# Patient Record
Sex: Male | Born: 1955 | Race: White | Hispanic: No | Smoking: Current every day smoker
Health system: Southern US, Community
[De-identification: ages and names within clinical notes are randomized; demographics above are authoritative.]

## PROBLEM LIST (undated history)

## (undated) DIAGNOSIS — B182 Chronic viral hepatitis C: Secondary | ICD-10-CM

## (undated) DIAGNOSIS — F329 Major depressive disorder, single episode, unspecified: Secondary | ICD-10-CM

## (undated) DIAGNOSIS — F419 Anxiety disorder, unspecified: Secondary | ICD-10-CM

## (undated) DIAGNOSIS — F32A Depression, unspecified: Secondary | ICD-10-CM

## (undated) DIAGNOSIS — H9190 Unspecified hearing loss, unspecified ear: Secondary | ICD-10-CM

## (undated) HISTORY — PX: NECK SURGERY: SHX720

---

## 2010-01-02 ENCOUNTER — Inpatient Hospital Stay (HOSPITAL_COMMUNITY): Admission: RE | Admit: 2010-01-02 | Discharge: 2010-01-03 | Payer: Self-pay | Admitting: Neurosurgery

## 2010-12-19 LAB — CBC
MCHC: 34.7 g/dL (ref 30.0–36.0)
MCV: 94.5 fL (ref 78.0–100.0)
Platelets: 270 10*3/uL (ref 150–400)
RBC: 4.32 MIL/uL (ref 4.22–5.81)
RDW: 12.7 % (ref 11.5–15.5)

## 2010-12-19 LAB — SURGICAL PCR SCREEN: MRSA, PCR: NEGATIVE

## 2012-11-08 ENCOUNTER — Emergency Department (HOSPITAL_BASED_OUTPATIENT_CLINIC_OR_DEPARTMENT_OTHER): Payer: Medicare Other

## 2012-11-08 ENCOUNTER — Emergency Department (HOSPITAL_BASED_OUTPATIENT_CLINIC_OR_DEPARTMENT_OTHER)
Admission: EM | Admit: 2012-11-08 | Discharge: 2012-11-08 | Disposition: A | Discharge status: Home / Self Care | Payer: Medicare Other | Attending: Emergency Medicine | Admitting: Emergency Medicine

## 2012-11-08 ENCOUNTER — Encounter (HOSPITAL_BASED_OUTPATIENT_CLINIC_OR_DEPARTMENT_OTHER): Payer: Self-pay | Admitting: *Deleted

## 2012-11-08 DIAGNOSIS — M542 Cervicalgia: Secondary | ICD-10-CM

## 2012-11-08 DIAGNOSIS — F17200 Nicotine dependence, unspecified, uncomplicated: Secondary | ICD-10-CM | POA: Insufficient documentation

## 2012-11-08 DIAGNOSIS — Z79899 Other long term (current) drug therapy: Secondary | ICD-10-CM | POA: Insufficient documentation

## 2012-11-08 DIAGNOSIS — H913 Deaf nonspeaking, not elsewhere classified: Secondary | ICD-10-CM | POA: Insufficient documentation

## 2012-11-08 DIAGNOSIS — G8929 Other chronic pain: Secondary | ICD-10-CM | POA: Insufficient documentation

## 2012-11-08 HISTORY — DX: Unspecified hearing loss, unspecified ear: H91.90

## 2012-11-08 MED ORDER — OXYCODONE-ACETAMINOPHEN 5-325 MG PO TABS: 2.0000 | ORAL_TABLET | ORAL | Status: DC | PRN

## 2012-11-08 MED ORDER — OXYCODONE-ACETAMINOPHEN 5-325 MG PO TABS
2.0000 | ORAL_TABLET | Freq: Once | ORAL | Status: AC
  Administered 2012-11-08: 2 via ORAL
  Filled 2012-11-08 (×2): qty 2

## 2012-11-08 NOTE — ED Notes (Signed)
Call from Nicoletta Dress requesting  Information as to fathers condition requesting return call at [phone]

## 2012-11-08 NOTE — ED Provider Notes (Signed)
History  This chart was scribed for Richardean Canal, MD by Shari Heritage, ED Scribe. The patient was seen in room MH06/MH06. Patient's care was started at 1942.   CSN: [id number]  Arrival date & time 11/08/12  4098   First MD Initiated Contact with Patient 11/08/12 1942      Chief Complaint  Patient presents with  . Neck Pain     The history is provided by the patient. The history is limited by the condition of the patient. No language interpreter was used.    HPI Comments - Level 5 Caveat, Unable to complete full history because patient is nonverbal: Lee [name] is a 57 y.o. male who presents to the Emergency Department complaining of moderate to severe, constant neck pain. Patient has a history of neck surgery and has had chronic pain since 2011. He has come to the ED now because he is out of Percocet and hydrocodone.   Past Medical History  Diagnosis Date  . Deaf     Past Surgical History  Procedure Laterality Date  . Neck surgery      History reviewed. No pertinent family history.  History  Substance Use Topics  . Smoking status: Current Every Day Smoker  . Smokeless tobacco: Not on file  . Alcohol Use: No      Review of Systems  Unable to perform ROS: Patient nonverbal    Allergies  Review of patient's allergies indicates no known allergies.  Home Medications   Current Outpatient Rx  Name  Route  Sig  Dispense  Refill  . ALPRAZolam (XANAX PO)   Oral   Take by mouth.         Marland Kitchen HYDROcodone-acetaminophen (NORCO/VICODIN) 5-325 MG per tablet   Oral   Take 1 tablet by mouth every 6 (six) hours as needed for pain.         Marland Kitchen oxyCODONE-acetaminophen (PERCOCET/ROXICET) 5-325 MG per tablet   Oral   Take 1 tablet by mouth every 4 (four) hours as needed for pain.           Triage Vitals: BP 125/93  Pulse 76  Temp(Src) 97.7 F (36.5 C) (Oral)  Resp 20  SpO2 96%  Physical Exam  Constitutional: He is oriented to person, place, and time. He appears  well-developed and well-nourished.  HENT:  Head: Normocephalic and atraumatic.  Eyes: Conjunctivae and EOM are normal. Pupils are equal, round, and reactive to light.  Neck: Normal range of motion. Neck supple.  No meningismus.   Cardiovascular: Normal rate, regular rhythm and normal heart sounds.   Pulmonary/Chest: Effort normal and breath sounds normal.  Musculoskeletal: Normal range of motion.  Moving all extremities without difficulty.  Neurological: He is alert and oriented to person, place, and time. Coordination normal.  Strength and sensation are intact.  Skin: Skin is warm and dry.  Psychiatric: He has a normal mood and affect. His behavior is normal.    ED Course  Procedures (including critical care time) DIAGNOSTIC STUDIES: Oxygen Saturation is 96% on room air, adequate by my interpretation.    COORDINATION OF CARE: 7:47 PM- Patient informed of current plan for treatment and evaluation and agrees with plan at this time.   8:00 PM- Ordered 2 tablets of Percocet 5-325 mg.  9:17 PM- Updated patient on imaging results. Will discharge with prescription for Percocet 5-325 mg.  Imaging Studies: Ct Cervical Spine Wo Contrast  11/08/2012  *RADIOLOGY REPORT*  Clinical Data: Neck pain.  CT CERVICAL SPINE  WITHOUT CONTRAST  Technique:  Multidetector CT imaging of the cervical spine was performed. Multiplanar CT image reconstructions were also generated.  Comparison: 04/21/2011  Findings: Spurring noted at the anterior C1-2 articulation.  Facet spurring noted bilaterally at C2-3 with degenerative facet arthropathy on the right at C7-T1.  Solid bony interbody fusion noted at C5-6 with anterior plate screw fixator.  Mild uncinate spurring noted bilaterally at C3-4 and C4-5, potentially contributing to mild osseous foraminal stenosis on the left at C3-4.  There is 1.5 mm of anterior subluxation of C7-T1.  This is thought to be degenerative.  No cervical spine fracture or acute subluxation  identified.  IMPRESSION:  1.  Cervical spondylosis without fracture or acute subluxation. 2.  Solid bony interbody fusion at C5-6. 3.  1.5 mm of degenerative anterior subluxation at C 71.   Original Report Authenticated By: Gaylyn Rong, M.D.      1. Neck pain       MDM  Lee [name] is a 57 y.o. male here with neck pain with radiation down to both arms. History difficult because he is deaf but he writes well. Nonfocal neuro exam. Felt better after percocet. CT neck showed no fracture or subluxation. Will d/c home with pain meds. He will f/u with PMD.    I personally performed the services described in this documentation, which was scribed in my presence. The recorded information has been reviewed and is accurate.    Richardean Canal, MD 11/08/12 2122

## 2012-11-08 NOTE — ED Notes (Signed)
Pt had neck surgery in 2011. Woke up a couple of days ago and his neck was hurting. Radiating down both arms.

## 2012-12-22 ENCOUNTER — Ambulatory Visit: Payer: Medicare Other | Admitting: Internal Medicine

## 2012-12-22 DIAGNOSIS — Z0289 Encounter for other administrative examinations: Secondary | ICD-10-CM

## 2014-10-17 ENCOUNTER — Emergency Department (HOSPITAL_BASED_OUTPATIENT_CLINIC_OR_DEPARTMENT_OTHER): Payer: PPO

## 2014-10-17 ENCOUNTER — Emergency Department (HOSPITAL_BASED_OUTPATIENT_CLINIC_OR_DEPARTMENT_OTHER)
Admission: EM | Admit: 2014-10-17 | Discharge: 2014-10-17 | Disposition: A | Discharge status: Home / Self Care | Payer: PPO | Attending: Emergency Medicine | Admitting: Emergency Medicine

## 2014-10-17 ENCOUNTER — Encounter (HOSPITAL_BASED_OUTPATIENT_CLINIC_OR_DEPARTMENT_OTHER): Payer: Self-pay

## 2014-10-17 DIAGNOSIS — S3992XA Unspecified injury of lower back, initial encounter: Secondary | ICD-10-CM | POA: Diagnosis not present

## 2014-10-17 DIAGNOSIS — M549 Dorsalgia, unspecified: Secondary | ICD-10-CM

## 2014-10-17 DIAGNOSIS — Z720 Tobacco use: Secondary | ICD-10-CM | POA: Insufficient documentation

## 2014-10-17 DIAGNOSIS — H9190 Unspecified hearing loss, unspecified ear: Secondary | ICD-10-CM | POA: Insufficient documentation

## 2014-10-17 DIAGNOSIS — Y9289 Other specified places as the place of occurrence of the external cause: Secondary | ICD-10-CM | POA: Insufficient documentation

## 2014-10-17 DIAGNOSIS — Z79899 Other long term (current) drug therapy: Secondary | ICD-10-CM | POA: Diagnosis not present

## 2014-10-17 DIAGNOSIS — G8929 Other chronic pain: Secondary | ICD-10-CM | POA: Insufficient documentation

## 2014-10-17 DIAGNOSIS — X58XXXA Exposure to other specified factors, initial encounter: Secondary | ICD-10-CM | POA: Diagnosis not present

## 2014-10-17 DIAGNOSIS — Y998 Other external cause status: Secondary | ICD-10-CM | POA: Insufficient documentation

## 2014-10-17 DIAGNOSIS — Y9389 Activity, other specified: Secondary | ICD-10-CM | POA: Insufficient documentation

## 2014-10-17 MED ORDER — OXYCODONE-ACETAMINOPHEN 5-325 MG PO TABS: 1.0000 | ORAL_TABLET | Freq: Four times a day (QID) | ORAL | Status: DC | PRN

## 2014-10-17 MED ORDER — OXYCODONE-ACETAMINOPHEN 5-325 MG PO TABS
2.0000 | ORAL_TABLET | Freq: Once | ORAL | Status: AC
  Administered 2014-10-17: 2 via ORAL
  Filled 2014-10-17: qty 2

## 2014-10-17 NOTE — ED Provider Notes (Signed)
CSN: [id number]     Arrival date & time 10/17/14  1006 History   First MD Initiated Contact with Patient 10/17/14 1020     Chief Complaint  Patient presents with  . Back Pain     (Consider location/radiation/quality/duration/timing/severity/associated sxs/prior Treatment) Patient is a 59 y.o. male presenting with back pain.  Back Pain Location:  Lumbar spine Quality:  Aching Radiates to:  Does not radiate Pain severity:  Severe Onset quality:  Gradual Duration:  3 months Timing:  Constant Progression:  Worsening Chronicity:  New Context comment:  About 3-4 months ago, he was pushing a moped which started his pain.  then he lifted heavy boxes at church, which exacerbated it.  3 Relieved by:  Narcotics Worsened by:  Movement and twisting (lying flat) Associated symptoms: bladder incontinence (once, 3-4 months ago, none since.), numbness (persistent for 3-4 months, but improved from initial.) and weakness (initially, but this has resolved over past few months)   Associated symptoms: no abdominal pain, no bowel incontinence, no fever and no perianal numbness     Past Medical History  Diagnosis Date  . Deaf    Past Surgical History  Procedure Laterality Date  . Neck surgery     No family history on file. History  Substance Use Topics  . Smoking status: Current Every Day Smoker  . Smokeless tobacco: Not on file  . Alcohol Use: No    Review of Systems  Constitutional: Negative for fever.  Gastrointestinal: Negative for abdominal pain and bowel incontinence.  Genitourinary: Positive for bladder incontinence (once, 3-4 months ago, none since.).  Musculoskeletal: Positive for back pain.  Neurological: Positive for weakness (initially, but this has resolved over past few months) and numbness (persistent for 3-4 months, but improved from initial.).  All other systems reviewed and are negative.     Allergies  Review of patient's allergies indicates no known  allergies.  Home Medications   Prior to Admission medications   Medication Sig Start Date End Date Taking? Authorizing Provider  ALPRAZolam (XANAX PO) Take by mouth.    Historical Provider, MD  HYDROcodone-acetaminophen (NORCO/VICODIN) 5-325 MG per tablet Take 1 tablet by mouth every 6 (six) hours as needed for pain.    Historical Provider, MD  oxyCODONE-acetaminophen (PERCOCET) 5-325 MG per tablet Take 2 tablets by mouth every 4 (four) hours as needed for pain. 11/08/12   Richardean Canalavid H Yao, MD  oxyCODONE-acetaminophen (PERCOCET/ROXICET) 5-325 MG per tablet Take 1 tablet by mouth every 4 (four) hours as needed for pain.    Historical Provider, MD   BP 179/96 mmHg  Pulse 83  Temp(Src) 98.1 F (36.7 C) (Oral)  Resp 18  SpO2 99% Physical Exam  Constitutional: He is oriented to person, place, and time. He appears well-developed and well-nourished. No distress.  HENT:  Head: Normocephalic and atraumatic.  Eyes: Conjunctivae are normal. No scleral icterus.  Neck: Neck supple.  Cardiovascular: Normal rate and intact distal pulses.   Pulmonary/Chest: Effort normal. No stridor. No respiratory distress.  Abdominal: Normal appearance. He exhibits no distension. There is no tenderness. There is no rebound and no guarding.  Musculoskeletal:       Lumbar back: He exhibits tenderness.       Back:  Neurological: He is alert and oriented to person, place, and time. No sensory deficit.  Normal lower extremity strength.  Slightly stooped gait, no ataxia.  Skin: Skin is warm and dry. No rash noted.  Psychiatric: He has a normal mood and affect. His  behavior is normal.  Nursing note and vitals reviewed.   ED Course  Procedures (including critical care time) Labs Review Labs Reviewed - No data to display  Imaging Review Dg Lumbar Spine Complete  10/17/2014   CLINICAL DATA:  Three-month history of low back pain with radicular symptoms bilaterally  EXAM: LUMBAR SPINE - COMPLETE 4+ VIEW  COMPARISON:   Lumbar radiographs October 21, 2011; lumbar CT July 29, 2014 ; lumbar MRI August 16, 2014  FINDINGS: Frontal, lateral, spot lumbosacral lateral, and bilateral oblique views were obtained. There are 5 non-rib-bearing lumbar type vertebral bodies. There is no fracture or spondylolisthesis. There is moderate disc space narrowing at L3-4, L4-5, and L5-S1, increased from prior studies. There is facet osteoarthritic change at L5-S1 bilaterally.  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 No fracture or spondylolisthesis.   Electronically Signed   By: Bretta Bang M.D.   On: 10/17/2014 12:17  All radiology studies independently viewed by me.      EKG Interpretation None      MDM   Final diagnoses:  Back pain   Initial history obtained through pen and paper.  Then, additional history obtained via sign language interpretor.    59 yo male with subacute/chronic back pain for past 3-4 months who presents to the ED from his doctor's office after they were unable to evaluate him at his regularly scheduled appointment due to a language barrier.  He has persistent back pain for several months.  Initially, he had some weakness and an episode of incontinence.  Since then, he has had an MRI which did not show any cord or nerve compression.  Percocet has helped his pain, but he does not have anymore.  It seems reasonable under the circumstances, to continue to treat his pain until he can follow up with his doctors.  He has an appointment in a few weeks.   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  Advised close PCP follow up for further imaging.       Candyce Churn III, MD 10/17/14 [phone]

## 2014-10-17 NOTE — ED Notes (Signed)
Lee [name] with communications returned my call and will call back with an ETA for a sign language interpreter.

## 2014-10-17 NOTE — ED Notes (Signed)
MD at bedside. 

## 2014-10-17 NOTE — ED Notes (Signed)
Translator here at this time.

## 2014-10-17 NOTE — Discharge Instructions (Signed)
Back Pain, Adult °Low back pain is very common. About 1 in 5 people have back pain. The cause of low back pain is rarely dangerous. The pain often gets better over time. About half of people with a sudden onset of back pain feel better in just 2 weeks. About 8 in 10 people feel better by 6 weeks.  °CAUSES °Some common causes of back pain include: °· Strain of the muscles or ligaments supporting the spine. °· Wear and tear (degeneration) of the spinal discs. °· Arthritis. °· Direct injury to the back. °DIAGNOSIS °Most of the time, the direct cause of low back pain is not known. However, back pain can be treated effectively even when the exact cause of the pain is unknown. Answering your caregiver's questions about your overall health and symptoms is one of the most accurate ways to make sure the cause of your pain is not dangerous. If your caregiver needs more information, he or she may order lab work or imaging tests (X-rays or MRIs). However, even if imaging tests show changes in your back, this usually does not require surgery. °HOME CARE INSTRUCTIONS °For many people, back pain returns. Since low back pain is rarely dangerous, it is often a condition that people can learn to manage on their own.  °· Remain active. It is stressful on the back to sit or stand in one place. Do not sit, drive, or stand in one place for more than 30 minutes at a time. Take short walks on level surfaces as soon as pain allows. Try to increase the length of time you walk each day. °· Do not stay in bed. Resting more than 1 or 2 days can delay your recovery. °· Do not avoid exercise or work. Your body is made to move. It is not dangerous to be active, even though your back may hurt. Your back will likely heal faster if you return to being active before your pain is gone. °· Pay attention to your body when you  bend and lift. Many people have less discomfort when lifting if they bend their knees, keep the load close to their bodies, and  avoid twisting. Often, the most comfortable positions are those that put less stress on your recovering back. °· Find a comfortable position to sleep. Use a firm mattress and lie on your side with your knees slightly bent. If you lie on your back, put a pillow under your knees. °· Only take over-the-counter or prescription medicines as directed by your caregiver. Over-the-counter medicines to reduce pain and inflammation are often the most helpful. Your caregiver may prescribe muscle relaxant drugs. These medicines help dull your pain so you can more quickly return to your normal activities and healthy exercise. °· Put ice on the injured area. °¨ Put ice in a plastic bag. °¨ Place a towel between your skin and the bag. °¨ Leave the ice on for 15-20 minutes, 03-04 times a day for the first 2 to 3 days. After that, ice and heat may be alternated to reduce pain and spasms. °· Ask your caregiver about trying back exercises and gentle massage. This may be of some benefit. °· Avoid feeling anxious or stressed. Stress increases muscle tension and can worsen back pain. It is important to recognize when you are anxious or stressed and learn ways to manage it. Exercise is a great option. °SEEK MEDICAL CARE IF: °· You have pain that is not relieved with rest or medicine. °· You have pain that does not improve in 1 week. °· You have new symptoms. °· You are generally not feeling well. °SEEK   IMMEDIATE MEDICAL CARE IF:  °· You have pain that radiates from your back into your legs. °· You develop new bowel or bladder control problems. °· You have unusual weakness or numbness in your arms or legs. °· You develop nausea or vomiting. °· You develop abdominal pain. °· You feel faint. °Document Released: 09/16/2005 Document Revised: 03/17/2012 Document Reviewed: 01/18/2014 °ExitCare® Patient Information ©2015 [employer]. This information is not intended to replace advice given to you by your health care provider. Make sure you  discuss any questions you have with your health care provider. ° °Back Exercises °Back exercises help treat and prevent back injuries. The goal of back exercises is to increase the strength of your abdominal and back muscles and the flexibility of your back. These exercises should be started when you no longer have back pain. Back exercises include: °· Pelvic Tilt. Lie on your back with your knees bent. Tilt your pelvis until the lower part of your back is against the floor. Hold this position 5 to 10 sec and repeat 5 to 10 times. °· Knee to Chest. Pull first 1 knee up against your chest and hold for 20 to 30 seconds, repeat this with the other knee, and then both knees. This may be done with the other leg straight or bent, whichever feels better. °· Sit-Ups or Curl-Ups. Bend your knees 90 degrees. Start with tilting your pelvis, and do a partial, slow sit-up, lifting your trunk only 30 to 45 degrees off the floor. Take at least 2 to 3 seconds for each sit-up. Do not do sit-ups with your knees out straight. If partial sit-ups are difficult, simply do the above but with only tightening your abdominal muscles and holding it as directed. °· Hip-Lift. Lie on your back with your knees flexed 90 degrees. Push down with your feet and shoulders as you raise your hips a couple inches off the floor; hold for 10 seconds, repeat 5 to 10 times. °· Back arches. Lie on your stomach, propping yourself up on bent elbows. Slowly press on your hands, causing an arch in your low back. Repeat 3 to 5 times. Any initial stiffness and discomfort should lessen with repetition over time. °· Shoulder-Lifts. Lie face down with arms beside your body. Keep hips and torso pressed to floor as you slowly lift your head and shoulders off the floor. °Do not overdo your exercises, especially in the beginning. Exercises may cause you some mild back discomfort which lasts for a few minutes; however, if the pain is more severe, or lasts for more than 15  minutes, do not continue exercises until you see your caregiver. Improvement with exercise therapy for back problems is slow.  °See your caregivers for assistance with developing a proper back exercise program. °Document Released: 10/24/2004 Document Revised: 12/09/2011 Document Reviewed: 07/18/2011 °ExitCare® Patient Information ©2015 [employer]. This information is not intended to replace advice given to you by your health care provider. Make sure you discuss any questions you have with your health care provider. ° °

## 2014-10-17 NOTE — ED Notes (Signed)
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 Person with pt is also mute.  Paper and pen given for pt to write down complaint.

## 2014-10-17 NOTE — ED Notes (Signed)
Pt with family at bedside. Sts that pt has had back pain x 3 months. Went to PMD today for back pain, office didn't have interpreter, so sent the patient to the ED for eval. Reports continued pain. Denies any new pain.

## 2014-10-17 NOTE — ED Notes (Signed)
[facility] and was given communications number for a sign language interpreter. Left message.

## 2014-12-19 ENCOUNTER — Encounter (HOSPITAL_COMMUNITY): Payer: Self-pay | Admitting: Family Medicine

## 2014-12-19 ENCOUNTER — Emergency Department (HOSPITAL_COMMUNITY): Payer: PPO

## 2014-12-19 ENCOUNTER — Emergency Department (HOSPITAL_COMMUNITY)
Admission: EM | Admit: 2014-12-19 | Discharge: 2014-12-19 | Disposition: A | Discharge status: Home / Self Care | Payer: PPO | Attending: Emergency Medicine | Admitting: Emergency Medicine

## 2014-12-19 DIAGNOSIS — S91331A Puncture wound without foreign body, right foot, initial encounter: Secondary | ICD-10-CM | POA: Insufficient documentation

## 2014-12-19 DIAGNOSIS — W228XXA Striking against or struck by other objects, initial encounter: Secondary | ICD-10-CM | POA: Diagnosis not present

## 2014-12-19 DIAGNOSIS — Y939 Activity, unspecified: Secondary | ICD-10-CM | POA: Diagnosis not present

## 2014-12-19 DIAGNOSIS — H9190 Unspecified hearing loss, unspecified ear: Secondary | ICD-10-CM | POA: Diagnosis not present

## 2014-12-19 DIAGNOSIS — Y929 Unspecified place or not applicable: Secondary | ICD-10-CM | POA: Insufficient documentation

## 2014-12-19 DIAGNOSIS — M79671 Pain in right foot: Secondary | ICD-10-CM

## 2014-12-19 DIAGNOSIS — Z720 Tobacco use: Secondary | ICD-10-CM | POA: Insufficient documentation

## 2014-12-19 DIAGNOSIS — Y999 Unspecified external cause status: Secondary | ICD-10-CM | POA: Insufficient documentation

## 2014-12-19 DIAGNOSIS — S99921A Unspecified injury of right foot, initial encounter: Secondary | ICD-10-CM | POA: Diagnosis present

## 2014-12-19 MED ORDER — CIPROFLOXACIN HCL 500 MG PO TABS: 500.0000 mg | ORAL_TABLET | Freq: Two times a day (BID) | ORAL | Status: DC

## 2014-12-19 MED ORDER — TRAMADOL HCL 50 MG PO TABS
50.0000 mg | ORAL_TABLET | Freq: Four times a day (QID) | ORAL | Status: DC | PRN
Start: 2014-12-19 — End: 2015-12-05

## 2014-12-19 NOTE — ED Notes (Signed)
Sign lang interpreter requested

## 2014-12-19 NOTE — ED Notes (Signed)
Pt reports pain to foot when walking . Pt does not remember if he stepped on anything. Pt states you can check my shoe. Pt shoe had a large nail stuck to bottom of shoe.

## 2014-12-19 NOTE — ED Notes (Signed)
Pt made aware to return if symptoms worsen or if any life threatening symptoms occur.   

## 2014-12-19 NOTE — ED Notes (Addendum)
Pt here for foreign body in right heel of foot.

## 2014-12-19 NOTE — ED Provider Notes (Signed)
CSN: [id number]     Arrival date & time 12/19/14  1725 History  This chart was scribed for a non-physician practitioner, Junius Finner, PA-C working with Rolan Bucco, MD by [place] Peace, ED Scribe. The patient was seen in [facility]. The patient's care was started at 6:54 PM.     Chief Complaint  Patient presents with  . Foot Pain      Patient is a 59 y.o. male presenting with lower extremity pain. The history is provided by the patient. A language interpreter was used Materials engineer).  Foot Pain    HPI Comments: Lee [name] is a 58 y.o. male who presents to the Emergency Department complaining of radiating right foot pain that extends upward into his leg. Pt states he has been experiencing pain in his foot while ambulating but was not sure why pain kept occurring. Nurse here at ED discovered pt had nail stuck in the bottom of his shoe which was responsible for pt's pain. He denies any incidents of stepping on anything to his knowledge. Pt is deaf. Pt is current everyday smoker.   Past Medical History  Diagnosis Date  . Deaf    Past Surgical History  Procedure Laterality Date  . Neck surgery     History reviewed. No pertinent family history. History  Substance Use Topics  . Smoking status: Current Every Day Smoker  . Smokeless tobacco: Not on file  . Alcohol Use: No    Review of Systems  Musculoskeletal:       Right foot pain.   Skin: Positive for wound.  All other systems reviewed and are negative.     Allergies  Review of patient's allergies indicates no known allergies.  Home Medications   Prior to Admission medications   Medication Sig Start Date End Date Taking? Authorizing Provider  ALPRAZolam (XANAX PO) Take by mouth.    Historical Provider, MD  HYDROcodone-acetaminophen (NORCO/VICODIN) 5-325 MG per tablet Take 1 tablet by mouth every 6 (six) hours as needed for pain.    Historical Provider, MD  oxyCODONE-acetaminophen (PERCOCET) 5-325 MG per tablet  Take 1-2 tablets by mouth every 6 (six) hours as needed for severe pain. 10/17/14   Blake Divine, MD   BP 142/66 mmHg  Pulse 75  Temp(Src) 98.2 F (36.8 C) (Oral)  Resp 18  SpO2 98% Physical Exam  Constitutional: He is oriented to person, place, and time. He appears well-developed and well-nourished.  HENT:  Head: Normocephalic and atraumatic.  Eyes: EOM are normal.  Neck: Normal range of motion.  Cardiovascular: Normal rate.   Pulses:      Dorsalis pedis pulses are 2+ on the right side.  Pulmonary/Chest: Effort normal.  Musculoskeletal: Normal range of motion. He exhibits tenderness. He exhibits no edema.  Right heel: tenderness on bottom of heel, see skin exam  Neurological: He is alert and oriented to person, place, and time.  Skin: Skin is warm and dry. No erythema.  Puncture wound with surrounding callous skin. No active bleeding or discharge. No foreign body seen or palpated.  Psychiatric: He has a normal mood and affect. His behavior is normal.  Nursing note and vitals reviewed.   ED Course  Procedures (including critical care time) Labs Review Labs Reviewed - No data to display  Imaging Review Dg Foot Complete Right  12/19/2014   CLINICAL DATA:  Puncture wound of the right foot under the calcaneus 3 days ago  EXAM: RIGHT FOOT COMPLETE - 3+ VIEW  COMPARISON:  None.  FINDINGS: There is no evidence of fracture or dislocation. There is mild osteoarthritis of the first MTP joint. Soft tissues are unremarkable.  IMPRESSION: 1. No acute osseous abnormality of the right foot. 2. No radiopaque foreign body.   Electronically Signed   By: Elige KoHetal  Patel   On: 12/19/2014 18:45     EKG Interpretation None     Medications - No data to display  7:00 PM- Treatment plan was discussed with patient who verbalizes understanding and agrees.   MDM   Final diagnoses:  None    Pt presenting to ED with puncture wound to bottom of right heel.  No foreign body seen on exam or on imaging.  Pt does have a nail that he found stuck into the bottom of his shoe, likely source of pain.  Wound cleaned and bandaged in ED. Will tx with 5 day course of Cipro. Home care instructions provided. Advised to f/u with PCP in 3-4 days for recheck of symptoms if not improving. Return precautions provided. Pt verbalized understanding and agreement with tx plan.    I personally performed the services described in this documentation, which was scribed in my presence. The recorded information has been reviewed and is accurate.    Junius Finnerrin O'Malley, PA-C 12/19/14 1947  Rolan BuccoMelanie Belfi, MD 12/20/14 [phone]

## 2015-01-16 ENCOUNTER — Emergency Department (HOSPITAL_COMMUNITY)
Admission: EM | Admit: 2015-01-16 | Discharge: 2015-01-16 | Disposition: A | Discharge status: Home / Self Care | Payer: PPO | Attending: Emergency Medicine | Admitting: Emergency Medicine

## 2015-01-16 ENCOUNTER — Encounter (HOSPITAL_COMMUNITY): Payer: Self-pay | Admitting: Emergency Medicine

## 2015-01-16 DIAGNOSIS — Y998 Other external cause status: Secondary | ICD-10-CM | POA: Insufficient documentation

## 2015-01-16 DIAGNOSIS — X58XXXA Exposure to other specified factors, initial encounter: Secondary | ICD-10-CM | POA: Insufficient documentation

## 2015-01-16 DIAGNOSIS — Z79899 Other long term (current) drug therapy: Secondary | ICD-10-CM | POA: Diagnosis not present

## 2015-01-16 DIAGNOSIS — S3991XA Unspecified injury of abdomen, initial encounter: Secondary | ICD-10-CM | POA: Diagnosis present

## 2015-01-16 DIAGNOSIS — Y9289 Other specified places as the place of occurrence of the external cause: Secondary | ICD-10-CM | POA: Insufficient documentation

## 2015-01-16 DIAGNOSIS — Z720 Tobacco use: Secondary | ICD-10-CM | POA: Insufficient documentation

## 2015-01-16 DIAGNOSIS — S39011A Strain of muscle, fascia and tendon of abdomen, initial encounter: Secondary | ICD-10-CM | POA: Insufficient documentation

## 2015-01-16 DIAGNOSIS — H913 Deaf nonspeaking, not elsewhere classified: Secondary | ICD-10-CM | POA: Diagnosis not present

## 2015-01-16 DIAGNOSIS — Z792 Long term (current) use of antibiotics: Secondary | ICD-10-CM | POA: Diagnosis not present

## 2015-01-16 DIAGNOSIS — Y9389 Activity, other specified: Secondary | ICD-10-CM | POA: Insufficient documentation

## 2015-01-16 MED ORDER — OXYCODONE-ACETAMINOPHEN 5-325 MG PO TABS
2.0000 | ORAL_TABLET | Freq: Once | ORAL | Status: AC
  Administered 2015-01-16: 2 via ORAL
  Filled 2015-01-16: qty 2

## 2015-01-16 MED ORDER — OXYCODONE-ACETAMINOPHEN 5-325 MG PO TABS: 1.0000 | ORAL_TABLET | Freq: Four times a day (QID) | ORAL | Status: DC | PRN

## 2015-01-16 NOTE — ED Notes (Addendum)
Per ems pt comes from home c/o groin pain and right leg numbnes.  Pt wrote on note stating that he lifted something heavy.  Pt is deaf.  Pt ambulated to triage room from ambulance with steady gait.

## 2015-01-16 NOTE — Discharge Instructions (Signed)
You appear to have an inguinal (groin) hernia. If you notice bulging that does not go down, increased pain, or uncontrolled vomiting, come back to the ER for evaluation. Otherwise follow up with the surgeons above for evaluation of possible surgical repair.   Inguinal Hernia, Adult Muscles help keep everything in the body in its proper place. But if a weak spot in the muscles develops, something can poke through. That is called a hernia. When this happens in the lower part of the belly (abdomen), it is called an inguinal hernia. (It takes its name from a part of the body in this region called the inguinal canal.) A weak spot in the wall of muscles lets some fat or part of the small intestine bulge through. An inguinal hernia can develop at any age. Men get them more often than women. CAUSES  In adults, an inguinal hernia develops over time.  It can be triggered by:  Suddenly straining the muscles of the lower abdomen.  Lifting heavy objects.  Straining to have a bowel movement. Difficult bowel movements (constipation) can lead to this.  Constant coughing. This may be caused by smoking or lung disease.  Being overweight.  Being pregnant.  Working at a job that requires long periods of standing or heavy lifting.  Having had an inguinal hernia before. One type can be an emergency situation. It is called a strangulated inguinal hernia. It develops if part of the small intestine slips through the weak spot and cannot get back into the abdomen. The blood supply can be cut off. If that happens, part of the intestine may die. This situation requires emergency surgery. SYMPTOMS  Often, a small inguinal hernia has no symptoms. It is found when a healthcare provider does a physical exam. Larger hernias usually have symptoms.   In adults, symptoms may include:  A lump in the groin. This is easier to see when the person is standing. It might disappear when lying down.  In men, a lump in the  scrotum.  Pain or burning in the groin. This occurs especially when lifting, straining or coughing.  A dull ache or feeling of pressure in the groin.  Signs of a strangulated hernia can include:  A bulge in the groin that becomes very painful and tender to the touch.  A bulge that turns red or purple.  Fever, nausea and vomiting.  Inability to have a bowel movement or to pass gas. DIAGNOSIS  To decide if you have an inguinal hernia, a healthcare provider will probably do a physical examination.  This will include asking questions about any symptoms you have noticed.  The healthcare provider might feel the groin area and ask you to cough. If an inguinal hernia is felt, the healthcare provider may try to slide it back into the abdomen.  Usually no other tests are needed. TREATMENT  Treatments can vary. The size of the hernia makes a difference. Options include:  Watchful waiting. This is often suggested if the hernia is small and you have had no symptoms.  No medical procedure will be done unless symptoms develop.  You will need to watch closely for symptoms. If any occur, contact your healthcare provider right away.  Surgery. This is used if the hernia is larger or you have symptoms.  Open surgery. This is usually an outpatient procedure (you will not stay overnight in a hospital). An cut (incision) is made through the skin in the groin. The hernia is put back inside the abdomen.  The weak area in the muscles is then repaired by herniorrhaphy or hernioplasty. Herniorrhaphy: in this type of surgery, the weak muscles are sewn back together. Hernioplasty: a patch or mesh is used to close the weak area in the abdominal wall.  Laparoscopy. In this procedure, a surgeon makes small incisions. A thin tube with a tiny video camera (called a laparoscope) is put into the abdomen. The surgeon repairs the hernia with mesh by looking with the video camera and using two long instruments. HOME  CARE INSTRUCTIONS   After surgery to repair an inguinal hernia:  You will need to take pain medicine prescribed by your healthcare provider. Follow all directions carefully.  You will need to take care of the wound from the incision.  Your activity will be restricted for awhile. This will probably include no heavy lifting for several weeks. You also should not do anything too active for a few weeks. When you can return to work will depend on the type of job that you have.  During "watchful waiting" periods, you should:  Maintain a healthy weight.  Eat a diet high in fiber (fruits, vegetables and whole grains).  Drink plenty of fluids to avoid constipation. This means drinking enough water and other liquids to keep your urine clear or pale yellow.  Do not lift heavy objects.  Do not stand for long periods of time.  Quit smoking. This should keep you from developing a frequent cough. SEEK MEDICAL CARE IF:   A bulge develops in your groin area.  You feel pain, a burning sensation or pressure in the groin. This might be worse if you are lifting or straining.  You develop a fever of more than 100.5 F (38.1 C). SEEK IMMEDIATE MEDICAL CARE IF:   Pain in the groin increases suddenly.  A bulge in the groin gets bigger suddenly and does not go down.  For men, there is sudden pain in the scrotum. Or, the size of the scrotum increases.  A bulge in the groin area becomes red or purple and is painful to touch.  You have nausea or vomiting that does not go away.  You feel your heart beating much faster than normal.  You cannot have a bowel movement or pass gas.  You develop a fever of more than 102.0 F (38.9 C). Document Released: 02/02/2009 Document Revised: 12/09/2011 Document Reviewed: 02/02/2009 [facility] Patient Information 2015 [place], [place]. This information is not intended to replace advice given to you by your health care provider. Make sure you discuss any questions  you have with your health care provider.   Muscle Strain A muscle strain is an injury that occurs when a muscle is stretched beyond its normal length. Usually a small number of muscle fibers are torn when this happens. Muscle strain is rated in degrees. First-degree strains have the least amount of muscle fiber tearing and pain. Second-degree and third-degree strains have increasingly more tearing and pain.  Usually, recovery from muscle strain takes 1-2 weeks. Complete healing takes 5-6 weeks.  CAUSES  Muscle strain happens when a sudden, violent force placed on a muscle stretches it too far. This may occur with lifting, sports, or a fall.  RISK FACTORS Muscle strain is especially common in athletes.  SIGNS AND SYMPTOMS At the site of the muscle strain, there may be:  Pain.  Bruising.  Swelling.  Difficulty using the muscle due to pain or lack of normal function. DIAGNOSIS  Your health care provider will perform a  physical exam and ask about your medical history. TREATMENT  Often, the best treatment for a muscle strain is resting, icing, and applying cold compresses to the injured area.  HOME CARE INSTRUCTIONS   Use the PRICE method of treatment to promote muscle healing during the first 2-3 days after your injury. The PRICE method involves:  Protecting the muscle from being injured again.  Restricting your activity and resting the injured body part.  Icing your injury. To do this, put ice in a plastic bag. Place a towel between your skin and the bag. Then, apply the ice and leave it on from 15-20 minutes each hour. After the third day, switch to moist heat packs.  Apply compression to the injured area with a splint or elastic bandage. Be careful not to wrap it too tightly. This may interfere with blood circulation or increase swelling.  Elevate the injured body part above the level of your heart as often as you can.  Only take over-the-counter or prescription medicines for  pain, discomfort, or fever as directed by your health care provider.  Warming up prior to exercise helps to prevent future muscle strains. SEEK MEDICAL CARE IF:   You have increasing pain or swelling in the injured area.  You have numbness, tingling, or a significant loss of strength in the injured area. MAKE SURE YOU:   Understand these instructions.  Will watch your condition.  Will get help right away if you are not doing well or get worse. Document Released: 09/16/2005 Document Revised: 07/07/2013 Document Reviewed: 04/15/2013 [facility] Patient Information 2015 [place], [place]. This information is not intended to replace advice given to you by your health care provider. Make sure you discuss any questions you have with your health care provider.

## 2015-01-16 NOTE — ED Notes (Signed)
Bed: WA03 Expected date:  Expected time:  Means of arrival:  Comments: Triage 1 

## 2015-01-16 NOTE — ED Provider Notes (Signed)
CSN: [id number]     Arrival date & time 01/16/15  1634 History   First MD Initiated Contact with Patient 01/16/15 1727     Chief Complaint  Patient presents with  . Groin Pain  . leg numbness     right     (Consider location/radiation/quality/duration/timing/severity/associated sxs/prior Treatment) HPI  59 year old male who is deaf presents with bilateral groin pain that started after lifting heavy boxes and moving a motorcycle. History obtained with help of sign language interpreter. He states while straining he noticed an immediate pain in his groin. Has noticed some bulging and swelling. Rates his pain as severe. No direct trauma to his abdomen. Has not taken anything for the pain. Also complains of right lateral leg numbness that he states has been ongoing for several months. He shows me an MRI from November 2015 that notes a right L5 foraminal narrowing causing a pinched nerve. This numbness is not new and no weakness. Able to ambulate but is painful due to his groin.   Past Medical History  Diagnosis Date  . Deaf    Past Surgical History  Procedure Laterality Date  . Neck surgery     No family history on file. History  Substance Use Topics  . Smoking status: Current Every Day Smoker  . Smokeless tobacco: Not on file  . Alcohol Use: Yes    Review of Systems  Gastrointestinal: Positive for abdominal pain. Negative for nausea and vomiting.  Neurological: Positive for numbness. Negative for weakness.  All other systems reviewed and are negative.     Allergies  Review of patient's allergies indicates no known allergies.  Home Medications   Prior to Admission medications   Medication Sig Start Date End Date Taking? Authorizing Provider  ALPRAZolam (XANAX PO) Take by mouth.    Historical Provider, MD  ciprofloxacin (CIPRO) 500 MG tablet Take 1 tablet (500 mg total) by mouth 2 (two) times daily. 12/19/14   Junius Finner, PA-C  HYDROcodone-acetaminophen (NORCO/VICODIN)  5-325 MG per tablet Take 1 tablet by mouth every 6 (six) hours as needed for pain.    Historical Provider, MD  oxyCODONE-acetaminophen (PERCOCET) 5-325 MG per tablet Take 1-2 tablets by mouth every 6 (six) hours as needed for severe pain. 10/17/14   Blake Divine, MD  oxyCODONE-acetaminophen (PERCOCET) 5-325 MG per tablet Take 1-2 tablets by mouth every 6 (six) hours as needed for severe pain. 01/16/15   Pricilla Loveless, MD  traMADol (ULTRAM) 50 MG tablet Take 1 tablet (50 mg total) by mouth every 6 (six) hours as needed. 12/19/14   Junius Finner, PA-C   BP 142/93 mmHg  Pulse 76  Temp(Src) 97.2 F (36.2 C) (Oral)  Resp 16  SpO2 100% Physical Exam  Constitutional: He is oriented to person, place, and time. He appears well-developed and well-nourished.  HENT:  Head: Normocephalic and atraumatic.  Right Ear: External ear normal.  Left Ear: External ear normal.  Nose: Nose normal.  Eyes: Right eye exhibits no discharge. Left eye exhibits no discharge.  Neck: Neck supple.  Cardiovascular: Normal rate, regular rhythm, normal heart sounds and intact distal pulses.   Pulmonary/Chest: Effort normal and breath sounds normal.  Abdominal: Soft. He exhibits no distension. Hernia confirmed negative in the right inguinal area and confirmed negative in the left inguinal area.  Genitourinary:     Musculoskeletal: He exhibits no edema.  Neurological: He is alert and oriented to person, place, and time.  Skin: Skin is warm and dry.  Nursing note and  vitals reviewed.   ED Course  Procedures (including critical care time) Labs Review Labs Reviewed - No data to display  Imaging Review No results found.   EKG Interpretation None      MDM   Final diagnoses:  Abdominal wall strain, initial encounter    Patient with immediate lower abdominal/groin pain after heavy lifting. History is consistent with a likely inguinal hernia although I'm not able to palpate a specific defect. No focal bulging or  concern for incarcerated hernia. Pain does worsen with certain movements and coughing. Patient is well-appearing and has no vomiting. Will treat his pain with oral narcotics and recommend follow-up with general surgery for outpatient repair if indicated. At this point I discussed strict return precautions for incarceration or other concerns. As for his numbness this appears to be a chronic issue is not worse now, but patient wanted his MRI explained to him. I went over the MRI report which shows L5 nerve root compression peripherally from November 2015. It also mentions a 3.1 cm AAA which I went over what that means. I highly doubt this is related to his AAA without any distention, pulsatile mass, and normal pulses. Stable for discharge.     Pricilla LovelessScott Laurinda Carreno, MD 01/16/15 [phone]

## 2015-11-14 ENCOUNTER — Encounter (HOSPITAL_COMMUNITY): Payer: Self-pay | Admitting: Emergency Medicine

## 2015-11-14 ENCOUNTER — Emergency Department (HOSPITAL_COMMUNITY)
Admission: EM | Admit: 2015-11-14 | Discharge: 2015-11-15 | Disposition: A | Discharge status: Other Acute Inpatient Hospital | Payer: PPO | Attending: Emergency Medicine | Admitting: Emergency Medicine

## 2015-11-14 DIAGNOSIS — Z792 Long term (current) use of antibiotics: Secondary | ICD-10-CM | POA: Insufficient documentation

## 2015-11-14 DIAGNOSIS — Y908 Blood alcohol level of 240 mg/100 ml or more: Secondary | ICD-10-CM | POA: Diagnosis not present

## 2015-11-14 DIAGNOSIS — F329 Major depressive disorder, single episode, unspecified: Secondary | ICD-10-CM | POA: Diagnosis not present

## 2015-11-14 DIAGNOSIS — M25572 Pain in left ankle and joints of left foot: Secondary | ICD-10-CM | POA: Diagnosis not present

## 2015-11-14 DIAGNOSIS — F1010 Alcohol abuse, uncomplicated: Secondary | ICD-10-CM | POA: Insufficient documentation

## 2015-11-14 DIAGNOSIS — Z79899 Other long term (current) drug therapy: Secondary | ICD-10-CM | POA: Insufficient documentation

## 2015-11-14 DIAGNOSIS — F1020 Alcohol dependence, uncomplicated: Secondary | ICD-10-CM | POA: Diagnosis not present

## 2015-11-14 DIAGNOSIS — T1491 Suicide attempt: Secondary | ICD-10-CM | POA: Diagnosis not present

## 2015-11-14 DIAGNOSIS — F17200 Nicotine dependence, unspecified, uncomplicated: Secondary | ICD-10-CM | POA: Insufficient documentation

## 2015-11-14 DIAGNOSIS — H9190 Unspecified hearing loss, unspecified ear: Secondary | ICD-10-CM | POA: Insufficient documentation

## 2015-11-14 DIAGNOSIS — R45851 Suicidal ideations: Secondary | ICD-10-CM

## 2015-11-14 DIAGNOSIS — R45850 Homicidal ideations: Secondary | ICD-10-CM | POA: Diagnosis not present

## 2015-11-14 LAB — COMPREHENSIVE METABOLIC PANEL
ALT: 24 U/L (ref 17–63)
AST: 53 U/L — AB (ref 15–41)
Albumin: 3.9 g/dL (ref 3.5–5.0)
Alkaline Phosphatase: 80 U/L (ref 38–126)
Anion gap: 14 (ref 5–15)
BILIRUBIN TOTAL: 0.5 mg/dL (ref 0.3–1.2)
BUN: 8 mg/dL (ref 6–20)
CALCIUM: 8.9 mg/dL (ref 8.9–10.3)
CHLORIDE: 108 mmol/L (ref 101–111)
CO2: 22 mmol/L (ref 22–32)
CREATININE: 0.91 mg/dL (ref 0.61–1.24)
Glucose, Bld: 93 mg/dL (ref 65–99)
Potassium: 4 mmol/L (ref 3.5–5.1)
Sodium: 144 mmol/L (ref 135–145)
TOTAL PROTEIN: 7.1 g/dL (ref 6.5–8.1)

## 2015-11-14 LAB — CBC
HCT: 42.2 % (ref 39.0–52.0)
Hemoglobin: 14.7 g/dL (ref 13.0–17.0)
MCH: 34.9 pg — AB (ref 26.0–34.0)
MCHC: 34.8 g/dL (ref 30.0–36.0)
MCV: 100.2 fL — AB (ref 78.0–100.0)
PLATELETS: 119 10*3/uL — AB (ref 150–400)
RBC: 4.21 MIL/uL — AB (ref 4.22–5.81)
RDW: 13.2 % (ref 11.5–15.5)
WBC: 5.2 10*3/uL (ref 4.0–10.5)

## 2015-11-14 LAB — RAPID URINE DRUG SCREEN, HOSP PERFORMED
AMPHETAMINES: NOT DETECTED
Barbiturates: NOT DETECTED
Benzodiazepines: NOT DETECTED
Cocaine: NOT DETECTED
OPIATES: NOT DETECTED
Tetrahydrocannabinol: NOT DETECTED

## 2015-11-14 LAB — ACETAMINOPHEN LEVEL: Acetaminophen (Tylenol), Serum: <10 ug/mL — ABNORMAL LOW (ref 10–30)

## 2015-11-14 LAB — ETHANOL: ALCOHOL ETHYL (B): 251 mg/dL — AB (ref <5)

## 2015-11-14 LAB — SALICYLATE LEVEL

## 2015-11-14 MED ORDER — LORAZEPAM 1 MG PO TABS: 0.0000 mg | ORAL_TABLET | Freq: Two times a day (BID) | ORAL | Status: DC

## 2015-11-14 MED ORDER — LORAZEPAM 1 MG PO TABS
0.0000 mg | ORAL_TABLET | Freq: Four times a day (QID) | ORAL | Status: DC
  Administered 2015-11-14 – 2015-11-15 (×4): 1 mg via ORAL
  Filled 2015-11-14 (×4): qty 1

## 2015-11-14 MED ORDER — IBUPROFEN 400 MG PO TABS
400.0000 mg | ORAL_TABLET | Freq: Three times a day (TID) | ORAL | Status: DC | PRN
  Administered 2015-11-14 – 2015-11-15 (×2): 400 mg via ORAL
  Filled 2015-11-14 (×2): qty 1

## 2015-11-14 MED ORDER — IBUPROFEN 400 MG PO TABS
600.0000 mg | ORAL_TABLET | Freq: Once | ORAL | Status: AC
  Administered 2015-11-14: 600 mg via ORAL
  Filled 2015-11-14: qty 1

## 2015-11-14 MED ORDER — IBUPROFEN 400 MG PO TABS: 600.0000 mg | ORAL_TABLET | Freq: Three times a day (TID) | ORAL | Status: DC | PRN

## 2015-11-14 NOTE — ED Notes (Signed)
Notified staffing office for [facility] sitter and security for patient to be wanded. Patient in wine color scrubs.

## 2015-11-14 NOTE — ED Notes (Signed)
Interpretor assisted RN w/communicating w/pt. RN gave copy of [facility] policies and discussed w/pt - pt voiced understanding. States drinks 6-24oz beers/day. States has not ever experienced DT's. Pt noted to be calm, cooperative. Pt advised he is able to communicate w/staff via writing so interpretor may leave and will call him back if needed for more thorough communication.

## 2015-11-14 NOTE — ED Notes (Signed)
TTS continue at bedside.

## 2015-11-14 NOTE — ED Notes (Signed)
Lunch tray ordered for pt.

## 2015-11-14 NOTE — ED Notes (Addendum)
Pt ambulatory to C25 w/sitter, RN and deaf interpretor. Pt in maroon-colored paper scrubs and belongings x 1 labeled belongings bag placed at nurses' desk for inventory.

## 2015-11-14 NOTE — ED Notes (Signed)
Security wanded patient. Patient belongings placed in one bag at nurse station.

## 2015-11-14 NOTE — ED Notes (Signed)
TTS being completed at bedside with interpretor and patient.

## 2015-11-14 NOTE — ED Notes (Signed)
Called for interpreter 

## 2015-11-14 NOTE — ED Provider Notes (Signed)
CSN: [id number]     Arrival date & time 11/14/15  0902 History   First MD Initiated Contact with Patient 11/14/15 1031     Chief Complaint  Patient presents with  . Suicidal  . Foot Pain  . Diarrhea   (Consider location/radiation/quality/duration/timing/severity/associated sxs/prior Treatment) HPI 60 y.o. male hx of being deaf, presents to the Emergency Department today due to suicidal and homicidal ideation. States that he has been feeling depressed since 02/11/2013 after his son died from an overdose. Notes drinking 24oz cans of beer 4x per day. No liquor. No illicit drugs. Recently his daughter passed away on 15-Nov-2024and has made him feel worse. He does not have a plan for both his suicidal and homicidal ideation. Does not endorse seeing or hearing voices. Reports jumping a fence x5days ago after going to a beer store. Has pain on the outside of his foot. States it is a 7/10. No N/V/D. No CP/SOB/ABD pain. No headaches. No Dysuria. No other symptoms.    Interpreter was used for HPI  Past Medical History  Diagnosis Date  . Deaf    Past Surgical History  Procedure Laterality Date  . Neck surgery     No family history on file. Social History  Substance Use Topics  . Smoking status: Current Every Day Smoker  . Smokeless tobacco: None  . Alcohol Use: Yes    Review of Systems ROS reviewed and all are negative for acute change except as noted in the HPI.  Allergies  Review of patient's allergies indicates no known allergies.  Home Medications   Prior to Admission medications   Medication Sig Start Date End Date Taking? Authorizing Provider  ALPRAZolam (XANAX PO) Take by mouth.    Historical Provider, MD  ciprofloxacin (CIPRO) 500 MG tablet Take 1 tablet (500 mg total) by mouth 2 (two) times daily. 12/19/14   Junius Finner, PA-C  HYDROcodone-acetaminophen (NORCO/VICODIN) 5-325 MG per tablet Take 1 tablet by mouth every 6 (six) hours as needed for pain.    Historical Provider, MD   oxyCODONE-acetaminophen (PERCOCET) 5-325 MG per tablet Take 1-2 tablets by mouth every 6 (six) hours as needed for severe pain. 01/16/15   Pricilla Loveless, MD  traMADol (ULTRAM) 50 MG tablet Take 1 tablet (50 mg total) by mouth every 6 (six) hours as needed. 12/19/14   Junius Finner, PA-C   BP 128/70 mmHg  Pulse 64  Temp(Src) 97.8 F (36.6 C)  Resp 18  Ht  (1.753 m)  Wt 81.647 kg  BMI 26.57 kg/m2  SpO2 98%   Physical Exam  Constitutional: He is oriented to person, place, and time. He appears well-developed and well-nourished.  HENT:  Head: Normocephalic and atraumatic.  Eyes: EOM are normal.  Neck: Normal range of motion. Neck supple.  Cardiovascular: Normal rate, regular rhythm and normal heart sounds.   Pulmonary/Chest: Effort normal and breath sounds normal. No respiratory distress. He exhibits no tenderness.  Abdominal: Soft. There is no tenderness.  Musculoskeletal: Normal range of motion.       Left ankle: He exhibits normal range of motion, no swelling, no ecchymosis, no deformity and no laceration. Tenderness. Lateral malleolus tenderness found. No medial malleolus, no AITFL, no CF ligament, no posterior TFL, no head of 5th metatarsal and no proximal fibula tenderness found. Achilles tendon normal.  Neurological: He is alert and oriented to person, place, and time.  Skin: Skin is warm and dry.  Psychiatric: He has a normal mood and affect. His behavior  is normal. Thought content normal.  Nursing note and vitals reviewed.  ED Course  Procedures (including critical care time) Labs Review Labs Reviewed  COMPREHENSIVE METABOLIC PANEL - Abnormal; Notable for the following:    AST 53 (*)    All other components within normal limits  ETHANOL - Abnormal; Notable for the following:    Alcohol, Ethyl (B) 251 (*)    All other components within normal limits  ACETAMINOPHEN LEVEL - Abnormal; Notable for the following:    Acetaminophen (Tylenol), Serum <10 (*)    All other  components within normal limits  CBC - Abnormal; Notable for the following:    RBC 4.21 (*)    MCV 100.2 (*)    MCH 34.9 (*)    Platelets 119 (*)    All other components within normal limits  SALICYLATE LEVEL  URINE RAPID DRUG SCREEN, HOSP PERFORMED   Imaging Review No results found. I have personally reviewed and evaluated these images and lab results as part of my medical decision-making.   EKG Interpretation None     MDM  I have reviewed relevant laboratory values. I have reviewed the relevant previous healthcare records. I obtained HPI from historian. Patient discussed with supervising physician  ED Course:  Assessment: 60y M with hx deafness presents with SI and HI as well as left ankle pain. Notes pain occurred after jumping fence x5days ago. Able to ambulate. On exam, tenderness to lateral malleolus, but full ROM. Motor/sensastion intact. No obvious swelling or ecchymosis. Do not feel there is a fracture so low yield for Xray at this time. Most likely ankle sprain. Patient has SI with no plan. Has HI. Notes depression since 2014 due to son and daughter passing away. Has increase in ETOH intake. Pt in NAD. No withdrawal symptoms. Not tachycardia. VSS. Pt able to ambulate. Labs unremarkable. Consult with TTS. Will place in Psych hold until evaluated. Given ETOH 251, but in CIWA protocol. Patient is in no acute distress. Vital Signs are stable. Patient is able to ambulate. Patient able to tolerate PO.    Disposition/Plan:  TTS Admit  Pt acknowledges and agrees with plan   Supervising Physician Arby Barrette, MD  Final diagnoses:  Ankle pain, left  Suicidal ideation  ETOH abuse     Audry Pili, PA-C 11/14/15 1213  Arby Barrette, MD 11/15/15 [phone]

## 2015-11-14 NOTE — ED Notes (Signed)
Sign language interpretor arrived.

## 2015-11-14 NOTE — ED Notes (Signed)
Onset 5 days ago jumped over a fence twisted left foot wrote fractured left foot 2013. Pain currently 7/10 sore. States diarrhea for days and not eating.  Drinking unknown amount of beer morning to all night. Wrote suicidal denies homicidal ideation. Denies plan just thoughts of SI. Patient calm cooperative.

## 2015-11-14 NOTE — ED Notes (Signed)
Spoke with [employer] operational officer who stated interpreter will call back with an ETA of arriving to the ED. Patient able to communicate by writing.

## 2015-11-14 NOTE — Progress Notes (Signed)
Pt has been referred for inpatient psychiatric treatment. Also considered for admission to [facility], however currently at capacity.  Under review: [facility]- per [employer]- per [facility] (most likely for waiting list, unlikely there will be beds today) [facility]- per [facility], one low acuity bed [facility]- per [facility]- one bed open [facility]- per Britta Mccreedy- unknown bed status for the rest of the day- send referral in case of openings  Ilean Skill, MSW, LCSW Clinical Social Work, Disposition  11/14/2015 [phone]

## 2015-11-14 NOTE — ED Notes (Signed)
Pt's deaf interpretor sitting outside of pt's door. Recommended when we call for deaf interpretor after he leaves to request "Richard w/CAP" or interpretor w/mental health background.

## 2015-11-14 NOTE — BH Assessment (Addendum)
Tele Assessment Note   Lee [name] is an 60 y.o. male who presented voluntarily to [facility] due to severe depression w/ SI, as well as severe alcohol use. Pt is deaf and assessment was completed using an interpreter. Pt presented with anxious, depressed mood. He shared that he lost his son to OD in 02-22-2013 and his daughter died a month later. Pt reported that he has been drinking heavily since that time. Pt indicated that he has been very depressed and has never received any help for his depression. Pt shared that he has been having increasing SI and is worried that he might act on these thoughts. Pt continued that he receives SSDI and has been living from motel to motel due to losing his home in 2014-02-22 as a result of his grief and not being responsible in paying the bills. Pt reported that he has not been eating, that he can't eat and he couldn't identify when he last had a meal. Pt shared that all he does is drink alcohol constantly. Pt denies having any period of sobriety from 02-22-13 until now. Pt endorses depressive symptoms as tearfulness, isolating, moodiness, insomnia, and anhedonia. Pt is amenable to receiving medication mgmt to help palliate his depression. Pt also wants to stop drinking.   Diagnosis: Unspecified Depressive D/O; Alcohol Use D/O, severe  Past Medical History:  Past Medical History  Diagnosis Date  . Deaf     Past Surgical History  Procedure Laterality Date  . Neck surgery      Family History: No family history on file.  Social History:  reports that he has been smoking.  He does not have any smokeless tobacco history on file. He reports that he drinks alcohol. He reports that he does not use illicit drugs.  Additional Social History:  Alcohol / Drug Use Pain Medications: pt denies Prescriptions: pt denies Over the Counter: pt denies History of alcohol / drug use?: Yes Longest period of sobriety (when/how long): no period of sobriety Negative Consequences of Use: Personal  relationships, Legal, Financial Withdrawal Symptoms:  (unknown as pt never experienced withdrawal) Substance #1 Name of Substance 1: Alcohol 1 - Age of First Use: started abusing @ age 55 in 16 1 - Amount (size/oz): @ 6 24-ounce cans of Edge 1 - Frequency: daily 1 - Duration: ongoing 1 - Last Use / Amount: unknown  CIWA: CIWA-Ar BP: 128/70 mmHg Pulse Rate: 64 COWS:    PATIENT STRENGTHS: (choose at least two) Average or above average intelligence Capable of independent living Financial means Motivation for treatment/growth  Allergies: No Known Allergies  Home Medications:  (Not in a hospital admission)  OB/GYN Status:  No LMP for male patient.  General Assessment Data Location of Assessment: [facility] ED TTS Assessment: In system Is this a Tele or Face-to-Face Assessment?: Tele Assessment Is this an Initial Assessment or a Re-assessment for this encounter?: Initial Assessment Marital status: Divorced Is patient pregnant?: No Pregnancy Status: No Living Arrangements: Alone (going from "motel to motel") Can pt return to current living arrangement?: Yes Admission Status: Voluntary Is patient capable of signing voluntary admission?: Yes Referral Source: Self/Family/Friend Insurance type: [employer]  Medical Screening Exam [facility] Walk-in ONLY) Medical Exam completed: Yes  Crisis Care Plan Living Arrangements: Alone (going from "motel to motel") Name of Psychiatrist: none Name of Therapist: none  Education Status Is patient currently in school?: No  Risk to self with the past 6 months Suicidal Ideation: Yes-Currently Present Has patient been a risk to  self within the past 6 months prior to admission? : Yes Suicidal Intent: Yes-Currently Present Has patient had any suicidal intent within the past 6 months prior to admission? : No Is patient at risk for suicide?: Yes Suicidal Plan?: No Has patient had any suicidal plan within the past 6 months prior to  admission? : No Access to Means: No What has been your use of drugs/alcohol within the last 12 months?: see above Previous Attempts/Gestures: No Other Self Harm Risks: heavy drinking Intentional Self Injurious Behavior: None Family Suicide History: Unknown Recent stressful life event(s): Loss (Comment) (lost both children w/in a month of one another in 2014) Persecutory voices/beliefs?: No Depression: Yes Depression Symptoms: Insomnia, Tearfulness, Isolating, Loss of interest in usual pleasures, Feeling angry/irritable, Feeling worthless/self pity Substance abuse history and/or treatment for substance abuse?: Yes Suicide prevention information given to non-admitted patients: Not applicable  Risk to Others within the past 6 months Homicidal Ideation: No Does patient have any lifetime risk of violence toward others beyond the six months prior to admission? : No Thoughts of Harm to Others: No Current Homicidal Intent: No Current Homicidal Plan: No Access to Homicidal Means: No History of harm to others?: No Assessment of Violence: None Noted Violent Behavior Description: pt calm and cooperative at assessment Does patient have access to weapons?: No Criminal Charges Pending?: Yes Describe Pending Criminal Charges: simple assault & assault of a male (pt denies guilt) Does patient have a court date: Yes Court Date: 11/21/15 Is patient on probation?: No  Psychosis Hallucinations: None noted Delusions: None noted  Mental Status Report Appearance/Hygiene: Unremarkable Eye Contact: Fair Motor Activity: Gestures, Hyperactivity, Freedom of movement Speech: Other (Comment) (pt is deaf and spoke thru an interpreter via sign language) Level of Consciousness: Alert Mood: Depressed, Anxious, Despair, Helpless Affect: Appropriate to circumstance Anxiety Level: Moderate Thought Processes: Coherent, Relevant Judgement: Partial Orientation: Person, Place, Time, Situation Obsessive  Compulsive Thoughts/Behaviors: None  Cognitive Functioning Concentration: Normal Memory: Recent Intact, Remote Intact IQ: Average Insight: Fair Impulse Control: Fair Appetite: Poor Sleep: Decreased Vegetative Symptoms:  (refusing to eat)  ADLScreening [facility] Assessment Services) Patient's cognitive ability adequate to safely complete daily activities?: Yes  Prior Inpatient Therapy Prior Inpatient Therapy: No  Prior Outpatient Therapy Prior Outpatient Therapy: No Does patient have an ACCT team?: No Does patient have Intensive In-House Services?  : No Does patient have [facility] services? : No Does patient have P4CC services?: No  ADL Screening (condition at time of admission) Patient's cognitive ability adequate to safely complete daily activities?: Yes Is the patient deaf or have difficulty hearing?: Yes Does the patient have difficulty seeing, even when wearing glasses/contacts?: No Does the patient have difficulty concentrating, remembering, or making decisions?: No Does the patient have difficulty dressing or bathing?: No Does the patient have difficulty walking or climbing stairs?: No Weakness of Legs: None Weakness of Arms/Hands: None  Home Assistive Devices/Equipment Home Assistive Devices/Equipment: None  Therapy Consults (therapy consults require a physician order) PT Evaluation Needed: No OT Evalulation Needed: No SLP Evaluation Needed: No Abuse/Neglect Assessment (Assessment to be complete while patient is alone) Physical Abuse: Denies Verbal Abuse: Denies Sexual Abuse: Denies Exploitation of patient/patient's resources: Denies Self-Neglect: Denies Values / Beliefs Cultural Requests During Hospitalization: None Spiritual Requests During Hospitalization: None Consults Spiritual Care Consult Needed: No Social Work Consult Needed: No Merchant navy officer (For Healthcare) Does patient have an advance directive?: No Would patient like information on creating an  advanced directive?: No - patient declined information  Additional Information 1:1 In Past 12 Months?: No CIRT Risk: No Elopement Risk: No Does patient have medical clearance?: No     Disposition:  Disposition Initial Assessment Completed for this Encounter: Yes Disposition of Patient: Inpatient treatment program (Fransisca Kaufmanna Davis, NP) Type of inpatient treatment program: Adult (TTS to seek placement)  Laddie Aquas 11/14/2015 11:46 AM

## 2015-11-15 ENCOUNTER — Encounter (HOSPITAL_COMMUNITY): Payer: Self-pay

## 2015-11-15 ENCOUNTER — Inpatient Hospital Stay (HOSPITAL_COMMUNITY)
Admission: EM | Admit: 2015-11-15 | Discharge: 2015-12-05 | DRG: 885 | Disposition: A | Discharge status: Home / Self Care | Payer: PPO | Source: Intra-hospital | Attending: Psychiatry | Admitting: Psychiatry

## 2015-11-15 DIAGNOSIS — Z23 Encounter for immunization: Secondary | ICD-10-CM

## 2015-11-15 DIAGNOSIS — G4700 Insomnia, unspecified: Secondary | ICD-10-CM | POA: Diagnosis not present

## 2015-11-15 DIAGNOSIS — F17200 Nicotine dependence, unspecified, uncomplicated: Secondary | ICD-10-CM | POA: Diagnosis present

## 2015-11-15 DIAGNOSIS — M25572 Pain in left ankle and joints of left foot: Secondary | ICD-10-CM | POA: Diagnosis not present

## 2015-11-15 DIAGNOSIS — W19XXXA Unspecified fall, initial encounter: Secondary | ICD-10-CM | POA: Diagnosis present

## 2015-11-15 DIAGNOSIS — M79644 Pain in right finger(s): Secondary | ICD-10-CM | POA: Diagnosis not present

## 2015-11-15 DIAGNOSIS — M1990 Unspecified osteoarthritis, unspecified site: Secondary | ICD-10-CM | POA: Diagnosis present

## 2015-11-15 DIAGNOSIS — F332 Major depressive disorder, recurrent severe without psychotic features: Secondary | ICD-10-CM | POA: Diagnosis present

## 2015-11-15 DIAGNOSIS — F4323 Adjustment disorder with mixed anxiety and depressed mood: Secondary | ICD-10-CM | POA: Diagnosis not present

## 2015-11-15 DIAGNOSIS — T510X4D Toxic effect of ethanol, undetermined, subsequent encounter: Secondary | ICD-10-CM | POA: Diagnosis not present

## 2015-11-15 DIAGNOSIS — R569 Unspecified convulsions: Secondary | ICD-10-CM | POA: Diagnosis present

## 2015-11-15 DIAGNOSIS — F10230 Alcohol dependence with withdrawal, uncomplicated: Secondary | ICD-10-CM | POA: Diagnosis present

## 2015-11-15 DIAGNOSIS — W010XXD Fall on same level from slipping, tripping and stumbling without subsequent striking against object, subsequent encounter: Secondary | ICD-10-CM

## 2015-11-15 DIAGNOSIS — F411 Generalized anxiety disorder: Secondary | ICD-10-CM | POA: Diagnosis not present

## 2015-11-15 DIAGNOSIS — F1010 Alcohol abuse, uncomplicated: Secondary | ICD-10-CM | POA: Diagnosis not present

## 2015-11-15 DIAGNOSIS — M79672 Pain in left foot: Secondary | ICD-10-CM | POA: Diagnosis not present

## 2015-11-15 DIAGNOSIS — Y92239 Unspecified place in hospital as the place of occurrence of the external cause: Secondary | ICD-10-CM

## 2015-11-15 DIAGNOSIS — F1020 Alcohol dependence, uncomplicated: Secondary | ICD-10-CM | POA: Insufficient documentation

## 2015-11-15 DIAGNOSIS — Y900 Blood alcohol level of less than 20 mg/100 ml: Secondary | ICD-10-CM | POA: Diagnosis not present

## 2015-11-15 DIAGNOSIS — H9190 Unspecified hearing loss, unspecified ear: Secondary | ICD-10-CM | POA: Diagnosis not present

## 2015-11-15 DIAGNOSIS — S93402A Sprain of unspecified ligament of left ankle, initial encounter: Secondary | ICD-10-CM | POA: Diagnosis not present

## 2015-11-15 DIAGNOSIS — M25579 Pain in unspecified ankle and joints of unspecified foot: Secondary | ICD-10-CM | POA: Insufficient documentation

## 2015-11-15 DIAGNOSIS — M542 Cervicalgia: Secondary | ICD-10-CM | POA: Diagnosis not present

## 2015-11-15 DIAGNOSIS — F339 Major depressive disorder, recurrent, unspecified: Secondary | ICD-10-CM | POA: Diagnosis not present

## 2015-11-15 DIAGNOSIS — M7989 Other specified soft tissue disorders: Secondary | ICD-10-CM | POA: Diagnosis not present

## 2015-11-15 DIAGNOSIS — M25571 Pain in right ankle and joints of right foot: Secondary | ICD-10-CM | POA: Diagnosis not present

## 2015-11-15 DIAGNOSIS — F410 Panic disorder [episodic paroxysmal anxiety] without agoraphobia: Secondary | ICD-10-CM | POA: Diagnosis not present

## 2015-11-15 DIAGNOSIS — Z79899 Other long term (current) drug therapy: Secondary | ICD-10-CM

## 2015-11-15 DIAGNOSIS — S99912A Unspecified injury of left ankle, initial encounter: Secondary | ICD-10-CM | POA: Diagnosis not present

## 2015-11-15 DIAGNOSIS — T65294D Toxic effect of other tobacco and nicotine, undetermined, subsequent encounter: Secondary | ICD-10-CM | POA: Diagnosis not present

## 2015-11-15 MED ORDER — LORAZEPAM 1 MG PO TABS
1.0000 mg | ORAL_TABLET | Freq: Four times a day (QID) | ORAL | Status: AC
  Administered 2015-11-15 (×3): 1 mg via ORAL
  Filled 2015-11-15 (×3): qty 1

## 2015-11-15 MED ORDER — LOPERAMIDE HCL 2 MG PO CAPS
2.0000 mg | ORAL_CAPSULE | ORAL | Status: DC | PRN
  Administered 2015-11-15: 2 mg via ORAL
  Filled 2015-11-15: qty 1

## 2015-11-15 MED ORDER — ADULT MULTIVITAMIN W/MINERALS CH
1.0000 | ORAL_TABLET | Freq: Every day | ORAL | Status: DC
  Administered 2015-11-15 – 2015-12-05 (×21): 1 via ORAL
  Filled 2015-11-15 (×24): qty 1

## 2015-11-15 MED ORDER — VITAMIN B-1 100 MG PO TABS
100.0000 mg | ORAL_TABLET | Freq: Every day | ORAL | Status: DC
  Administered 2015-11-16 – 2015-12-05 (×20): 100 mg via ORAL
  Filled 2015-11-15 (×23): qty 1

## 2015-11-15 MED ORDER — LORAZEPAM 1 MG PO TABS
1.0000 mg | ORAL_TABLET | Freq: Three times a day (TID) | ORAL | Status: AC
  Administered 2015-11-16 (×2): 1 mg via ORAL
  Filled 2015-11-15 (×2): qty 1

## 2015-11-15 MED ORDER — TRAZODONE HCL 50 MG PO TABS
50.0000 mg | ORAL_TABLET | Freq: Every evening | ORAL | Status: DC | PRN
  Administered 2015-11-15 – 2015-11-20 (×6): 50 mg via ORAL
  Filled 2015-11-15 (×6): qty 1

## 2015-11-15 MED ORDER — LORAZEPAM 1 MG PO TABS
1.0000 mg | ORAL_TABLET | Freq: Two times a day (BID) | ORAL | Status: AC
  Administered 2015-11-17 (×2): 1 mg via ORAL
  Filled 2015-11-15 (×2): qty 1

## 2015-11-15 MED ORDER — MAGNESIUM HYDROXIDE 400 MG/5ML PO SUSP: 30.0000 mL | Freq: Every day | ORAL | Status: DC | PRN

## 2015-11-15 MED ORDER — ENSURE ENLIVE PO LIQD
237.0000 mL | Freq: Two times a day (BID) | ORAL | Status: DC
  Administered 2015-11-16: 237 mL via ORAL

## 2015-11-15 MED ORDER — INFLUENZA VAC SPLIT QUAD 0.5 ML IM SUSY
0.5000 mL | PREFILLED_SYRINGE | INTRAMUSCULAR | Status: AC
  Administered 2015-11-16: 0.5 mL via INTRAMUSCULAR
  Filled 2015-11-15: qty 0.5

## 2015-11-15 MED ORDER — LORAZEPAM 1 MG PO TABS: 1.0000 mg | ORAL_TABLET | Freq: Four times a day (QID) | ORAL | Status: AC | PRN

## 2015-11-15 MED ORDER — LORAZEPAM 1 MG PO TABS
1.0000 mg | ORAL_TABLET | Freq: Every day | ORAL | Status: AC
  Administered 2015-11-18: 1 mg via ORAL
  Filled 2015-11-15: qty 1

## 2015-11-15 MED ORDER — IBUPROFEN 600 MG PO TABS
600.0000 mg | ORAL_TABLET | ORAL | Status: DC | PRN
  Administered 2015-11-15 – 2015-12-02 (×30): 600 mg via ORAL
  Filled 2015-11-15 (×31): qty 1

## 2015-11-15 MED ORDER — ALUM & MAG HYDROXIDE-SIMETH 200-200-20 MG/5ML PO SUSP: 30.0000 mL | ORAL | Status: DC | PRN

## 2015-11-15 MED ORDER — HYDROXYZINE HCL 25 MG PO TABS
25.0000 mg | ORAL_TABLET | Freq: Four times a day (QID) | ORAL | Status: AC | PRN
  Filled 2015-11-15: qty 1

## 2015-11-15 MED ORDER — ONDANSETRON 4 MG PO TBDP: 4.0000 mg | ORAL_TABLET | Freq: Four times a day (QID) | ORAL | Status: AC | PRN

## 2015-11-15 MED ORDER — NICOTINE 21 MG/24HR TD PT24
21.0000 mg | MEDICATED_PATCH | Freq: Every day | TRANSDERMAL | Status: DC
  Administered 2015-11-15 – 2015-12-05 (×21): 21 mg via TRANSDERMAL
  Filled 2015-11-15 (×23): qty 1

## 2015-11-15 MED ORDER — ACETAMINOPHEN 325 MG PO TABS
650.0000 mg | ORAL_TABLET | Freq: Four times a day (QID) | ORAL | Status: DC | PRN
  Administered 2015-11-16 – 2015-12-03 (×6): 650 mg via ORAL
  Filled 2015-11-15 (×7): qty 2

## 2015-11-15 MED ORDER — PNEUMOCOCCAL VAC POLYVALENT 25 MCG/0.5ML IJ INJ
0.5000 mL | INJECTION | INTRAMUSCULAR | Status: AC
  Administered 2015-11-16: 0.5 mL via INTRAMUSCULAR

## 2015-11-15 MED ORDER — LORAZEPAM 1 MG PO TABS
ORAL_TABLET | ORAL | Status: AC
  Filled 2015-11-15: qty 1

## 2015-11-15 MED ORDER — LOPERAMIDE HCL 2 MG PO CAPS: 2.0000 mg | ORAL_CAPSULE | ORAL | Status: DC | PRN

## 2015-11-15 MED ORDER — THIAMINE HCL 100 MG/ML IJ SOLN
100.0000 mg | Freq: Once | INTRAMUSCULAR | Status: AC
  Administered 2015-11-15: 100 mg via INTRAMUSCULAR
  Filled 2015-11-15: qty 2

## 2015-11-15 NOTE — ED Notes (Signed)
Lee [name] with [facility] called. Pt is going to [facility] tomorrow, room 3072. She requested that we get a CIWA with AM vitals. Stated the [facility] will arrange for an interpreter and set up transfer time to coordinate with interpreter availability.

## 2015-11-15 NOTE — [facility] Group Notes (Signed)
[facility] LCSW Group Therapy 11/15/2015  1:15 PM   Type of Therapy: Group Therapy  Participation Level: Did Not Attend. Patient invited to participate but declined.   [name], MSW, LCSW Clinical Social Worker [facility] [phone]

## 2015-11-15 NOTE — ED Notes (Signed)
Pt ambulated to the restroom in a steady manner 

## 2015-11-15 NOTE — Progress Notes (Signed)
Pt accepted to [facility] bed 307-2 by Dr Dub Mikes. Pt can arrive 11:20 as sign language interpreter will be available. Report can be called at [phone].  Ilean Skill, MSW, LCSW Clinical Social Work, Disposition  11/15/2015 [phone]

## 2015-11-15 NOTE — ED Notes (Signed)
Phone number for deaf interpreter is [phone]

## 2015-11-15 NOTE — ED Notes (Signed)
Patient was given a snack and drink, A regular diet ordered for lunch. 

## 2015-11-15 NOTE — Progress Notes (Signed)
Patient ID: Lee [name], male   DOB: [birth year], 60 y.o.   MRN: [id number]   60 year old pt presents to [facility] from [facility] ED. Pt is deaf, use of interpreter required. Interpreter present throughout initial assessment and admission. Pt reports that he has been drinking every day since "[address]59 when my 67 year old daughter and 58 year old son died." Pt reports to MD that both of his children died of OD. Pt reports to MD that he had previously been in rehab in 1999 for alcohol abuse. Pt reports to Clinical research associate he was drinking at least three "tall boys" of beer a day and  "I really need to stop." Pt reports three pending court dates, all for threatening other while being drunk. Pt is currently homeless and has not been able to afford his medications. Pt currently denies SI/HI and A/V hallucinations. Through the use of an interpreter, pt agrees to seek staff if SI/HI or A/VH occurs and to consult with staff before acting on these thoughts.  Consents signed, skin/belongings search completed and pt oriented to unit. Pt stable at this time. Pt given the opportunity to express concerns and ask questions. Pt given toiletries. Interpreter still with pt. Will continue to monitor.

## 2015-11-15 NOTE — [facility] Suicide Risk Assessment (Signed)
[facility] Admission Suicide Risk Assessment   Nursing information obtained from:    Demographic factors:    Current Mental Status:    Loss Factors:    Historical Factors:    Risk Reduction Factors:     Total Time spent with patient: 45 minutes Principal Problem: Severe recurrent major depression without psychotic features (HCC) Diagnosis:   Patient Active Problem List   Diagnosis Date Noted  . Alcohol dependence (HCC) [F10.20] 11/15/2015  . Severe recurrent major depression without psychotic features (HCC) [F33.2] 11/15/2015   Subjective Data: see admission H and P  Continued Clinical Symptoms:  Alcohol Use Disorder Identification Test Final Score (AUDIT): 30 The "Alcohol Use Disorders Identification Test", Guidelines for Use in Primary Care, Second Edition.  World Science writer [facility]). Score between 0-7:  no or low risk or alcohol related problems. Score between 8-15:  moderate risk of alcohol related problems. Score between 16-19:  high risk of alcohol related problems. Score 20 or above:  warrants further diagnostic evaluation for alcohol dependence and treatment.   CLINICAL FACTORS:   Depression:   Comorbid alcohol abuse/dependence Alcohol/Substance Abuse/Dependencies  Psychiatric Specialty Exam: ROS  Blood pressure 122/84, pulse 101, temperature 98.5 F (36.9 C), temperature source Oral, resp. rate 16, height  (1.753 m), weight 81.647 kg (180 lb).Body mass index is 26.57 kg/(m^2).   COGNITIVE FEATURES THAT CONTRIBUTE TO RISK:  Closed-mindedness, Polarized thinking and Thought constriction (tunnel vision)    SUICIDE RISK:   Moderate:  Frequent suicidal ideation with limited intensity, and duration, some specificity in terms of plans, no associated intent, good self-control, limited dysphoria/symptomatology, some risk factors present, and identifiable protective factors, including available and accessible social support.  PLAN OF CARE: see admission H and P  I  certify that inpatient services furnished can reasonably be expected to improve the patient's condition.   Rachael Fee, MD 11/15/2015, 4:29 PM

## 2015-11-15 NOTE — Progress Notes (Signed)
Patient ID: Lee [name], male   DOB: November 25, 1955, 60 y.o.   MRN: [id number] PER STATE REGULATIONS 482.30  THIS CHART WAS REVIEWED FOR MEDICAL NECESSITY WITH RESPECT TO THE PATIENT'S ADMISSION/DURATION OF STAY.  NEXT REVIEW DATE: 11/19/15  Loura Halt, RN, BSN CASE MANAGER

## 2015-11-15 NOTE — ED Notes (Signed)
Patient was given a ice pack. 

## 2015-11-15 NOTE — Tx Team (Signed)
Initial Interdisciplinary Treatment Plan   PATIENT STRESSORS: Financial difficulties Loss of duaghter and son; both OD Medication change or noncompliance Substance abuse   PATIENT STRENGTHS: Active sense of humor Average or above average intelligence General fund of knowledge Motivation for treatment/growth Physical Health   PROBLEM LIST: Problem List/Patient Goals Date to be addressed Date deferred Reason deferred Estimated date of resolution  "I need to stop drinking" 2015-11-30      "I've been drinking since my son and daughter died" 11/30/15     homeless 11/30/15     3 court dates coming up 11/30/2015                                    DISCHARGE CRITERIA:  Improved stabilization in mood, thinking, and/or behavior Motivation to continue treatment in a less acute level of care Safe-care adequate arrangements made Verbal commitment to aftercare and medication compliance Withdrawal symptoms are absent or subacute and managed without 24-hour nursing intervention  PRELIMINARY DISCHARGE PLAN: Attend aftercare/continuing care group Attend 12-step recovery group Placement in alternative living arrangements  PATIENT/FAMIILY INVOLVEMENT: This treatment plan has been presented to and reviewed with the patient, [name]. The patient and family have been given the opportunity to ask questions and make suggestions.  Aurora Mask 11/30/2015, 3:39 PM

## 2015-11-15 NOTE — H&P (Signed)
Psychiatric Admission Assessment Adult  Patient Identification: Lee [name] MRN:  [id number] Date of Evaluation:  11/15/2015 Chief Complaint:  Depressive Disorder Principal Diagnosis: <principal problem not specified> Diagnosis:  There are no active problems to display for this patient.  History of Present Illness:: 60 Y/O deaf gentleman who admits he is drinking too much. Drinks  every day, has not been eating. States his drinking got worst in 2014 after both his son and his daughter died a month apart  Sep 2014 Oct 2014 both OD on opioids. Drinking "a lot" 40's 3-6 in a day. Every day since 2014. Will go to court for simple assault states he was trying to separate two people who were drinking. Worried about the court date. Associated Signs/Symptoms: Depression Symptoms:  depressed mood, anhedonia, insomnia, fatigue, feelings of worthlessness/guilt, hopelessness, anxiety, panic attacks, loss of energy/fatigue, disturbed sleep, weight loss, decreased appetite, (Hypo) Manic Symptoms:  Irritable Mood, Labiality of Mood, Anxiety Symptoms:  Excessive Worry, Panic Symptoms, Psychotic Symptoms:  denies PTSD Symptoms: Had a traumatic exposure:  death of a son and a daughter to opioid OD Total Time spent with patient: 45 minutes  Past Psychiatric History:   Is the patient at risk to self? Yes.    Has the patient been a risk to self in the past 6 months? Yes.    Has the patient been a risk to self within the distant past? No.  Is the patient a risk to others? No.  Has the patient been a risk to others in the past 6 months? No.  Has the patient been a risk to others within the distant past? No.   Prior Inpatient Therapy:  Denies [place] Icard 28 days 1999 Prior Outpatient Therapy:  Denies seeing a Social worker. Had Xanax prescribed by a PCP  Alcohol Screening:   Substance Abuse History in the last 12 months:  Yes.   Consequences of Substance Abuse: Withdrawal Symptoms:    Diaphoresis Diarrhea Nausea Tremors Vomiting Previous Psychotropic Medications: Xanax Psychological Evaluations: No  Past Medical History:  Past Medical History  Diagnosis Date  . Deaf     Past Surgical History  Procedure Laterality Date  . Neck surgery     Family History: No family history on file. Family Psychiatric  History: Addiction in two of his kids who died of opioid OD  Tobacco Screening: @FLOW ([phone])::1)@ Social History:  History  Alcohol Use  . Yes     History  Drug Use No   Lives in [place], his GF (17 years relationship is staying  in the shelter) 17 years , has one living daughter 67 in [place], and a 30 son who lives with frinds. 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 Now  gets disability  Additional Social History:                           Allergies:  No Known Allergies Lab Results:  Results for orders placed or performed during the hospital encounter of 11/14/15 (from the past 48 hour(s))  Comprehensive metabolic panel     Status: Abnormal   Collection Time: 11/14/15 10:19 AM  Result Value Ref Range   Sodium 144 135 - 145 mmol/L   Potassium 4.0 3.5 - 5.1 mmol/L   Chloride 108 101 - 111 mmol/L   CO2 22 22 - 32 mmol/L   Glucose, Bld 93 65 - 99 mg/dL   BUN 8 6 - 20 mg/dL   Creatinine, Ser 0.91 0.61 -  1.24 mg/dL   Calcium 8.9 8.9 - 10.3 mg/dL   Total Protein 7.1 6.5 - 8.1 g/dL   Albumin 3.9 3.5 - 5.0 g/dL   AST 53 (H) 15 - 41 U/L   ALT 24 17 - 63 U/L   Alkaline Phosphatase 80 38 - 126 U/L   Total Bilirubin 0.5 0.3 - 1.2 mg/dL   GFR calc non Af Amer >60 >60 mL/min   GFR calc Af Amer >60 >60 mL/min    Comment: (NOTE) The eGFR has been calculated using the CKD EPI equation. This calculation has not been validated in all clinical situations. eGFR's persistently <60 mL/min signify possible Chronic Kidney Disease.    Anion gap 14 5 - 15  Ethanol (ETOH)     Status: Abnormal   Collection Time: 11/14/15 10:19 AM  Result Value Ref Range    Alcohol, Ethyl (B) 251 (H) <5 mg/dL    Comment:        LOWEST DETECTABLE LIMIT FOR SERUM ALCOHOL IS 5 mg/dL FOR MEDICAL PURPOSES ONLY   Salicylate level     Status: None   Collection Time: 11/14/15 10:19 AM  Result Value Ref Range   Salicylate Lvl <2.2 2.8 - 30.0 mg/dL  Acetaminophen level     Status: Abnormal   Collection Time: 11/14/15 10:19 AM  Result Value Ref Range   Acetaminophen (Tylenol), Serum <10 (L) 10 - 30 ug/mL    Comment:        THERAPEUTIC CONCENTRATIONS VARY SIGNIFICANTLY. A RANGE OF 10-30 ug/mL MAY BE AN EFFECTIVE CONCENTRATION FOR MANY PATIENTS. HOWEVER, SOME ARE BEST TREATED AT CONCENTRATIONS OUTSIDE THIS RANGE. ACETAMINOPHEN CONCENTRATIONS >150 ug/mL AT 4 HOURS AFTER INGESTION AND >50 ug/mL AT 12 HOURS AFTER INGESTION ARE OFTEN ASSOCIATED WITH TOXIC REACTIONS.   CBC     Status: Abnormal   Collection Time: 11/14/15 10:19 AM  Result Value Ref Range   WBC 5.2 4.0 - 10.5 K/uL   RBC 4.21 (L) 4.22 - 5.81 MIL/uL   Hemoglobin 14.7 13.0 - 17.0 g/dL   HCT 42.2 39.0 - 52.0 %   MCV 100.2 (H) 78.0 - 100.0 fL   MCH 34.9 (H) 26.0 - 34.0 pg   MCHC 34.8 30.0 - 36.0 g/dL   RDW 13.2 11.5 - 15.5 %   Platelets 119 (L) 150 - 400 K/uL    Comment: PLATELET COUNT CONFIRMED BY SMEAR REPEATED TO VERIFY   Urine rapid drug screen (hosp performed) (Not at [facility])     Status: None   Collection Time: 11/14/15 12:03 PM  Result Value Ref Range   Opiates NONE DETECTED NONE DETECTED   Cocaine NONE DETECTED NONE DETECTED   Benzodiazepines NONE DETECTED NONE DETECTED   Amphetamines NONE DETECTED NONE DETECTED   Tetrahydrocannabinol NONE DETECTED NONE DETECTED   Barbiturates NONE DETECTED NONE DETECTED    Comment:        DRUG SCREEN FOR MEDICAL PURPOSES ONLY.  IF CONFIRMATION IS NEEDED FOR ANY PURPOSE, NOTIFY LAB WITHIN 5 DAYS.        LOWEST DETECTABLE LIMITS FOR URINE DRUG SCREEN Drug Class       Cutoff (ng/mL) Amphetamine      1000 Barbiturate      200 Benzodiazepine    025 Tricyclics       427 Opiates          300 Cocaine          300 THC  50     Metabolic Disorder Labs:  No results found for: HGBA1C, MPG No results found for: PROLACTIN No results found for: CHOL, TRIG, HDL, CHOLHDL, VLDL, LDLCALC  Current Medications: No current facility-administered medications for this encounter.   PTA Medications: Prescriptions prior to admission  Medication Sig Dispense Refill Last Dose  . ALPRAZolam (XANAX PO) Take by mouth.     . ciprofloxacin (CIPRO) 500 MG tablet Take 1 tablet (500 mg total) by mouth 2 (two) times daily. 10 tablet 0   . oxyCODONE-acetaminophen (PERCOCET) 5-325 MG per tablet Take 1-2 tablets by mouth every 6 (six) hours as needed for severe pain. 20 tablet 0   . traMADol (ULTRAM) 50 MG tablet Take 1 tablet (50 mg total) by mouth every 6 (six) hours as needed. 15 tablet 0     Musculoskeletal: Strength & Muscle Tone: within normal limits Gait & Station: normal Patient leans: normal  Psychiatric Specialty Exam: Physical Exam  Review of Systems  Constitutional: Positive for weight loss and malaise/fatigue.  Eyes: Negative.   Respiratory: Positive for cough and shortness of breath.        Pack a day  Cardiovascular: Negative.   Gastrointestinal: Positive for nausea.  Genitourinary: Negative.   Musculoskeletal: Positive for back pain, joint pain and neck pain.  Skin: Negative.   Neurological: Positive for dizziness, weakness and headaches.  Endo/Heme/Allergies: Negative.   Psychiatric/Behavioral: Positive for depression and substance abuse. The patient is nervous/anxious and has insomnia.     Blood pressure 122/84, pulse 101, temperature 98.5 F (36.9 C), temperature source Oral, resp. rate 16, height 5' 9"  (1.753 m), weight 81.647 kg (180 lb).Body mass index is 26.57 kg/(m^2).  General Appearance: Disheveled and body odor  Eye Contact::  Fair  Speech:  deaf  Volume:  deaf  Mood:  Anxious, Depressed and Dysphoric   Affect:  Depressed, Labile and anxious worried  Thought Process:  Coherent and Goal Directed  Orientation:  Other:  place person  Thought Content:  Symptoms events worries concerns  Suicidal Thoughts:  Denies  Homicidal Thoughts:  No  Memory:  Immediate;   Fair Recent;   Fair Remote;   Fair  Judgement:  Fair  Insight:  Shallow  Psychomotor Activity:  Restlessness  Concentration:  Fair  Recall:  [employer] of Knowledge:Fair  Language: Fair  Akathisia:  No  Handed:  Right  AIMS (if indicated):     Assets:  Desire for Improvement  ADL's:  Intact  Cognition: WNL  Sleep:        Treatment Plan Summary: Daily contact with patient to assess and evaluate symptoms and progress in treatment and Medication management Supportive approach/coping skills Alcohol dependence; Ativan detox protocol/work a relapse prevention plan Depression; reassess for the need for an antidepressant Work on grief and loss Explore residential treatment options Observation Level/Precautions:  15 minute checks  Laboratory:  As per the ED  Psychotherapy:  Individual/group  Medications:  Ativan detox protocol  Consultations:    Discharge Concerns:  Need for a residential treatment program  Estimated LOS: 3-5 days  Other:     I certify that inpatient services furnished can reasonably be expected to improve the patient's condition.    Nicholaus Bloom, MD 2/15/20171:12 PM

## 2015-11-15 NOTE — Progress Notes (Signed)
Pt attended evening NA group. Interpreter at side.

## 2015-11-15 NOTE — ED Notes (Signed)
[facility] called for transport after 11:20 am

## 2015-11-16 ENCOUNTER — Ambulatory Visit (HOSPITAL_COMMUNITY): Admission: EM | Admit: 2015-11-16 | Payer: PPO | Source: Intra-hospital | Admitting: Psychiatry

## 2015-11-16 ENCOUNTER — Ambulatory Visit (HOSPITAL_COMMUNITY)
Admit: 2015-11-16 | Discharge: 2015-11-16 | Disposition: A | Discharge status: Home / Self Care | Payer: PPO | Attending: Psychiatry | Admitting: Psychiatry

## 2015-11-16 DIAGNOSIS — M79644 Pain in right finger(s): Secondary | ICD-10-CM | POA: Insufficient documentation

## 2015-11-16 DIAGNOSIS — F10230 Alcohol dependence with withdrawal, uncomplicated: Secondary | ICD-10-CM | POA: Diagnosis not present

## 2015-11-16 DIAGNOSIS — M25571 Pain in right ankle and joints of right foot: Secondary | ICD-10-CM | POA: Insufficient documentation

## 2015-11-16 DIAGNOSIS — F411 Generalized anxiety disorder: Secondary | ICD-10-CM | POA: Diagnosis not present

## 2015-11-16 DIAGNOSIS — M79672 Pain in left foot: Secondary | ICD-10-CM | POA: Insufficient documentation

## 2015-11-16 DIAGNOSIS — G4700 Insomnia, unspecified: Secondary | ICD-10-CM | POA: Diagnosis not present

## 2015-11-16 DIAGNOSIS — R569 Unspecified convulsions: Secondary | ICD-10-CM | POA: Diagnosis not present

## 2015-11-16 DIAGNOSIS — M1990 Unspecified osteoarthritis, unspecified site: Secondary | ICD-10-CM | POA: Diagnosis not present

## 2015-11-16 DIAGNOSIS — Z23 Encounter for immunization: Secondary | ICD-10-CM | POA: Diagnosis not present

## 2015-11-16 DIAGNOSIS — Z79899 Other long term (current) drug therapy: Secondary | ICD-10-CM | POA: Diagnosis not present

## 2015-11-16 DIAGNOSIS — M25572 Pain in left ankle and joints of left foot: Secondary | ICD-10-CM | POA: Diagnosis not present

## 2015-11-16 DIAGNOSIS — F17200 Nicotine dependence, unspecified, uncomplicated: Secondary | ICD-10-CM | POA: Diagnosis not present

## 2015-11-16 DIAGNOSIS — F332 Major depressive disorder, recurrent severe without psychotic features: Secondary | ICD-10-CM | POA: Diagnosis not present

## 2015-11-16 DIAGNOSIS — H9190 Unspecified hearing loss, unspecified ear: Secondary | ICD-10-CM | POA: Diagnosis not present

## 2015-11-16 DIAGNOSIS — F4323 Adjustment disorder with mixed anxiety and depressed mood: Secondary | ICD-10-CM | POA: Diagnosis not present

## 2015-11-16 DIAGNOSIS — M7989 Other specified soft tissue disorders: Secondary | ICD-10-CM | POA: Diagnosis not present

## 2015-11-16 DIAGNOSIS — S99912A Unspecified injury of left ankle, initial encounter: Secondary | ICD-10-CM | POA: Diagnosis not present

## 2015-11-16 DIAGNOSIS — W19XXXA Unspecified fall, initial encounter: Secondary | ICD-10-CM | POA: Diagnosis not present

## 2015-11-16 DIAGNOSIS — F410 Panic disorder [episodic paroxysmal anxiety] without agoraphobia: Secondary | ICD-10-CM | POA: Diagnosis not present

## 2015-11-16 DIAGNOSIS — S93402A Sprain of unspecified ligament of left ankle, initial encounter: Secondary | ICD-10-CM | POA: Diagnosis not present

## 2015-11-16 MED ORDER — ENSURE ENLIVE PO LIQD
237.0000 mL | Freq: Three times a day (TID) | ORAL | Status: DC
  Administered 2015-11-16 – 2015-12-04 (×49): 237 mL via ORAL

## 2015-11-16 NOTE — Progress Notes (Signed)
[facility] Group Notes:  (Nursing/MHT/Case Management/Adjunct)  Date:  11/16/2015  Time:  2045 Type of Therapy:  wrap up group  Participation Level:  Active  Participation Quality:  Appropriate, Attentive, Sharing and Supportive  Affect:  Flat  Cognitive:  Appropriate  Insight:  Improving  Engagement in Group:  Engaged  Modes of Intervention:  Clarification, Education and Support  Summary of Progress/Problems: Pt reports dealing with depression since loosing his son to heroin in 2014 and then his daughter to the same drug two years later. Pt reported turning to alcohol and now ready to get sober and would like to get a job.   Shelah Lewandowsky 11/16/2015, 10:03 PM

## 2015-11-16 NOTE — Progress Notes (Signed)
Pt has been in the dayroom most of the evening.  With the interpreter, he attended AA group this evening.  He also denies SI/HI/AVH and is having minimal withdrawal symptoms.  Writer explained what meds he would be given tonight and what they were for.  He also voiced having pain in his L foot for which he was given Ibuprofen.  Pt was given his meds before the interpreter left.  Support and encouragement offered.  Safety maintained with q15 minute checks.

## 2015-11-16 NOTE — Progress Notes (Signed)
1:1 observation: Patient placed on 1:1 due unsteadiness on feet and fall.  Patient fell around 1200 am and injured his left foot and ankle.  His right index finger is also swollen.  He is scheduled to go to xray around 1300.  Patient presents with depressed, sad affect.  He denies SI/HI/AVH.  He reports withdrawal symptoms such as tremors, cravings, nausea and runny nose.  Patient may ambulate with wheelchair per nursing care order.  His main complaint is foot pain which he received ibuprofen for.  An interpreter is assisting him with sign language.  Continue to monitor 1:1 for high fall risk.

## 2015-11-16 NOTE — Progress Notes (Signed)
[facility] MD Progress Note  11/16/2015 5:04 PM [name]  MRN:  [id number] Subjective:  Lee [name] fell and twisted his ankle and a finger. Has pain and swelling. He is trying to get himself together. Admits to nausea diarrhea "shakes" sill endorses feeling down depressed Principal Problem: Severe recurrent major depression without psychotic features (HCC) Diagnosis:   Patient Active Problem List   Diagnosis Date Noted  . Alcohol dependence (HCC) [F10.20] 11/15/2015  . Severe recurrent major depression without psychotic features (HCC) [F33.2] 11/15/2015   Total Time spent with patient: 20 minutes  Past Psychiatric History: see admission H and P  Past Medical History:  Past Medical History  Diagnosis Date  . Deaf     Past Surgical History  Procedure Laterality Date  . Neck surgery     Family History: History reviewed. No pertinent family history. Family Psychiatric  History: See admission H and P Social History:  History  Alcohol Use  . 3.6 oz/week  . 6 Cans of beer per week     History  Drug Use No    Social History   Social History  . Marital Status: Divorced    Spouse Name: N/A  . Number of Children: N/A  . Years of Education: N/A   Social History Main Topics  . Smoking status: Current Every Day Smoker  . Smokeless tobacco: None  . Alcohol Use: 3.6 oz/week    6 Cans of beer per week  . Drug Use: No  . Sexual Activity: Not Asked   Other Topics Concern  . None   Social History Narrative   Additional Social History:    Pain Medications: Given Ibuprofen at ED Prescriptions: see PTA meds Over the Counter: denies History of alcohol / drug use?: Yes Longest period of sobriety (when/how long): pt reports being in rehab in 1999 Negative Consequences of Use: Legal, Personal relationships, Financial Withdrawal Symptoms: Agitation, Seizures, Tremors, Diarrhea Name of Substance 1: Alcohol 1 - Age of First Use: conflicting reports "1999" to one staff, reports to  Clinical research associate "2014" 1 - Amount (size/oz): 3 "tall boys" a day 1 - Frequency: daily 1 - Duration: ongoing 1 - Last Use / Amount: 11/13/15                  Sleep: Fair  Appetite:  Poor  Current Medications: Current Facility-Administered Medications  Medication Dose Route Frequency Provider Last Rate Last Dose  . acetaminophen (TYLENOL) tablet 650 mg  650 mg Oral Q6H PRN Rachael Fee, MD      . alum & mag hydroxide-simeth (MAALOX/MYLANTA) 200-200-20 MG/5ML suspension 30 mL  30 mL Oral Q4H PRN Rachael Fee, MD      . feeding supplement (ENSURE ENLIVE) (ENSURE ENLIVE) liquid 237 mL  237 mL Oral TID BM Rachael Fee, MD   237 mL at 11/16/15 1400  . hydrOXYzine (ATARAX/VISTARIL) tablet 25 mg  25 mg Oral Q6H PRN Rachael Fee, MD      . ibuprofen (ADVIL,MOTRIN) tablet 600 mg  600 mg Oral Q4H PRN Rachael Fee, MD   600 mg at 11/16/15 1457  . loperamide (IMODIUM) capsule 2 mg  2 mg Oral PRN Rachael Fee, MD   2 mg at 11/15/15 1431  . LORazepam (ATIVAN) tablet 1 mg  1 mg Oral Q6H PRN Rachael Fee, MD      . Melene Muller ON 11/17/2015] LORazepam (ATIVAN) tablet 1 mg  1 mg Oral BID Rachael Fee, MD  Followed by  . [START ON 11/18/2015] LORazepam (ATIVAN) tablet 1 mg  1 mg Oral Daily Rachael Fee, MD      . magnesium hydroxide (MILK OF MAGNESIA) suspension 30 mL  30 mL Oral Daily PRN Rachael Fee, MD      . multivitamin with minerals tablet 1 tablet  1 tablet Oral Daily Rachael Fee, MD   1 tablet at 11/16/15 [phone]  . nicotine (NICODERM CQ - dosed in mg/24 hours) patch 21 mg  21 mg Transdermal Daily Rachael Fee, MD   21 mg at 11/16/15 0815  . ondansetron (ZOFRAN-ODT) disintegrating tablet 4 mg  4 mg Oral Q6H PRN Rachael Fee, MD      . thiamine (VITAMIN B-1) tablet 100 mg  100 mg Oral Daily Rachael Fee, MD   100 mg at 11/16/15 1191  . traZODone (DESYREL) tablet 50 mg  50 mg Oral QHS PRN Rachael Fee, MD   50 mg at 11/15/15 2112    Lab Results: No results found for this or any previous  visit (from the past 48 hour(s)).  Blood Alcohol level:  Lab Results  Component Value Date   [facility] 251* 11/14/2015    Physical Findings: AIMS: Facial and Oral Movements Muscles of Facial Expression: None, normal Lips and Perioral Area: None, normal Jaw: None, normal Tongue: None, normal,Extremity Movements Upper (arms, wrists, hands, fingers): None, normal Lower (legs, knees, ankles, toes): None, normal, Trunk Movements Neck, shoulders, hips: None, normal, Overall Severity Severity of abnormal movements (highest score from questions above): None, normal Incapacitation due to abnormal movements: None, normal Patient's awareness of abnormal movements (rate only patient's report): No Awareness, Dental Status Current problems with teeth and/or dentures?: Yes (missing teeth, poor dentition) Does patient usually wear dentures?: No  CIWA:  CIWA-Ar Total: 3 COWS:  COWS Total Score: 4  Musculoskeletal: Strength & Muscle Tone: within normal limits Gait & Station: on a wheel chair after he fell Patient leans: Front  Psychiatric Specialty Exam: Review of Systems  Constitutional: Positive for malaise/fatigue.  HENT: Negative.   Eyes: Negative.   Respiratory: Negative.   Cardiovascular: Negative.   Gastrointestinal: Positive for nausea and diarrhea.  Genitourinary: Negative.   Musculoskeletal: Negative.   Skin: Negative.   Neurological: Positive for weakness.  Endo/Heme/Allergies: Negative.   Psychiatric/Behavioral: Positive for depression and substance abuse. The patient is nervous/anxious.     Blood pressure 133/77, pulse 78, temperature 98 F (36.7 C), temperature source Oral, resp. rate 16, height 5\' 9"  (1.753 m), weight 81.647 kg (180 lb), SpO2 100 %.Body mass index is 26.57 kg/(m^2).  General Appearance: Disheveled  Eye Solicitor::  Fair  Speech:  Clear and Coherent  Volume:  deaf  Mood:  Anxious, Depressed and worried  Affect:  anxious worried in pain  Thought Process:   Coherent and Goal Directed  Orientation:  Full (Time, Place, and Person)  Thought Content:  symptoms events worries concerns  Suicidal Thoughts:  No  Homicidal Thoughts:  No  Memory:  Immediate;   Fair Recent;   Fair Remote;   Fair  Judgement:  Fair  Insight:  Present and Shallow  Psychomotor Activity:  Restlessness  Concentration:  Fair  Recall:  [employer] of Knowledge:Fair  Language: Fair  Akathisia:  No  Handed:  Right  AIMS (if indicated):     Assets:  Desire for Improvement  ADL's:  Intact  Cognition: WNL  Sleep:      Treatment Plan Summary: Daily  contact with patient to assess and evaluate symptoms and progress in treatment and Medication management Supportive approach/coping skills Alcohol dependence; continue the ativan detox protocol/work a relapse prevention plan Twisted ankle; x rays were done will consult PT Pain/swelling; meanwhile ice it and use Tylenol  Explore residential treatment options Jeyson Deshotel A, MD 11/16/2015, 5:04 PM

## 2015-11-16 NOTE — Progress Notes (Signed)
Staff was called to pt's room at approx 2347, when his roommate called to say that the pt had fallen.  Pt reports that he was getting up to go to the bathroom when the corner of the bed tripped him and he fell, hitting his R leg and R arm on the corner of the bed.  Pt is deaf, and so staff had to communicate with him by writing down questions and having him write his answers.  He said that he was dizzy.  He also reported that he has had episodes of dizziness for about a year.  He had received Trazodone 50 mg for sleep along with Ativan 1 mg for alcohol detox withdrawals before going to bed.  Pt's R arm has a raised bruise in the antecubital area where he hit it on the corner of the foot of the bed when he fell.  He has an abrasion on his R shin where he hit the other corner of the bed.  This is what caused his fall.  The pt's vital signs were taken and recorded.  The PA on the unit was notified and assessed the patient.  Neuro checks were done, although the pt did not hit his head.  Pt was given ice for his arm.  He had received Ibuprofen for foot pain prior to going to bed, so a pain med was not given.  Pt was strongly encouraged to not get out of bed unless a staff member was in the room, and he was reminded that the MHT would be in every 15 minutes to check on him.  This was written down for pt to read and he nodded his head that he understood.  Pt is in bed at this time.

## 2015-11-16 NOTE — Progress Notes (Signed)
1;1 observation note:  Patient completed x-rays and they came unremarkable.  He continues to complain on pain in his left foot 7/8 out of 10.  Patient has been pleasant and cooperative.  His interpreter will return tonight for wrap-up group.  He denies SI/HI/AVH.  He is attending groups.  He will continue to be on 1:1 due high fall risk.  Patient may ambulate with wheelchair.

## 2015-11-16 NOTE — [facility] Group Notes (Signed)
The focus of this group is to educate the patient on the purpose and policies of crisis stabilization and provide a format to answer questions about their admission.  The group details unit policies and expectations of patients while admitted.  Patient attended 0900 nurse education orientation group this morning.  Patient actively participated and had appropriate affect.  Patient was alert.  Patient had appropriate insight and appropriate engagement.  Today patient will work on 3 goals for discharge. Patient had interpreter with him during this group session.
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
## 2015-11-16 NOTE — Progress Notes (Signed)
Patient left unit; [facility] transporting to Radiology.

## 2015-11-16 NOTE — Tx Team (Signed)
Interdisciplinary Treatment Plan Update (Adult) Date: 11/16/2015    Time Reviewed: 9:30 AM  Progress in Treatment: Attending groups: Continuing to assess, patient new to milieu Participating in groups: Continuing to assess, patient new to milieu Taking medication as prescribed: Yes Tolerating medication: Yes Family/Significant other contact made: No, CSW assessing for appropriate contacts Patient understands diagnosis: Yes Discussing patient identified problems/goals with staff: Yes Medical problems stabilized or resolved: Yes Denies suicidal/homicidal ideation: Yes Issues/concerns per patient self-inventory: Yes Other:  New problem(s) identified: N/A  Discharge Plan or Barriers: Patient is interested in residential treatment at discharge.   Reason for Continuation of Hospitalization:  Depression Anxiety Medication Stabilization   Comments: N/A  Estimated length of stay: 3-5 days    Patient is a 60 year old male with a diagnosis of substance induced mood disorder, Major Depressive Disorder, and Alcohol Use Disorder. Pt presented to the hospital with suicidal ideations and alcohol abuse. Pt reports primary trigger(s) for admission were housing and relationship stressors, as well as experiencing grief. Patient will benefit from crisis stabilization, medication evaluation, group therapy and psycho education in addition to case management for discharge planning. At discharge, it is recommended that Pt remain compliant with established discharge plan and continued treatment.   Review of initial/current patient goals per problem list:  1. Goal(s): Patient will participate in aftercare plan   Met: Goal progressing   Target date: 3-5 days post admission date   As evidenced by: Patient will participate within aftercare plan AEB aftercare provider and housing plan at discharge being identified.  2/16: Goal progressing. Patient interested in residential treatment.   2. Goal  (s): Patient will exhibit decreased depressive symptoms and suicidal ideations.   Met: Goal progressing   Target date: 3-5 days post admission date   As evidenced by: Patient will utilize self rating of depression at 3 or below and demonstrate decreased signs of depression or be deemed stable for discharge by MD.   2/16: Goal progressing. Patient continues to endorse depression but is attending groups.     4. Goal(s): Patient will demonstrate decreased signs of withdrawal due to substance abuse   Met: No   Target date: 3-5 days post admission date   As evidenced by: Patient will produce a CIWA/COWS score of 0, have stable vitals signs, and no symptoms of withdrawal  2/16: Goal not met: Pt continues to have withdrawal symptoms of headache, anxiety, and tremor and a CIWA score of a 3.  Pt to show decrease withdrawal symptoms prior to d/c.      Attendees: Patient:    Family:    Physician: Dr. Parke Poisson; Dr. Sabra Heck 11/16/2015 9:30 AM  Nursing: Grayland Ormond, [address] [place], [place]  11/16/2015 9:30 AM  Clinical Social Worker: Tilden Fossa, LCSW 11/16/2015 9:30 AM  Other: Peri Maris, LCSWA; [place], LCSW  11/16/2015 9:30 AM  Other:  11/16/2015 9:30 AM  Other:    Other: Larose Kells, NP 11/16/2015 9:30 AM                    Scribe for Treatment Team:  Tilden Fossa, [place]

## 2015-11-16 NOTE — [facility] Group Notes (Signed)
[facility] LCSW Group Therapy 11/16/2015 1:15 PM Type of Therapy: Group Therapy Participation Level: Active  Participation Quality: Attentive, Sharing and Supportive  Affect: Depressed and Flat  Cognitive: Alert and Oriented  Insight: Developing/Improving and Engaged  Engagement in Therapy: Developing/Improving and Engaged  Modes of Intervention: Activity, Clarification, Confrontation, Discussion, Education, Exploration, Limit-setting, Orientation, Problem-solving, Rapport Building, Reality Testing, Socialization and Support  Summary of Progress/Problems: Patient was attentive and engaged with speaker from [employer]. Patient was attentive to speaker while they shared their story of dealing with mental health and overcoming it. Patient expressed interest in their programs and services and received information on their agency. Patient processed ways they can relate to the speaker.   D'Arcy Abraha, LCSW Clinical Social Worker [facility] [phone]   

## 2015-11-16 NOTE — Progress Notes (Signed)
1:1 observation note:  Patient continues to complain of foot pain.  He went for x-rays of his left foot/ankle and right index finger.  Patient given ibuprofen X2.  Patient appears depressed and sad.  He is cooperative and pleasant.  His interpreter is assisting him with sign language.  He denies SI/HI/AVH.  Patient is currently in day room with his leg propped.  Cold packs offered as needed.  Continue 1:1 due high fall risk.

## 2015-11-16 NOTE — Progress Notes (Signed)
NUTRITION ASSESSMENT  Pt identified as at risk on the Malnutrition Screen Tool  INTERVENTION: 1. Supplements: Ensure Enlive po TID, each supplement provides 350 kcal and 20 grams of protein  NUTRITION DIAGNOSIS: Unintentional weight loss related to sub-optimal intake as evidenced by pt report.   Goal: Pt to meet >/= 90% of their estimated nutrition needs.  Monitor:  PO intake  Assessment:  Pt admitted with ETOH abuse and depression. Per H&P, pt has been consuming 3-6 40oz beers daily since 2014. Pt has not been eating well while drinking. Pt is currently homeless and deaf. No weight history prior to admission. Suspect poor quality diet PTA. RD to increase order of Ensure to TID to provide extra kcal and protein.  Height: Ht Readings from Last 1 Encounters:  11/15/15  (1.753 m)    Weight: Wt Readings from Last 1 Encounters:  11/15/15 180 lb (81.647 kg)    Weight Hx: Wt Readings from Last 10 Encounters:  11/15/15 180 lb (81.647 kg)  11/14/15 180 lb (81.647 kg)    BMI:  Body mass index is 26.57 kg/(m^2). Pt meets criteria for overweight based on current BMI.  Estimated Nutritional Needs: Kcal: 25-30 kcal/kg Protein: > 1 gram protein/kg Fluid: 1 ml/kcal  Diet Order: Diet regular Room service appropriate?: Yes; Fluid consistency:: Thin Pt is also offered choice of unit snacks mid-morning and mid-afternoon.  Pt is eating as desired.   Lab results and medications reviewed.   Tilda Franco, MS, RD, LDN Pager: [phone] After Hours Pager: [phone]

## 2015-11-16 NOTE — Plan of Care (Signed)
Problem: Ineffective individual coping Goal: STG: Patient will remain free from self harm Outcome: Progressing Patient denies suicidal ideation.  He contracts for safety on the unit.

## 2015-11-17 MED ORDER — CITALOPRAM HYDROBROMIDE 10 MG PO TABS
10.0000 mg | ORAL_TABLET | Freq: Every day | ORAL | Status: DC
  Administered 2015-11-18 – 2015-11-19 (×2): 10 mg via ORAL
  Filled 2015-11-17 (×4): qty 1

## 2015-11-17 NOTE — Progress Notes (Signed)
Pt attended evening AA group. Interpreter at side as well as 1:1 sitter.

## 2015-11-17 NOTE — Progress Notes (Signed)
D: Pt is resting in her room with eyes closed. No distress noted. Respirations are even and unlabored. A: 1:1 staff remains with pt for safety.  R: Pt remains safe on the unit.   

## 2015-11-17 NOTE — [facility] Suicide Risk Assessment (Signed)
[facility] INPATIENT:  Family/Significant Other Suicide Prevention Education  Suicide Prevention Education:  Contact Attempts: girlfriend Gerre Scull [phone], (name of family member/significant other) has been identified by the patient as the family member/significant other with whom the patient will be residing, and identified as the person(s) who will aid the patient in the event of a mental health crisis.  With written consent from the patient, two attempts were made to provide suicide prevention education, prior to and/or following the patient's discharge.  We were unsuccessful in providing suicide prevention education.  A suicide education pamphlet was given to the patient to share with family/significant other.  Date and time of first attempt: 11/16/15 at 3:30pm Date and time of second attempt: 11/17/15 at 12:40pm  Dalissa Lovin, [place] 11/17/2015, 12:38 PM

## 2015-11-17 NOTE — Progress Notes (Signed)
Referral made to [facility].   [name], LCSW Clinical Social Worker [facility] [phone]

## 2015-11-17 NOTE — Progress Notes (Signed)
Recreation Therapy Notes   Date: 02.17.2017 Time: 9:30am Location: [address] Group Room   Group Topic: Stress Management  Goal Area(s) Addresses:  Patient will actively participate in stress management techniques presented during session.   Behavioral Response: Did not attend.   Dorell Gatlin L Cora Brierley, LRT/CTRS         Rondia Higginbotham L 11/17/2015 2:57 PM 

## 2015-11-17 NOTE — Evaluation (Signed)
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 He is seeking help to stop. He stated he feel about 6 weeks ago and hurt his ankle and then fell here 2 days ago and hurt his Left ankle again. He also has difficulty walking at this time due to his ank;le and also reports feeling dixxy at times recently when he stands or tries to walk.   Clinical Impression  Pt with swelling in Left ankle and tenderness along inferior left lateral malleolus. Educated and instructed on how to use a RW with ambulation to eliminate some weight bearing next few days for healing. He can progress off RW as he feels he can tolerate. Recommend ice and if nursing would like to freeze some water in bottom of some cups , he could do ice massage along that area of his ankle for 5 minute increments. Also recommend finger ice baths in a cup for 5 minutes for his knuckle swelling. Also recommended ankle brace for stability and swelling, and Dr. Dub Mikes agree and order placed and ortho tech called. Will f/u with pt next week if still here to see how he is progressing.  Thanks     Follow Up Recommendations No PT follow up    Equipment Recommendations       Recommendations for Other Services       Precautions / Restrictions        Mobility  Bed Mobility                  Transfers Overall transfer level: Needs assistance Equipment used: Rolling walker (2 wheeled) Transfers: Sit to/from Stand Sit to Stand: Min guard         General transfer comment: educated on how to safely sit to stand with RW .   Ambulation/Gait Ambulation/Gait assistance: Min guard Ambulation Distance (Feet): 60 Feet Assistive device: Rolling walker (2 wheeled) Gait Pattern/deviations: Step-to pattern     General Gait Details: limited L weight bearing a little due to  pain. Pt did try to weight bear a little as he can tolerate. Educated with step to pattern to decrease pain at this time.   Stairs            Wheelchair Mobility    Modified Rankin (Stroke Patients Only)       Balance Overall balance assessment: History of Falls (educated to use RW while here and have 1:1 with him when he tries to walk. )                                           Pertinent Vitals/Pain Pain Assessment: 0-10 Pain Score: 5  Pain Location: Left ankle  Pain Descriptors / Indicators: Sore Pain Intervention(s): Monitored during session;Ice applied    Home Living   Living Arrangements: Alone               Additional Comments: did discuss a lot with pt, chart states pt may be homeless or live with his girlfriend, however it also states that have broken up. So unclear of his DC distination.     Prior Function Level of Independence: Independent         Comments: pt stated he did fine with mobilitya dn getting  around.      Hand Dominance        Extremity/Trunk Assessment               Lower Extremity Assessment: Overall WFL for tasks assessed;LLE deficits/detail   LLE Deficits / Details: able to AROM in all directions just limited to end range adn very guarded and sore. Educated to not do this too much over next few days to let it heal and may try gentle ROM starting monday. Swelling noted on L ankle and very sensitve to palpation around distal border of lateral malleolus     Communication   Communication: Deaf (interpreter present during entire session )  Cognition Arousal/Alertness: Awake/alert Behavior During Therapy: WFL for tasks assessed/performed Overall Cognitive Status: Within Functional Limits for tasks assessed                      General Comments      Exercises        Assessment/Plan    PT Assessment Patient needs continued PT services (maybe one more visit to see how ankle is progressing  and see if he can transition off RW. If we don't see him befoe he discharges, that is fine. If he progresses off RW at his tolerance, that is fine as well. )  PT Diagnosis Difficulty walking;Acute pain   PT Problem List Decreased range of motion;Decreased activity tolerance;Decreased mobility  PT Treatment Interventions DME instruction;Functional mobility training;Therapeutic activities;Therapeutic exercise   PT Goals (Current goals can be found in the Care Plan section) Acute Rehab PT Goals Patient Stated Goal: I want to be able to walk again and get around PT Goal Formulation: With patient Time For Goal Achievement: 11/24/15 Potential to Achieve Goals: Good    Frequency Min 2X/week   Barriers to discharge        Co-evaluation               End of Session Equipment Utilized During Treatment: Gait belt Activity Tolerance: Patient tolerated treatment well Patient left: in bed;with nursing/sitter in room Nurse Communication:  (talked with Dr. Dub Mikes after sesssion )    Functional Assessment Tool Used: clinical judgement Functional Limitation: Mobility: Walking and moving around Mobility: Walking and Moving Around Current Status (Z6109): At least 1 percent but less than 20 percent impaired, limited or restricted Mobility: Walking and Moving Around Goal Status [phone]): At least 1 percent but less than 20 percent impaired, limited or restricted    Time: 1330-1410 PT Time Calculation (min) (ACUTE ONLY): 40 min   Charges:   PT Evaluation $PT Eval Low Complexity: 1 Procedure PT Treatments $Gait Training: 8-22 mins   PT G Codes:   PT G-Codes **NOT FOR INPATIENT CLASS** Functional Assessment Tool Used: clinical judgement Functional Limitation: Mobility: Walking and moving around Mobility: Walking and Moving Around Current Status (U9811): At least 1 percent but less than 20 percent impaired, limited or restricted Mobility: Walking and Moving Around Goal Status [phone]): At least 1  percent but less than 20 percent impaired, limited or restricted    Lee [name] 11/17/2015, 4:08 PM Lee [name], PT Pager: [phone] 11/17/2015

## 2015-11-17 NOTE — Progress Notes (Signed)
D: Patient resting in bed with eyes open. Patient communicating with sitter and staff through interpreter while he was here and writing when interpreter was gone. Patient denies SI/HI and A/V hallucinations, stated he was feeling SI but not currently. Patient verbalizes understanding to keep left ankle elevated and iced to help with the pain.  A: Continue monitoring 1:1 for safety. Meds given as scheduled and prn. R: Patient remains safe.

## 2015-11-17 NOTE — Progress Notes (Signed)
D: Patient alert and oriented x 4. Patient denies SI/HI/AVH. Patient complained of pain in left knee. PRN pain medications given.  A: Staff to monitor Q 15 mins for safety. Encouragement and support offered. Scheduled medications administered per orders. R: Patient remains safe on the unit. Patient attended group tonight. Patient visible on the unit. Patient taking administered medications.

## 2015-11-17 NOTE — Progress Notes (Signed)
D: Pt is resting in bed. Ibuprofen given for pain in left ankle 8/10. Ice pack applied.  A: 1:1 staff remains with pt for safety.  R: Pt remains safe on the unit.

## 2015-11-17 NOTE — [facility] Group Notes (Signed)
[facility] LCSW Group Therapy  11/17/2015 3:00 PM  Type of Therapy:  Group Therapy  Participation Level:  Did Not Attend-pt chose to remain in bed. Invited.   Summary of Progress/Problems: Feelings around Relapse. Group members discussed the meaning of relapse and shared personal stories of relapse, how it affected them and others, and how they perceived themselves during this time. Group members were encouraged to identify triggers, warning signs and coping skills used when facing the possibility of relapse. Social supports were discussed and explored in detail.  Smart, Evola Hollis LCSW 11/17/2015, 3:00 PM

## 2015-11-17 NOTE — Progress Notes (Signed)
D: Patient is resting in his room with his eyes closed. No distress noted. Respirations regular and unlabored. Patient did awaken to eat approximately 60% of his breakfast and take his scheduled medications. A: Continue monitoring 1:1 for patient safety. R: Patient remains safe.

## 2015-11-17 NOTE — [facility] Group Notes (Signed)
[facility] LCSW Aftercare Discharge Planning Group Note  11/17/2015  8:45 AM  Participation Quality: Did Not Participate as interpreter not available.  [name], MSW, LCSW Clinical Social Worker [facility] [phone]

## 2015-11-17 NOTE — Progress Notes (Signed)
D: Patient has been up in dayroom. Patient complained of pain 9/10 Ibuprofen given at 1954. Reassessment Patient's pain was 7/10. PRN tylenol given at 2112.  A: 1:1 staff remains with pt for safety.  R: Pt remains safe on the unit.

## 2015-11-17 NOTE — Progress Notes (Signed)
D: Patient is resting in his room with his eyes closed. No distress noted. Respirations regular and unlabored. A: Continue monitoring 1:1 for patient safety. R: Patient remains safe.  Darryel Diodato, Wyman Songster, RN

## 2015-11-17 NOTE — Progress Notes (Signed)
D: patient is sitting in the dayroom interacting with interpreter and sitter. Patient still complains of Left foot/ankle pain. Patient continues to deny SI/HI and A/V hallucination.  A: Continue monitoring 1:1 for safety. Medications given as scheduled and prn. R: patient remains safe.  Deanthony Maull, Wyman Songster, RN

## 2015-11-17 NOTE — Progress Notes (Signed)
[facility] MD Progress Note  11/17/2015 6:46 PM Lee [name]  MRN:  [id number] Subjective:  Garik continues to be detox. He was evaluated by PT. He is still having some tremors. He slept a little better last night. He is starting to eat and keep the food Principal Problem: Severe recurrent major depression without psychotic features [facility]) Diagnosis:   Patient Active Problem List   Diagnosis Date Noted  . Alcohol dependence (HCC) [F10.20] 11/15/2015  . Severe recurrent major depression without psychotic features (HCC) [F33.2] 11/15/2015   Total Time spent with patient: 20 minutes  Past Psychiatric History: see admission H and P  Past Medical History:  Past Medical History  Diagnosis Date  . Deaf     Past Surgical History  Procedure Laterality Date  . Neck surgery     Family History: History reviewed. No pertinent family history. Family Psychiatric  History: see Admission H and P Social History:  History  Alcohol Use  . 3.6 oz/week  . 6 Cans of beer per week     History  Drug Use No    Social History   Social History  . Marital Status: Divorced    Spouse Name: N/A  . Number of Children: N/A  . Years of Education: N/A   Social History Main Topics  . Smoking status: Current Every Day Smoker  . Smokeless tobacco: None  . Alcohol Use: 3.6 oz/week    6 Cans of beer per week  . Drug Use: No  . Sexual Activity: Not Asked   Other Topics Concern  . None   Social History Narrative   Additional Social History:    Pain Medications: Given Ibuprofen at ED Prescriptions: see PTA meds Over the Counter: denies History of alcohol / drug use?: Yes Longest period of sobriety (when/how long): pt reports being in rehab in 1999 Negative Consequences of Use: Legal, Personal relationships, Financial Withdrawal Symptoms: Agitation, Seizures, Tremors, Diarrhea Name of Substance 1: Alcohol 1 - Age of First Use: conflicting reports "1999" to one staff, reports to Clinical research associate "2014" 1 -  Amount (size/oz): 3 "tall boys" a day 1 - Frequency: daily 1 - Duration: ongoing 1 - Last Use / Amount: 11/13/15                  Sleep: improving  Appetite:  improving  Current Medications: Current Facility-Administered Medications  Medication Dose Route Frequency Provider Last Rate Last Dose  . acetaminophen (TYLENOL) tablet 650 mg  650 mg Oral Q6H PRN Rachael Fee, MD   650 mg at 11/17/15 1639  . alum & mag hydroxide-simeth (MAALOX/MYLANTA) 200-200-20 MG/5ML suspension 30 mL  30 mL Oral Q4H PRN Rachael Fee, MD      . feeding supplement (ENSURE ENLIVE) (ENSURE ENLIVE) liquid 237 mL  237 mL Oral TID BM Rachael Fee, MD   237 mL at 11/17/15 1321  . hydrOXYzine (ATARAX/VISTARIL) tablet 25 mg  25 mg Oral Q6H PRN Rachael Fee, MD      . ibuprofen (ADVIL,MOTRIN) tablet 600 mg  600 mg Oral Q4H PRN Rachael Fee, MD   600 mg at 11/17/15 1750  . loperamide (IMODIUM) capsule 2 mg  2 mg Oral PRN Rachael Fee, MD   2 mg at 11/15/15 1431  . LORazepam (ATIVAN) tablet 1 mg  1 mg Oral Q6H PRN Rachael Fee, MD      . Melene Muller ON 11/18/2015] LORazepam (ATIVAN) tablet 1 mg  1 mg Oral Daily Madie Reno  A Yuri Flener, MD      . magnesium hydroxide (MILK OF MAGNESIA) suspension 30 mL  30 mL Oral Daily PRN Rachael Fee, MD      . multivitamin with minerals tablet 1 tablet  1 tablet Oral Daily Rachael Fee, MD   1 tablet at 11/17/15 0758  . nicotine (NICODERM CQ - dosed in mg/24 hours) patch 21 mg  21 mg Transdermal Daily Rachael Fee, MD   21 mg at 11/17/15 0815  . ondansetron (ZOFRAN-ODT) disintegrating tablet 4 mg  4 mg Oral Q6H PRN Rachael Fee, MD      . thiamine (VITAMIN B-1) tablet 100 mg  100 mg Oral Daily Rachael Fee, MD   100 mg at 11/17/15 0758  . traZODone (DESYREL) tablet 50 mg  50 mg Oral QHS PRN Rachael Fee, MD   50 mg at 11/16/15 2112    Lab Results: No results found for this or any previous visit (from the past 48 hour(s)).  Blood Alcohol level:  Lab Results  Component Value Date    [facility] 251* 11/14/2015    Physical Findings: AIMS: Facial and Oral Movements Muscles of Facial Expression: None, normal Lips and Perioral Area: None, normal Jaw: None, normal Tongue: None, normal,Extremity Movements Upper (arms, wrists, hands, fingers): None, normal Lower (legs, knees, ankles, toes): None, normal, Trunk Movements Neck, shoulders, hips: None, normal, Overall Severity Severity of abnormal movements (highest score from questions above): None, normal Incapacitation due to abnormal movements: None, normal Patient's awareness of abnormal movements (rate only patient's report): No Awareness, Dental Status Current problems with teeth and/or dentures?: Yes (missing teeth, poor dentition) Does patient usually wear dentures?: No  CIWA:  CIWA-Ar Total: 1 COWS:  COWS Total Score: 4  Musculoskeletal: Strength & Muscle Tone: within normal limits Gait & Station: trauma to Left ankle  Patient leans: on wheel chair  Psychiatric Specialty Exam: Review of Systems  Constitutional: Positive for malaise/fatigue.  HENT: Negative.   Eyes: Negative.   Respiratory: Negative.   Cardiovascular: Negative.   Gastrointestinal: Negative.   Genitourinary: Negative.   Musculoskeletal: Positive for joint pain.  Skin: Negative.   Neurological: Positive for tremors.  Endo/Heme/Allergies: Negative.   Psychiatric/Behavioral: Positive for depression and substance abuse. The patient is nervous/anxious.     Blood pressure 152/82, pulse 59, temperature 97.9 F (36.6 C), temperature source Oral, resp. rate 20, height 5\' 9"  (1.753 m), weight 81.647 kg (180 lb), SpO2 98 %.Body mass index is 26.57 kg/(m^2).  General Appearance: Disheveled  Eye Solicitor::  Fair  Speech:  Deaf  Volume:  volume of sounds fluctuates  Mood:  Anxious and worried  Affect:  anxious worried  Thought Process:  Coherent and Goal Directed  Orientation:  Full (Time, Place, and Person)  Thought Content:  answers questions    Suicidal Thoughts:  No  Homicidal Thoughts:  No  Memory:  Immediate;   Fair Recent;   Fair Remote;   Fair  Judgement:  Fair  Insight:  Present and Shallow  Psychomotor Activity:  Restlessness  Concentration:  Fair  Recall:  [employer] of Knowledge:Fair  Language: by interpreter fair  Akathisia:  No  Handed:  Right  AIMS (if indicated):     Assets:  Desire for Improvement  ADL's:  Intact  Cognition: WNL  Sleep:  Number of Hours: 5.75   Treatment Plan Summary: Daily contact with patient to assess and evaluate symptoms and progress in treatment and Medication management Supportive approach/coping skills  Alcohol dependence; continue the Ativan detox protocol/work a relapse prevention plan Ankle sprain ; will follow PT recommendations Depression; will start Celexa 10 mg with plans to increase to 20 mg Work with CBT/mindfulnes Explore residential treatment options (GF is at [facility], a therapeutic shelter) Rachael Fee, MD 11/17/2015, 6:46 PM

## 2015-11-18 DIAGNOSIS — F332 Major depressive disorder, recurrent severe without psychotic features: Principal | ICD-10-CM

## 2015-11-18 DIAGNOSIS — F1020 Alcohol dependence, uncomplicated: Secondary | ICD-10-CM

## 2015-11-18 MED ORDER — HYDROXYZINE HCL 25 MG PO TABS
25.0000 mg | ORAL_TABLET | Freq: Four times a day (QID) | ORAL | Status: DC | PRN
  Administered 2015-11-18: 25 mg via ORAL

## 2015-11-18 NOTE — Progress Notes (Signed)
D: Patient is resting in bed with eyes closed. No sign or symptoms of distress.  A: 1:1 staff remains with pt for safety.  R: Pt remains safe on the unit.   

## 2015-11-18 NOTE — Progress Notes (Signed)
1:1 Note  D) Pt has been doing well. Continues to complain of his left ankle hurt and gets Motrin every 4 hours. Is pleasant and is keeping himself focused on doing what he needs to do to get better. Affect brightens with interaction. Continues to deny SI and HI. A) Given support, reassurance and praise. Encouragement provided R) Remains safe.

## 2015-11-18 NOTE — Progress Notes (Signed)
1:1 Nursing note -Patient has been up in the dayroom watching tv with no interaction. He attended AA meeting with the interpreter present. She informed him of his scheduled medication and prn medications available. He denies si/hi/a/v hallucinations. He reports just feeling depressed. He reported to interpreter that his social worker is helping him possibly go to a rehabilitation facility upon discharge. He requested medication for pain which he received, will monitor effectiveness of medication. Support given and safety maintained on unit with 15 min checks.

## 2015-11-18 NOTE — [facility] Group Notes (Signed)
[facility] Group Notes:  (Clinical Social Work)   07/29/2015     10:00-11:00AM  Summary of Progress/Problems:   In today's process group patients listed needs that human beings have, then listed healthy and unhealthy coping techniques, determining that unhealthy coping techniques work initially, but eventually become harmful.  There was then a discussion of various ideas of healthy coping techniques and how to go about switching from unhealthy to healthy choices.  Motivational Interviewing and the whiteboard were utilized for the exercises.  The patient expressed that the unhealthy coping he often uses is drinking alcohol because he lost both his son and his daughter to overdoses.  He used his interpreter to contribute heavily and positive to group.  Type of Therapy:  Group Therapy - Process   Participation Level:  Active  Participation Quality:  Attentive and Sharing  Affect:  Appropriate  Cognitive:  Appropriate and Oriented  Insight:  Engaged  Engagement in Therapy:  Engaged  Modes of Intervention:  Education, Motivational Interviewing  Ambrose Mantle, LCSW 11/18/2015, 12:17 PM

## 2015-11-18 NOTE — [facility] Group Notes (Signed)
[place] Group Notes:  (Nursing/MHT/Case Management/Adjunct)  Date:  11/18/2015  Time:  1315  Type of Therapy:  Nurse Education  /  Life Skills : The group is focused on teaching patients how to identify their needs as well as identify the healthy coping skills needed to get their needs met.  Participation Level:  Active  Participation Quality:  Appropriate  Affect:  Appropriate  Cognitive:  Alert  Insight:  Good  Engagement in Group:  Engaged  Modes of Intervention:  Discussion and Education  Summary of Progress/Problems:  Lee [name] 11/18/2015, 4:43 PM

## 2015-11-18 NOTE — Progress Notes (Signed)
1:1 NOTE  D) Pt has been friendly and warm. Interacts appropriately with his sitter and with his interpreter. Participates in the dayroom and when able, interacts with his peers. Affect and mood remain appropriate. Pt denies SI and HI. A) Given support and reassurance. Frequent contract with Pt. R) Pt remains safe.

## 2015-11-18 NOTE — Progress Notes (Signed)
D: Patient alert and oriented x 4. Patient denies SI/HI/AVH. Patient complained of pain in left ankle 8/10. Ice pack was placed on area and PRN ibuprofen given at 2201, when reassessed patient was in rest resting with eyes closed in bed. Patient continues to be on 1:1 for safety. This writer placed ankle brace on ankle before group, and patient was able to walk to day room.  A: Staff to monitor Q 15 mins for safety. Encouragement and support offered. Scheduled medications administered per orders. R: Patient remains safe on the unit. Patient attended group tonight. Patient visible on the unit. Patient taking administered medications.

## 2015-11-18 NOTE — Progress Notes (Signed)
Patient ID: Lee [name], male   DOB: July 30, 1956, 60 y.o.   MRN: [id number] [facility] MD Progress Note  11/18/2015 12:16 PM Lee [name]  MRN:  [id number].  Subjective:  Lee [name] continues to be detox. Walks with walker. interpretator was here since patient deaf and helped in assessment. Tolerating withdrawals. Mood not hopeless . Less anxious. Slept fair . Remains cooperative  Principal Problem: Severe recurrent major depression without psychotic features (HCC) Diagnosis:   Patient Active Problem List   Diagnosis Date Noted  . Alcohol dependence (HCC) [F10.20] 11/15/2015  . Severe recurrent major depression without psychotic features (HCC) [F33.2] 11/15/2015   Total Time spent with patient: 20 minutes  Past Psychiatric History: see admission H and P  Past Medical History:  Past Medical History  Diagnosis Date  . Deaf     Past Surgical History  Procedure Laterality Date  . Neck surgery     Family History: History reviewed. No pertinent family history. Family Psychiatric  History: see Admission H and P Social History:  History  Alcohol Use  . 3.6 oz/week  . 6 Cans of beer per week     History  Drug Use No    Social History   Social History  . Marital Status: Divorced    Spouse Name: N/A  . Number of Children: N/A  . Years of Education: N/A   Social History Main Topics  . Smoking status: Current Every Day Smoker  . Smokeless tobacco: None  . Alcohol Use: 3.6 oz/week    6 Cans of beer per week  . Drug Use: No  . Sexual Activity: Not Asked   Other Topics Concern  . None   Social History Narrative   Additional Social History:    Pain Medications: Given Ibuprofen at ED Prescriptions: see PTA meds Over the Counter: denies History of alcohol / drug use?: Yes Longest period of sobriety (when/how long): pt reports being in rehab in 1999 Negative Consequences of Use: Legal, Personal relationships, Financial Withdrawal Symptoms: Agitation, Seizures, Tremors,  Diarrhea Name of Substance 1: Alcohol 1 - Age of First Use: conflicting reports "1999" to one staff, reports to Clinical research associate "2014" 1 - Amount (size/oz): 3 "tall boys" a day 1 - Frequency: daily 1 - Duration: ongoing 1 - Last Use / Amount: 11/13/15                  Sleep: improving  Appetite:  improving  Current Medications: Current Facility-Administered Medications  Medication Dose Route Frequency Provider Last Rate Last Dose  . acetaminophen (TYLENOL) tablet 650 mg  650 mg Oral Q6H PRN Rachael Fee, MD   650 mg at 11/17/15 1639  . alum & mag hydroxide-simeth (MAALOX/MYLANTA) 200-200-20 MG/5ML suspension 30 mL  30 mL Oral Q4H PRN Rachael Fee, MD      . citalopram (CELEXA) tablet 10 mg  10 mg Oral Daily Rachael Fee, MD   10 mg at 11/18/15 0826  . feeding supplement (ENSURE ENLIVE) (ENSURE ENLIVE) liquid 237 mL  237 mL Oral TID BM Rachael Fee, MD   237 mL at 11/17/15 2201  . hydrOXYzine (ATARAX/VISTARIL) tablet 25 mg  25 mg Oral Q6H PRN Rachael Fee, MD      . ibuprofen (ADVIL,MOTRIN) tablet 600 mg  600 mg Oral Q4H PRN Rachael Fee, MD   600 mg at 11/18/15 0830  . loperamide (IMODIUM) capsule 2 mg  2 mg Oral PRN Rachael Fee, MD   2 mg  at 11/15/15 1431  . LORazepam (ATIVAN) tablet 1 mg  1 mg Oral Q6H PRN Rachael Fee, MD      . magnesium hydroxide (MILK OF MAGNESIA) suspension 30 mL  30 mL Oral Daily PRN Rachael Fee, MD      . multivitamin with minerals tablet 1 tablet  1 tablet Oral Daily Rachael Fee, MD   1 tablet at 11/18/15 [phone]  . nicotine (NICODERM CQ - dosed in mg/24 hours) patch 21 mg  21 mg Transdermal Daily Rachael Fee, MD   21 mg at 11/18/15 9604  . ondansetron (ZOFRAN-ODT) disintegrating tablet 4 mg  4 mg Oral Q6H PRN Rachael Fee, MD      . thiamine (VITAMIN B-1) tablet 100 mg  100 mg Oral Daily Rachael Fee, MD   100 mg at 11/18/15 0826  . traZODone (DESYREL) tablet 50 mg  50 mg Oral QHS PRN Rachael Fee, MD   50 mg at 11/17/15 2201    Lab Results: No  results found for this or any previous visit (from the past 48 hour(s)).  Blood Alcohol level:  Lab Results  Component Value Date   [facility] 251* 11/14/2015    Physical Findings: AIMS: Facial and Oral Movements Muscles of Facial Expression: None, normal Lips and Perioral Area: None, normal Jaw: None, normal Tongue: None, normal,Extremity Movements Upper (arms, wrists, hands, fingers): None, normal Lower (legs, knees, ankles, toes): None, normal, Trunk Movements Neck, shoulders, hips: None, normal, Overall Severity Severity of abnormal movements (highest score from questions above): None, normal Incapacitation due to abnormal movements: None, normal Patient's awareness of abnormal movements (rate only patient's report): No Awareness, Dental Status Current problems with teeth and/or dentures?: No Does patient usually wear dentures?: No  CIWA:  CIWA-Ar Total: 0 COWS:  COWS Total Score: 4  Musculoskeletal: Strength & Muscle Tone: within normal limits Gait & Station: trauma to Left ankle  Patient leans: on wheel chair  Psychiatric Specialty Exam: Review of Systems  Constitutional: Positive for malaise/fatigue.  Musculoskeletal: Positive for joint pain.  Skin: Negative for rash.  Neurological: Positive for tremors. Negative for headaches.  Endo/Heme/Allergies: Negative.   Psychiatric/Behavioral: Positive for depression and substance abuse. The patient is nervous/anxious.     Blood pressure 146/99, pulse 109, temperature 98 F (36.7 C), temperature source Oral, resp. rate 16, height  (1.753 m), weight 81.647 kg (180 lb), SpO2 98 %.Body mass index is 26.57 kg/(m^2).  General Appearance: Disheveled  Eye Solicitor::  Fair  Speech:  Deaf  Volume:  volume of sounds fluctuates  Mood:  Somewhat anxious  Affect:  anxious worried  Thought Process:  Coherent and Goal Directed  Orientation:  Full (Time, Place, and Person)  Thought Content:  answers questions   Suicidal Thoughts:  No   Homicidal Thoughts:  No  Memory:  Immediate;   Fair Recent;   Fair Remote;   Fair  Judgement:  Fair  Insight:  Present and Shallow  Psychomotor Activity:  Restlessness  Concentration:  Fair  Recall:  [employer] of Knowledge:Fair  Language: by interpreter fair  Akathisia:  No  Handed:  Right  AIMS (if indicated):     Assets:  Desire for Improvement  ADL's:  Intact  Cognition: WNL  Sleep:  Number of Hours: 6.5   Treatment Plan Summary: Daily contact with patient to assess and evaluate symptoms and progress in treatment and Medication management Supportive approach/coping skills Alcohol dependence; continue the Ativan detox protocol/work a  relapse prevention plan Ankle sprain ; will follow PT recommendations Depression; continue celexa  will consider increase by tomorrow. Work with CBT/mindfulnes Explore residential treatment options (GF is at [facility], a therapeutic shelter) Thresa Ross, MD 11/18/2015, 12:16 PM

## 2015-11-18 NOTE — Progress Notes (Signed)
1:1 NOTE  D) Pt has been up and to the medication window this morning. States that he is having pain in his left ankle. Affect and mood are appropriate. Pt denies SI and HI presently. A) Provided with a brief 1:1. Given support and reassurance. R) Pt remains safe.

## 2015-11-19 DIAGNOSIS — F10230 Alcohol dependence with withdrawal, uncomplicated: Secondary | ICD-10-CM

## 2015-11-19 MED ORDER — CITALOPRAM HYDROBROMIDE 20 MG PO TABS
20.0000 mg | ORAL_TABLET | Freq: Every day | ORAL | Status: DC
  Administered 2015-11-20 – 2015-11-21 (×2): 20 mg via ORAL
  Filled 2015-11-19 (×4): qty 1

## 2015-11-19 MED ORDER — CITALOPRAM HYDROBROMIDE 10 MG PO TABS
10.0000 mg | ORAL_TABLET | Freq: Once | ORAL | Status: AC
  Administered 2015-11-19: 10 mg via ORAL
  Filled 2015-11-19: qty 1

## 2015-11-19 NOTE — Progress Notes (Signed)
11 NOTE  D) Pt has been attending the groups and interacting with his peers appropriately with the help of his 1:1 staff and by the other patients making an attempt to communicate with Pt. Pt signs that he is invested in his sobriety and his growth. He has verbalized his concern about going to his court hearing. Pt feels that he  he was set up by others and is worried that the courts will not believe him. Also verbalized wanting to go to a 90 day program so that he can get more help with his addiction.  A) Given support and reassurance along with praise. Provided with a 1:1. Was also given a student nurse so that she might spend more time working with him. R) Pt remains on a 1:1 for his safety.

## 2015-11-19 NOTE — [facility] Group Notes (Signed)
[facility] Group Notes:  (Clinical Social Work)  11/19/2015  10:00-11:00AM  Summary of Progress/Problems:   The main focus of today's process group was to   1)  discuss the importance of adding supports  2)  define health supports versus unhealthy supports  3)  identify the patient's current unhealthy supports and plan how to handle them  4)  Identify the patient's current healthy supports and plan what to add.  An emphasis was placed on using counselor, doctor, therapy groups, 12-step groups, and problem-specific support groups to expand supports.    The patient expressed full comprehension of the concepts presented, and agreed that there is a need to add more supports.  The patient stated his family is a healthy support.  He interacted well through his interpreter.  Type of Therapy:  Process Group with Motivational Interviewing  Participation Level:  Active  Participation Quality:  Attentive and Sharing  Affect:  Blunted  Cognitive:  Appropriate and Oriented  Insight:  Engaged  Engagement in Therapy:  Engaged  Modes of Intervention:   Education, Teacher, English as a foreign language, Activity  Ambrose Mantle, LCSW 11/19/2015

## 2015-11-19 NOTE — Progress Notes (Signed)
1:1 Note  D) Pt has been sitting in the dayroom eating his dinner. Denies SI and HI. Affect and mood are pleasant A) Given support. Remains on the 1:1 for his safety R) Denies SI and HI. Has remained safe.

## 2015-11-19 NOTE — Progress Notes (Signed)
Patient ID: Lee [name], male   DOB: 07-21-1956, 60 y.o.   MRN: [id number] [facility] MD Progress Note  11/19/2015 12:35 PM Lee [name]  MRN:  [id number].  Subjective:  Andrez continues to be detoxed . Walks with walker. interpretator was here since patient deaf and helped in assessment. Says somewhat down but it fluctuates. Energy low . Tolerating withdrawals with some nausea in morning.  Mood not hopeless . Remains cooperative  Principal Problem: Severe recurrent major depression without psychotic features (HCC) Diagnosis:   Patient Active Problem List   Diagnosis Date Noted  . Alcohol dependence (HCC) [F10.20] 11/15/2015  . Severe recurrent major depression without psychotic features (HCC) [F33.2] 11/15/2015   Total Time spent with patient: 20 minutes  Past Psychiatric History: see admission H and P  Past Medical History:  Past Medical History  Diagnosis Date  . Deaf     Past Surgical History  Procedure Laterality Date  . Neck surgery     Family History: History reviewed. No pertinent family history. Family Psychiatric  History: see Admission H and P Social History:  History  Alcohol Use  . 3.6 oz/week  . 6 Cans of beer per week     History  Drug Use No    Social History   Social History  . Marital Status: Divorced    Spouse Name: N/A  . Number of Children: N/A  . Years of Education: N/A   Social History Main Topics  . Smoking status: Current Every Day Smoker  . Smokeless tobacco: None  . Alcohol Use: 3.6 oz/week    6 Cans of beer per week  . Drug Use: No  . Sexual Activity: Not Asked   Other Topics Concern  . None   Social History Narrative   Additional Social History:    Pain Medications: Given Ibuprofen at ED Prescriptions: see PTA meds Over the Counter: denies History of alcohol / drug use?: Yes Longest period of sobriety (when/how long): pt reports being in rehab in 1999 Negative Consequences of Use: Legal, Personal relationships,  Financial Withdrawal Symptoms: Agitation, Seizures, Tremors, Diarrhea Name of Substance 1: Alcohol 1 - Age of First Use: conflicting reports "1999" to one staff, reports to Clinical research associate "2014" 1 - Amount (size/oz): 3 "tall boys" a day 1 - Frequency: daily 1 - Duration: ongoing 1 - Last Use / Amount: 11/13/15                  Sleep: improving  Appetite:  improving  Current Medications: Current Facility-Administered Medications  Medication Dose Route Frequency Provider Last Rate Last Dose  . acetaminophen (TYLENOL) tablet 650 mg  650 mg Oral Q6H PRN Rachael Fee, MD   650 mg at 11/18/15 2113  . alum & mag hydroxide-simeth (MAALOX/MYLANTA) 200-200-20 MG/5ML suspension 30 mL  30 mL Oral Q4H PRN Rachael Fee, MD      . citalopram (CELEXA) tablet 10 mg  10 mg Oral Daily Rachael Fee, MD   10 mg at 11/19/15 0834  . feeding supplement (ENSURE ENLIVE) (ENSURE ENLIVE) liquid 237 mL  237 mL Oral TID BM Rachael Fee, MD   237 mL at 11/18/15 2114  . hydrOXYzine (ATARAX/VISTARIL) tablet 25 mg  25 mg Oral Q6H PRN Oneta Rack, NP   25 mg at 11/18/15 1754  . ibuprofen (ADVIL,MOTRIN) tablet 600 mg  600 mg Oral Q4H PRN Rachael Fee, MD   600 mg at 11/19/15 [phone]  . loperamide (IMODIUM) capsule 2  mg  2 mg Oral PRN Rachael Fee, MD   2 mg at 11/15/15 1431  . magnesium hydroxide (MILK OF MAGNESIA) suspension 30 mL  30 mL Oral Daily PRN Rachael Fee, MD      . multivitamin with minerals tablet 1 tablet  1 tablet Oral Daily Rachael Fee, MD   1 tablet at 11/19/15 [phone]  . nicotine (NICODERM CQ - dosed in mg/24 hours) patch 21 mg  21 mg Transdermal Daily Rachael Fee, MD   21 mg at 11/19/15 0834  . thiamine (VITAMIN B-1) tablet 100 mg  100 mg Oral Daily Rachael Fee, MD   100 mg at 11/19/15 0834  . traZODone (DESYREL) tablet 50 mg  50 mg Oral QHS PRN Rachael Fee, MD   50 mg at 11/18/15 2201    Lab Results: No results found for this or any previous visit (from the past 48 hour(s)).  Blood Alcohol  level:  Lab Results  Component Value Date   [facility] 251* 11/14/2015    Physical Findings: AIMS: Facial and Oral Movements Muscles of Facial Expression: None, normal Lips and Perioral Area: None, normal Jaw: None, normal Tongue: None, normal,Extremity Movements Upper (arms, wrists, hands, fingers): None, normal Lower (legs, knees, ankles, toes): None, normal, Trunk Movements Neck, shoulders, hips: None, normal, Overall Severity Severity of abnormal movements (highest score from questions above): None, normal Incapacitation due to abnormal movements: None, normal Patient's awareness of abnormal movements (rate only patient's report): No Awareness, Dental Status Current problems with teeth and/or dentures?: No Does patient usually wear dentures?: No  CIWA:  CIWA-Ar Total: 8 COWS:  COWS Total Score: 0  Musculoskeletal: Strength & Muscle Tone: within normal limits Gait & Station: trauma to Left ankle  Patient leans: on wheel chair  Psychiatric Specialty Exam: Review of Systems  Constitutional: Positive for malaise/fatigue.  Musculoskeletal: Positive for joint pain.  Skin: Negative for rash.  Neurological: Positive for tremors. Negative for headaches.  Endo/Heme/Allergies: Negative.   Psychiatric/Behavioral: Positive for depression and substance abuse.    Blood pressure 132/77, pulse 57, temperature 97.7 F (36.5 C), temperature source Oral, resp. rate 18, height  (1.753 m), weight 81.647 kg (180 lb), SpO2 98 %.Body mass index is 26.57 kg/(m^2).  General Appearance: Disheveled  Eye Solicitor::  Fair  Speech:  Deaf  Volume:  volume of sounds fluctuates  Mood:  dyphoric but not hopeless  Affect; congruent  Thought Process:  Coherent and Goal Directed  Orientation:  Full (Time, Place, and Person)  Thought Content:  answers questions   Suicidal Thoughts:  No  Homicidal Thoughts:  No  Memory:  Immediate;   Fair Recent;   Fair Remote;   Fair  Judgement:  Fair  Insight:   Present and Shallow  Psychomotor Activity:  Restlessness  Concentration:  Fair  Recall:  [employer] of Knowledge:Fair  Language: by interpreter fair  Akathisia:  No  Handed:  Right  AIMS (if indicated):     Assets:  Desire for Improvement  ADL's:  Intact  Cognition: WNL  Sleep:  Number of Hours: 6.75   Treatment Plan Summary: Daily contact with patient to assess and evaluate symptoms and progress in treatment and Medication management Supportive approach/coping skills Alcohol dependence; continue the Ativan detox protocol/work a relapse prevention plan Ankle sprain ; continue to follow PT recommendations Depression; increase celexa   Work with CBT/mindfulnes Explore residential treatment options (GF is at [facility], a therapeutic shelter) Thresa Ross, MD  11/19/2015, 12:35 PM

## 2015-11-19 NOTE — Progress Notes (Signed)
1:1 Note  D) Pt has been out in the milieu sitting with his sitter and attending the groups. Affect and mood are appropriate. Pt denies SI and HI. Rates his depression at a 7, hopelessness at a 6 and his anxiety at a 7. States the pain in his ankle is decreasing and that makes him feel happy. A) Given support, reassurance and praise. Provided with a brief 1:1 with his interpreter.  D) Pt remains on his 1:1 and remains safe.

## 2015-11-19 NOTE — Plan of Care (Signed)
Problem: Alteration in mood & ability to function due to Goal: STG-Pt will be introduced to the 12-step program of recovery (Patient will be introduced to the 12-step program of recovery and disease concept of addiction)  Outcome: Progressing Patient attended AA meeting tonight on the unit.

## 2015-11-19 NOTE — Progress Notes (Signed)
1:1 Nursing note (late entry) Patient currently asleep with eyes closed and respirations even, no distress noted. Safety maintained and 1:1 continues.

## 2015-11-19 NOTE — Progress Notes (Signed)
Patient did attend the evening speaker AA meeting.  

## 2015-11-20 MED ORDER — HYDROXYZINE HCL 25 MG PO TABS
25.0000 mg | ORAL_TABLET | Freq: Four times a day (QID) | ORAL | Status: DC | PRN
  Administered 2015-11-27: 25 mg via ORAL
  Filled 2015-11-20 (×2): qty 1

## 2015-11-20 MED ORDER — TRAMADOL HCL 50 MG PO TABS
50.0000 mg | ORAL_TABLET | Freq: Four times a day (QID) | ORAL | Status: DC | PRN
  Administered 2015-11-20 – 2015-12-02 (×36): 50 mg via ORAL
  Filled 2015-11-20 (×36): qty 1

## 2015-11-20 MED ORDER — DIPHENHYDRAMINE HCL 25 MG PO CAPS
25.0000 mg | ORAL_CAPSULE | Freq: Once | ORAL | Status: AC
  Administered 2015-11-20: 25 mg via ORAL
  Filled 2015-11-20 (×2): qty 1

## 2015-11-20 MED ORDER — PANTOPRAZOLE SODIUM 40 MG PO TBEC
40.0000 mg | DELAYED_RELEASE_TABLET | Freq: Every day | ORAL | Status: DC
  Administered 2015-11-20: 40 mg via ORAL
  Filled 2015-11-20 (×3): qty 1

## 2015-11-20 NOTE — Progress Notes (Signed)
1:1 nursing note- Patient is lying in bed asleep with eyes closed and respirations even and unlabored, no distress noted. 1:1 continues and patient is safe.

## 2015-11-20 NOTE — Progress Notes (Signed)
1:1 Nursing note - Patient is currently lying in bed asleep with eyes closed and respirations even, no distress noted. 1:1 continues and patient is safe. 

## 2015-11-20 NOTE — Progress Notes (Signed)
1:1 Note - Pt is in the day room watching TV, pt took all his meds without problems, no falls or unwanted behavior reported. Pt denies SI/HI, 1:1 observation continues, safety maintained.

## 2015-11-20 NOTE — Progress Notes (Signed)
Pt attended evening AA group. Interpreter present. Sitter at side.

## 2015-11-20 NOTE — Progress Notes (Signed)
1:1 Note - Pt is attending groups at this time. Pt is calm, alert and oriented. Pt uses walker to get around, no falls observed or reported. 1:1 observation continues, pt remains safe, will continue to monitor.

## 2015-11-20 NOTE — Progress Notes (Signed)
1:1 Note - Pt is currently in the day room watching TV with peers. No falls observed, no complains at this. 1:1 observation continues, pt remains safe, will continue to monitor.

## 2015-11-20 NOTE — Progress Notes (Signed)
Received call from nursing staff that patient began to have redness and itching to his face. Recently received a first dose of Protonix today, which can cause in rare cases serious allergic reaction. Discontinue Protonix at this time. Patient may use prn maalox as needed for symptoms of indigestion. Ordered now dose of Benadryl 25 mg orally times one. Patient has standing order for vistaril as needed if symptoms persist after medication discontinuation.

## 2015-11-20 NOTE — Progress Notes (Signed)
1:1 progress note:  Pt has been in the dayroom all evening watching TV.  The interpreter came at 2000 for group time and writer was able to converse with patient who said he was still having pain in his L ankle.  He requested pain med and was given Motrin at that time.  Pt denies any withdrawal symptoms at this time.  He denies SI/HI/AVH.  He is using a walker to ambulate.  He continues on 1:1 for safety.  Support and encouragement offered.  Sitter with patient.  At 2200, pt was still sitting in the dayroom and had taken his night meds by 2200.  Pt is cooperative and safe at this time.

## 2015-11-20 NOTE — [facility] Group Notes (Signed)
[facility] LCSW Group Therapy 11/20/2015  1:15 pm  Type of Therapy: Group Therapy Participation Level: Active  Participation Quality: Attentive, Sharing and Supportive  Affect: Depressed and Flat  Cognitive: Alert and Oriented  Insight: Developing/Improving and Engaged  Engagement in Therapy: Developing/Improving and Engaged  Modes of Intervention: Clarification, Confrontation, Discussion, Education, Exploration,  Limit-setting, Orientation, Problem-solving, Rapport Building, Dance movement psychotherapist, Socialization and Support  Summary of Progress/Problems: Pt identified obstacles faced currently and processed barriers involved in overcoming these obstacles. Pt identified steps necessary for overcoming these obstacles and explored motivation (internal and external) for facing these difficulties head on. Pt further identified one area of concern in their lives and chose a goal to focus on for today. Patient identified recognizing positive supports and recovery as a slow process. CSW and other group members provided patient with emotional support and encouragement.  [name], LCSW Clinical Social Worker [facility] [phone]

## 2015-11-20 NOTE — Progress Notes (Signed)
[facility] MD Progress Note  11/20/2015 8:21 PM Lee [name]  MRN:  [id number] Subjective:  Still endorses ankle pain not completely help by the Motrin. He also has chest pain that he thinks had to do with his previous episodes of vomiting. He has been able to eat and keep his food down. States he really needs to go to a residential treatment program.  Principal Problem: Severe recurrent major depression without psychotic features (HCC) Diagnosis:   Patient Active Problem List   Diagnosis Date Noted  . Alcohol dependence (HCC) [F10.20] 11/15/2015  . Severe recurrent major depression without psychotic features (HCC) [F33.2] 11/15/2015   Total Time spent with patient: 15 minutes  Past Psychiatric History: see admission H and P  Past Medical History:  Past Medical History  Diagnosis Date  . Deaf     Past Surgical History  Procedure Laterality Date  . Neck surgery     Family History: History reviewed. No pertinent family history. Family Psychiatric  History: see admission H and P Social History:  History  Alcohol Use  . 3.6 oz/week  . 6 Cans of beer per week     History  Drug Use No    Social History   Social History  . Marital Status: Divorced    Spouse Name: N/A  . Number of Children: N/A  . Years of Education: N/A   Social History Main Topics  . Smoking status: Current Every Day Smoker  . Smokeless tobacco: None  . Alcohol Use: 3.6 oz/week    6 Cans of beer per week  . Drug Use: No  . Sexual Activity: Not Asked   Other Topics Concern  . None   Social History Narrative   Additional Social History:    Pain Medications: Given Ibuprofen at ED Prescriptions: see PTA meds Over the Counter: denies History of alcohol / drug use?: Yes Longest period of sobriety (when/how long): pt reports being in rehab in 1999 Negative Consequences of Use: Legal, Personal relationships, Financial Withdrawal Symptoms: Agitation, Seizures, Tremors, Diarrhea Name of Substance 1:  Alcohol 1 - Age of First Use: conflicting reports "1999" to one staff, reports to Clinical research associate "2014" 1 - Amount (size/oz): 3 "tall boys" a day 1 - Frequency: daily 1 - Duration: ongoing 1 - Last Use / Amount: 11/13/15                  Sleep: Fair  Appetite:  Fair  Current Medications: Current Facility-Administered Medications  Medication Dose Route Frequency Provider Last Rate Last Dose  . acetaminophen (TYLENOL) tablet 650 mg  650 mg Oral Q6H PRN Rachael Fee, MD   650 mg at 11/19/15 2139  . alum & mag hydroxide-simeth (MAALOX/MYLANTA) 200-200-20 MG/5ML suspension 30 mL  30 mL Oral Q4H PRN Rachael Fee, MD      . citalopram (CELEXA) tablet 20 mg  20 mg Oral Daily Thresa Ross, MD   20 mg at 11/20/15 0808  . diphenhydrAMINE (BENADRYL) capsule 25 mg  25 mg Oral Once Thermon Leyland, NP   25 mg at 11/20/15 2004  . feeding supplement (ENSURE ENLIVE) (ENSURE ENLIVE) liquid 237 mL  237 mL Oral TID BM Rachael Fee, MD   237 mL at 11/20/15 2100  . hydrOXYzine (ATARAX/VISTARIL) tablet 25 mg  25 mg Oral Q6H PRN Thermon Leyland, NP      . ibuprofen (ADVIL,MOTRIN) tablet 600 mg  600 mg Oral Q4H PRN Rachael Fee, MD   600 mg  at 11/20/15 2002  . loperamide (IMODIUM) capsule 2 mg  2 mg Oral PRN Rachael Fee, MD   2 mg at 11/15/15 1431  . magnesium hydroxide (MILK OF MAGNESIA) suspension 30 mL  30 mL Oral Daily PRN Rachael Fee, MD      . multivitamin with minerals tablet 1 tablet  1 tablet Oral Daily Rachael Fee, MD   1 tablet at 11/20/15 [phone]  . nicotine (NICODERM CQ - dosed in mg/24 hours) patch 21 mg  21 mg Transdermal Daily Rachael Fee, MD   21 mg at 11/20/15 0809  . thiamine (VITAMIN B-1) tablet 100 mg  100 mg Oral Daily Rachael Fee, MD   100 mg at 11/20/15 0808  . traMADol (ULTRAM) tablet 50 mg  50 mg Oral Q6H PRN Rachael Fee, MD   50 mg at 11/20/15 1701  . traZODone (DESYREL) tablet 50 mg  50 mg Oral QHS PRN Rachael Fee, MD   50 mg at 11/19/15 2140    Lab Results: No results  found for this or any previous visit (from the past 48 hour(s)).  Blood Alcohol level:  Lab Results  Component Value Date   [facility] 251* 11/14/2015    Physical Findings: AIMS: Facial and Oral Movements Muscles of Facial Expression: None, normal Lips and Perioral Area: None, normal Jaw: None, normal Tongue: None, normal,Extremity Movements Upper (arms, wrists, hands, fingers): None, normal Lower (legs, knees, ankles, toes): None, normal, Trunk Movements Neck, shoulders, hips: None, normal, Overall Severity Severity of abnormal movements (highest score from questions above): None, normal Incapacitation due to abnormal movements: None, normal Patient's awareness of abnormal movements (rate only patient's report): Aware, no distress, Dental Status Current problems with teeth and/or dentures?: No Does patient usually wear dentures?: No  CIWA:  CIWA-Ar Total: 3 COWS:  COWS Total Score: 0  Musculoskeletal: Strength & Muscle Tone: within normal limits Gait & Station: affected by the ankle sprain Patient leans: normal  Psychiatric Specialty Exam: Review of Systems  Constitutional: Positive for malaise/fatigue.  HENT: Negative.   Eyes: Negative.   Respiratory: Negative.   Cardiovascular: Positive for chest pain.  Gastrointestinal: Negative.   Genitourinary: Negative.   Musculoskeletal: Positive for joint pain.  Skin: Negative.   Neurological: Negative.   Endo/Heme/Allergies: Negative.   Psychiatric/Behavioral: Positive for substance abuse. The patient is nervous/anxious.     Blood pressure 142/68, pulse 56, temperature 97.7 F (36.5 C), temperature source Oral, resp. rate 18, height  (1.753 m), weight 81.647 kg (180 lb), SpO2 98 %.Body mass index is 26.57 kg/(m^2).  General Appearance: Fairly Groomed  Patent attorney::  Fair  Speech:  Deaf  Volume:  Normal  Mood:  Anxious and worried  Affect:  anxious worried  Thought Process:  Coherent and Goal Directed  Orientation:  Full  (Time, Place, and Person)  Thought Content:  answers questions symptoms events worries concerns  Suicidal Thoughts:  No  Homicidal Thoughts:  No  Memory:  Immediate;   Fair Recent;   Fair Remote;   Fair  Judgement:  Fair  Insight:  Present  Psychomotor Activity:  Restlessness  Concentration:  Fair  Recall:  [employer] of Knowledge:Fair  Language: Fair  Akathisia:  No  Handed:  Right  AIMS (if indicated):     Assets:  Desire for Improvement  ADL's:  Intact  Cognition: WNL  Sleep:  Number of Hours: 4.75   Treatment Plan Summary: Daily contact with patient to assess and  evaluate symptoms and progress in treatment and Medication management Supportive approach/coping skills Alcohol dependence;continue to work a relapse prevention plan Depression; continue to work with the Celexa 20 mg consider increasing to 30 Pain; R/O reflux will start Protonix Pain; will add Ultram 50 mg with the Motrin alternating Use CBT/mindfulness Continue to explore residential treatment options Ezekiah Massie A, MD 11/20/2015, 8:21 PM

## 2015-11-20 NOTE — Clinical Social Work Note (Signed)
No [facility] beds available today per [facility].  [name], LCSW Clinical Social Worker [facility] [phone]

## 2015-11-20 NOTE — Progress Notes (Addendum)
Nursing note- (late entry for 2200) - Patient is sitting in the dayroom watching tv after he received his hs medication. He attended group tonight and reported through his interpreter that he enjoyed it. He c/o ankle pain and received medication and ice packs. He denies si/hi/a/v hallucinations. 1:1 continues and patient remains safe.

## 2015-11-20 NOTE — Progress Notes (Signed)
Pt complained of redness on his face and burning sensations. Pt thought he was reacting to protonix, pt was offered benadryl, but declined stating he was starting to feel better and thought it was just in his mind.

## 2015-11-20 NOTE — [facility] Group Notes (Signed)
   [facility] LCSW Aftercare Discharge Planning Group Note  11/20/2015  8:45 AM   Participation Quality: Alert, Appropriate and Oriented  Mood/Affect: Depressed and Flat   Depression Rating: 8  Anxiety Rating: 6-7  Thoughts of Suicide: Pt denies SI/HI  Will you contract for safety? Yes  Current AVH: Pt denies  Plan for Discharge/Comments: Pt attended discharge planning group and actively participated in group. CSW provided pt with today's workbook. Patient continues to be interested in residential treatment at discharge. Patient continues to endorse high levels of depression.   Transportation Means: Pt denies access to transportation  Supports: No supports mentioned at this time  [name], MSW, [employer] Clinical Social Worker [employer] [phone]

## 2015-11-20 NOTE — Progress Notes (Signed)
Physical Therapy Treatment Patient Details Name: Lee [name] MRN: [id number] DOB: [birth year] Today's Date: 11/20/2015    History of Present Illness Pt 60 Y/O deaf gentleman who admits he is drinking too much. He is seeking help to stop. He stated he feel about 6 weeks ago and hurt his ankle and then fell here 2 days ago and hurt his Left ankle again. He also has difficulty walking at this time due to his ank;le and also reports feeling dixxy at times recently when he stands or tries to walk.     PT Comments      Pt up and walking using RW with 1:1. He states and as observed and stated by his 1:1 , he does fine with RW until he seems to get further with distance and then he starts to favor the L leg a little due to pain. Pt states it is still hurting. I reviewed recommendations to still eelvate and ice it at times through the day. It is likely hurting a little more because he is on his feet a little more now and using it. Pt reports the brace is helping and making it feel a little better and more steady. I reviewed to take the brace off periodically during the day to rest without brace and ice. And Do not wear the brace at night. The brace at this time seemed to be a little tight and/or he had increased swelling for he had been up on his feet and walking and in dependent position while sitting in group. Pt much more steady on feet and progressing as needs to be with this ankle sprain. No further PT needs at this time, will sign off.    Follow Up Recommendations   No further PT needed at this time.      Equipment Recommendations    May need RW if he doesn't progress off of it prior to DC.      Time: 1410-1420 PT Time Calculation (min) (ACUTE ONLY): 10 min  Charges:  $Therapeutic Activity: 8-22 mins                    G Codes:  Functional Assessment Tool Used: clinical judgement Functional Limitation: Mobility: Walking and moving around Mobility: Walking and Moving Around Current Status  (J1914): At least 1 percent but less than 20 percent impaired, limited or restricted Mobility: Walking and Moving Around Goal Status [phone]): At least 1 percent but less than 20 percent impaired, limited or restricted Mobility: Walking and Moving Around Discharge Status [phone]): At least 1 percent but less than 20 percent impaired, limited or restricted   Marella Bile 11/20/2015, 3:38 PM Marella Bile, PT Pager: [phone] 11/20/2015

## 2015-11-20 NOTE — Progress Notes (Signed)
1:1 Nursing note - Patient is currently lying in bed with eyes closed and respirations even and unlabored, no distress noted. 1:1 continues and patient is safe.

## 2015-11-21 DIAGNOSIS — F1020 Alcohol dependence, uncomplicated: Secondary | ICD-10-CM | POA: Insufficient documentation

## 2015-11-21 MED ORDER — TRAZODONE HCL 100 MG PO TABS
100.0000 mg | ORAL_TABLET | Freq: Every evening | ORAL | Status: DC | PRN
  Administered 2015-11-21 – 2015-12-04 (×14): 100 mg via ORAL
  Filled 2015-11-21 (×13): qty 1

## 2015-11-21 MED ORDER — CITALOPRAM HYDROBROMIDE 20 MG PO TABS
30.0000 mg | ORAL_TABLET | Freq: Every day | ORAL | Status: DC
  Administered 2015-11-22 – 2015-11-23 (×2): 30 mg via ORAL
  Filled 2015-11-21 (×4): qty 1

## 2015-11-21 NOTE — Plan of Care (Signed)
Problem: Alteration in mood & ability to function due to Goal: STG-Patient will comply with prescribed medication regimen (Patient will comply with prescribed medication regimen)  Outcome: Progressing Patient med compliant.   Problem: Alteration in mood Goal: STG-Patient reports thoughts of self-harm to staff Outcome: Progressing Patient denies SI, thoughts to self harm.

## 2015-11-21 NOTE — Progress Notes (Signed)
[facility] Post 1:1 Observation Documentation  For the first (8) hours following discontinuation of 1:1 precautions, a progress note entry by nursing staff should be documented at least every 2 hours, reflecting the patient's behavior, condition, mood, and conversation.  Use the progress notes for additional entries.  Time 1:1 discontinued:  1600  Patient's Behavior:  Pt is calm, sitting in the dayroom, watching the TV.  No interaction with peers  Patient's Condition:  Pt is stable.  Patient's Conversation:  The hearing impaired interpreter has left, so communication with pt is by notes.  He says he is fine and that his ankle pain is tolerable.  He asked for and received his sleep aid at this time.  Pt was reminded that if he awoke in the middle of the night and needed to go to the bathroom, but felt dizzy after standing, to sit back down and wait for the MHT to come assist him.    Jesus Genera [facility] 11/21/2015, 10:04 PM

## 2015-11-21 NOTE — [facility] Group Notes (Signed)
[facility] LCSW Group Therapy 11/21/2015 1:15 PM Type of Therapy: Group Therapy Participation Level: Active  Participation Quality: Attentive, Sharing and Supportive  Affect: Appropriate  Cognitive: Alert and Oriented  Insight: Developing/Improving and Engaged  Engagement in Therapy: Developing/Improving and Engaged  Modes of Intervention: Activity, Clarification, Confrontation, Discussion, Education, Exploration, Limit-setting, Orientation, Problem-solving, Rapport Building, Reality Testing, Socialization and Support  Summary of Progress/Problems: Patient was attentive and engaged with speaker from [employer]. Patient was attentive to speaker while they shared their story of dealing with mental health and overcoming it. Patient expressed interest in their programs and services and received information on their agency. Patient processed ways they can relate to the speaker.   Naudia Crosley, LCSW Clinical Social Worker [facility] [phone]   

## 2015-11-21 NOTE — Progress Notes (Signed)
Patient sitting in dayroom with MHT and interpreter, communicating often. States he is anxious to see MD as he was concerned about his EKG results from yesterday. Complaining of L ankle pain of an 8/10. Rating his depression and anxiety both at an 8/10, hopelessness at a 7/10. Patient pleasant and cooperative. Medicated per orders, tramadol prn given for pain. Emotional support offered. Self inventory reviewed. Patient ambulating with staff assist, walker. 1:1 in place for fall risk. Denies SI/HI and remains safe. Lee [name]

## 2015-11-21 NOTE — Progress Notes (Signed)
[facility] MD Progress Note  11/21/2015 7:23 PM Lee [name]  MRN:  [id number] Subjective:  Lee [name] states he is getting better. He is anxious to hear about [facility], no word so far. He is not having any more diarrhea or nausea or vomiting. He is eating better. Still with the chest discomfort. Principal Problem: Severe recurrent major depression without psychotic features (HCC) Diagnosis:   Patient Active Problem List   Diagnosis Date Noted  . Alcohol dependence (HCC) [F10.20] 11/15/2015  . Severe recurrent major depression without psychotic features (HCC) [F33.2] 11/15/2015   Total Time spent with patient: 20 minutes  Past Psychiatric History: see admission H and P  Past Medical History:  Past Medical History  Diagnosis Date  . Deaf     Past Surgical History  Procedure Laterality Date  . Neck surgery     Family History: History reviewed. No pertinent family history. Family Psychiatric  History: see admission H and P Social History:  History  Alcohol Use  . 3.6 oz/week  . 6 Cans of beer per week     History  Drug Use No    Social History   Social History  . Marital Status: Divorced    Spouse Name: N/A  . Number of Children: N/A  . Years of Education: N/A   Social History Main Topics  . Smoking status: Current Every Day Smoker  . Smokeless tobacco: None  . Alcohol Use: 3.6 oz/week    6 Cans of beer per week  . Drug Use: No  . Sexual Activity: Not Asked   Other Topics Concern  . None   Social History Narrative   Additional Social History:    Pain Medications: Given Ibuprofen at ED Prescriptions: see PTA meds Over the Counter: denies History of alcohol / drug use?: Yes Longest period of sobriety (when/how long): pt reports being in rehab in 1999 Negative Consequences of Use: Legal, Personal relationships, Financial Withdrawal Symptoms: Agitation, Seizures, Tremors, Diarrhea Name of Substance 1: Alcohol 1 - Age of First Use: conflicting reports "1999" to one  staff, reports to Clinical research associate "2014" 1 - Amount (size/oz): 3 "tall boys" a day 1 - Frequency: daily 1 - Duration: ongoing 1 - Last Use / Amount: 11/13/15                  Sleep: Fair  Appetite:  Fair  Current Medications: Current Facility-Administered Medications  Medication Dose Route Frequency Provider Last Rate Last Dose  . acetaminophen (TYLENOL) tablet 650 mg  650 mg Oral Q6H PRN Rachael Fee, MD   650 mg at 11/19/15 2139  . alum & mag hydroxide-simeth (MAALOX/MYLANTA) 200-200-20 MG/5ML suspension 30 mL  30 mL Oral Q4H PRN Rachael Fee, MD      . citalopram (CELEXA) tablet 20 mg  20 mg Oral Daily Thresa Ross, MD   20 mg at 11/21/15 0810  . feeding supplement (ENSURE ENLIVE) (ENSURE ENLIVE) liquid 237 mL  237 mL Oral TID BM Rachael Fee, MD   237 mL at 11/21/15 1424  . hydrOXYzine (ATARAX/VISTARIL) tablet 25 mg  25 mg Oral Q6H PRN Thermon Leyland, NP      . ibuprofen (ADVIL,MOTRIN) tablet 600 mg  600 mg Oral Q4H PRN Rachael Fee, MD   600 mg at 11/20/15 2002  . loperamide (IMODIUM) capsule 2 mg  2 mg Oral PRN Rachael Fee, MD   2 mg at 11/15/15 1431  . magnesium hydroxide (MILK OF MAGNESIA) suspension 30 mL  30 mL Oral Daily PRN Heleena Miceli A Kia Varnadore, MD      . multivitamin with minerals tablet 1 tablet  1 tablet Oral Daily Rachael Fee, MD   1 tablet at 11/21/15 0809  . nicotine (NICODERM CQ - dosed in mg/24 hours) patch 21 mg  21 mg Transdermal Daily Rachael Fee, MD   21 mg at 11/21/15 [phone]  . thiamine (VITAMIN B-1) tablet 100 mg  100 mg Oral Daily Rachael Fee, MD   100 mg at 11/21/15 0809  . traMADol (ULTRAM) tablet 50 mg  50 mg Oral Q6H PRN Rachael Fee, MD   50 mg at 11/21/15 1424  . traZODone (DESYREL) tablet 100 mg  100 mg Oral QHS PRN Rachael Fee, MD        Lab Results: No results found for this or any previous visit (from the past 48 hour(s)).  Blood Alcohol level:  Lab Results  Component Value Date   [facility] 251* 11/14/2015    Physical Findings: AIMS: Facial and  Oral Movements Muscles of Facial Expression: None, normal Lips and Perioral Area: None, normal Jaw: None, normal Tongue: None, normal,Extremity Movements Upper (arms, wrists, hands, fingers): None, normal Lower (legs, knees, ankles, toes): None, normal, Trunk Movements Neck, shoulders, hips: None, normal, Overall Severity Severity of abnormal movements (highest score from questions above): None, normal Incapacitation due to abnormal movements: None, normal Patient's awareness of abnormal movements (rate only patient's report): Aware, no distress, Dental Status Current problems with teeth and/or dentures?: No Does patient usually wear dentures?: No  CIWA:  CIWA-Ar Total: 1 COWS:  COWS Total Score: 0  Musculoskeletal: Strength & Muscle Tone: within normal limits Gait & Station: using the walker Patient leans: normal  Psychiatric Specialty Exam: Review of Systems  Constitutional: Negative.   HENT: Negative.   Eyes: Negative.   Respiratory: Negative.   Cardiovascular: Negative.   Gastrointestinal: Positive for heartburn.  Genitourinary: Negative.   Musculoskeletal: Positive for joint pain.  Skin: Negative.   Neurological: Negative.   Endo/Heme/Allergies: Negative.   Psychiatric/Behavioral: Positive for substance abuse. The patient is nervous/anxious.     Blood pressure 120/78, pulse 54, temperature 97.7 F (36.5 C), temperature source Oral, resp. rate 16, height  (1.753 m), weight 81.647 kg (180 lb), SpO2 98 %.Body mass index is 26.57 kg/(m^2).  General Appearance: Fairly Groomed  Patent attorney::  Fair  Speech:  deft  Volume:  deft  Mood:  Anxious and worried  Affect:  anxious worried  Thought Process:  Coherent and Goal Directed  Orientation:  Full (Time, Place, and Person)  Thought Content:  symptoms events worries concerns  Suicidal Thoughts:  No  Homicidal Thoughts:  No  Memory:  Immediate;   Fair Recent;   Fair Remote;   Fair  Judgement:  Fair  Insight:   Present  Psychomotor Activity:  Restlessness  Concentration:  Fair  Recall:  [employer] of Knowledge:Fair  Language: Fair  Akathisia:  No  Handed:  Right  AIMS (if indicated):     Assets:  Desire for Improvement  ADL's:  Intact  Cognition: WNL  Sleep:  Number of Hours: 4.25   Treatment Plan Summary: Daily contact with patient to assess and evaluate symptoms and progress in treatment Supportive approach/coping skills Depression-anxiety; continue the Celexa increase to 30 mg Alcohol dependence; work a relapse prevention plan Ankle pain; continue the Ultram ( has found it more effective) Chest discomfort; R/O GI Rachael Feentinue to explore residential treatment  options Vimal Derego A, MD 11/21/2015, 7:23 PM

## 2015-11-21 NOTE — Progress Notes (Signed)
1:1 progress note:  Sat with pt from 2-240 for sitter's break.  Pt was asleep most of the time, but did get up about 2:35 to use the bathroom.  Pt had no needs at that time.  Pt continues on 1:1 for safety.  Pt safe at this time.

## 2015-11-21 NOTE — Progress Notes (Signed)
[facility] Post 1:1 Observation Documentation  For the first (8) hours following discontinuation of 1:1 precautions, a progress note entry by nursing staff should be documented at least every 2 hours, reflecting the patient's behavior, condition, mood, and conversation.  Use the progress notes for additional entries.  Time 1:1 discontinued:  1600  Patient's Behavior:  Pt is sitting in the dayroom watching TV and talking with the sign interpreter.    Patient's Condition:  Pt reports he is doing fine.  He is stable.  His affect is brighter than yesterday.  His gait is more steady, but he is still using the rolling walker for ambulation.    Patient's Conversation:  Through the interpreter, pt reports a good day.  His only complaint is his L ankle pain for which he is receiving Tramadol prn.  Jesus Genera [facility] 11/21/2015, 8:15 PM

## 2015-11-21 NOTE — Progress Notes (Signed)
[facility] Post 1:1 Observation Documentation  For the first (8) hours following discontinuation of 1:1 precautions, a progress note entry by nursing staff should be documented at least every 2 hours, reflecting the patient's behavior, condition, mood, and conversation.  Use the progress notes for additional entries.  Time 1:1 discontinued:  1600  Patient's Behavior:  Calm, cooperative. Communicating with sign language interpreter. Steady on feet.   Patient's Condition:  Stable.  Patient's Conversation:  Confirms understanding of discontinuation of level I obs.  [name] 11/21/2015, 4:03 PM

## 2015-11-21 NOTE — Progress Notes (Signed)
Pt is resting in bed with eyes closed.  No distress observed.  Respirations even/unlabored.  Continue 1:1 for safety.  Sitter at bedside.  Pt remains safe.   

## 2015-11-21 NOTE — Progress Notes (Signed)
Adult Psychoeducational Group Note  Date:  11/21/2015 Time:  8:20 PM  Group Topic/Focus:  Wrap-Up Group:   The focus of this group is to help patients review their daily goal of treatment and discuss progress on daily workbooks.  Participation Level:  Active  Participation Quality:  Appropriate and Attentive  Affect:  Appropriate  Cognitive:  Appropriate  Insight: Appropriate  Engagement in Group:  Engaged  Modes of Intervention:  Discussion  Additional Comments:  Pt was pleasant during wrap-up group. Pt rated his overall day a 9 out of 10 because he can feel that his withdrawal symptoms getting better. Pt reported that he achieved his goal for the day, which was to call [facility], however what he needed to get done did not get taken care of, so he will try again tomorrow.   Cleotilde Neer 11/21/2015, 9:24 PM

## 2015-11-21 NOTE — Progress Notes (Signed)
Patient currently in group with SW. MHT, interpreter present. Asking for pain meds prior to group and patient made aware 1410 would be the earliest he could have the tramadol. 1:1 remains in place for fall risk, safety. Patient remains safe. [name]

## 2015-11-21 NOTE — Progress Notes (Signed)
[facility] Post 1:1 Observation Documentation  For the first (8) hours following discontinuation of 1:1 precautions, a progress note entry by nursing staff should be documented at least every 2 hours, reflecting the patient's behavior, condition, mood, and conversation.  Use the progress notes for additional entries.  Time 1:1 discontinued:  1600  Patient's Behavior:  Calm, cooperative.  Patient's Condition:  Stable.   Patient's Conversation:  No complaints.   [name] 11/21/2015, 6:12 PM

## 2015-11-21 NOTE — Tx Team (Signed)
Interdisciplinary Treatment Plan Update (Adult) Date: 11/21/2015    Time Reviewed: 9:30 AM  Progress in Treatment: Attending groups: Yes Participating in groups: Yes Taking medication as prescribed: Yes Tolerating medication: Yes Family/Significant other contact made: Yes, CSW has spoken with girlfriend Patient understands diagnosis: Yes Discussing patient identified problems/goals with staff: Yes Medical problems stabilized or resolved: Yes Denies suicidal/homicidal ideation: Yes Issues/concerns per patient self-inventory: Yes Other:  New problem(s) identified: N/A  Discharge Plan or Barriers: Patient is interested in residential treatment at discharge.   Reason for Continuation of Hospitalization:  Depression Anxiety Medication Stabilization   Comments: N/A  Estimated length of stay: 3-5 days    Patient is a 60 year old male with a diagnosis of substance induced mood disorder, Major Depressive Disorder, and Alcohol Use Disorder. Pt presented to the hospital with suicidal ideations and alcohol abuse. Pt reports primary trigger(s) for admission were housing and relationship stressors, as well as experiencing grief. Patient will benefit from crisis stabilization, medication evaluation, group therapy and psycho education in addition to case management for discharge planning. At discharge, it is recommended that Pt remain compliant with established discharge plan and continued treatment.   Review of initial/current patient goals per problem list:  1. Goal(s): Patient will participate in aftercare plan   Met: No   Target date: 3-5 days post admission date   As evidenced by: Patient will participate within aftercare plan AEB aftercare provider and housing plan at discharge being identified.  2/16: Goal progressing. Patient interested in residential treatment.  2/21: [facility] referral pending.    2. Goal (s): Patient will exhibit decreased depressive symptoms and suicidal  ideations.   Met: Goal progressing   Target date: 3-5 days post admission date   As evidenced by: Patient will utilize self rating of depression at 3 or below and demonstrate decreased signs of depression or be deemed stable for discharge by MD.   2/16: Goal progressing. Patient continues to endorse depression but is attending groups.  2/20: Patient rates depression at 8, denies SI.    4. Goal(s): Patient will demonstrate decreased signs of withdrawal due to substance abuse   Met: Yes   Target date: 3-5 days post admission date   As evidenced by: Patient will produce a CIWA/COWS score of 0, have stable vitals signs, and no symptoms of withdrawal  2/16: Goal not met: Pt continues to have withdrawal symptoms of headache, anxiety, and tremor and a CIWA score of a 3.  Pt to show decrease withdrawal symptoms prior to d/c.  2/21: Goal met. No withdrawal symptoms reported at this time per medical chart.      Attendees: Patient:    Family:    Physician: Dr. Parke Poisson; Dr. Sabra Heck 11/21/2015 9:30 AM  Nursing: Mayra Neer, Loletta Specter, [address], [place], [place] 11/21/2015 9:30 AM  Clinical Social Worker: Tilden Fossa, LCSW 11/21/2015 9:30 AM  Other: Peri Maris, LCSWA; [place], LCSW  11/21/2015 9:30 AM  Other:  11/21/2015 9:30 AM  Other: Lars Pinks, Case Manager 11/21/2015 9:30 AM  Other: Agustina Caroli, NP 11/21/2015 9:30 AM  Other:    Other:       Scribe for Treatment Team:  Tilden Fossa, [place]

## 2015-11-21 NOTE — Progress Notes (Signed)
Recreation Therapy Notes  Animal-Assisted Activity (AAA) Program Checklist/Progress Notes Patient Eligibility Criteria Checklist & Daily Group note for Rec Tx Intervention  Date: 02.21.2017 Time: 2:45pm Location: [address]   AAA/T Program Assumption of Risk Form signed by Patient/ or Parent Legal Guardian yes  Patient is free of allergies or sever asthma yes  Patient reports no fear of animals yes  Patient reports no history of cruelty to animals yes  Patient understands his/her participation is voluntary yes  Patient washes hands before animal contact yes  Patient washes hands after animal contact yes  Behavioral Response: Appropriate, Engaged   Education: Charity fundraiser, Appropriate Animal Interaction   Education Outcome: Acknowledges education.   Clinical Observations/Feedback: Patient appropriately engaged with therapy dog and peers in session. Patient pet therapy dog appropriately from floor level and asked appropriate questions about therapy dog and his training.   Lee [name], LRT/CTRS  Lee [name] 11/21/2015 3:08 PM

## 2015-11-22 NOTE — Progress Notes (Signed)
Recreation Therapy Notes  Date: 02.22.2017 Time: 9:30am Location: [address]   Group Topic: Stress Management  Goal Area(s) Addresses:  Patient will actively participate in stress management techniques presented during session.   Behavioral Response: Appropriate, Engaged.   Intervention: Stress management techniques  Activity :  Diaphragmatic Breathing and Mindful Breathing. LRT provided instruction and demonstration on practice of Diaphragmatic Breathing and Mindful Breathing.  Education:  Stress Management, Discharge Planning.   Education Outcome: Acknowledges education  Clinical Observations/Feedback: Patient actively engaged in technique introduced, expressed no concerns and demonstrated ability to practice independently post d/c.   Marykay Lex Tracie Lindbloom, LRT/CTRS        Okie Bogacz L 11/22/2015 10:17 AM

## 2015-11-22 NOTE — Progress Notes (Signed)
D: Patient denies SI, HI or AVH through written communication.  Pt. Patient expressed that he slept well and appetite is fair. Pt. Is up in the day room with interpreter.  A: Patient given emotional support from RN. Patient encouraged to come to staff with concerns and/or questions. Patient's medication routine continued. Patient's orders and plan of care reviewed.   R: Patient remains appropriate and cooperative. Will continue to monitor patient q15 minutes for safety.

## 2015-11-22 NOTE — Progress Notes (Signed)
[facility] MD Progress Note  11/22/2015 5:57 PM Lee [name]  MRN:  [id number] Subjective:  Lee [name] states he is slowly getting better. His ankle pain is better, the swelling is coming down, he is having less of the chest discomfort, less tremors no more diarrhea or nausea. Still wants to go to a residential treatment program as afraid of relapsing Principal Problem: Severe recurrent major depression without psychotic features [facility]) Diagnosis:   Patient Active Problem List   Diagnosis Date Noted  . Alcohol dependence with uncomplicated withdrawal (HCC) [F10.230]   . Alcohol dependence (HCC) [F10.20] 11/15/2015  . Severe recurrent major depression without psychotic features (HCC) [F33.2] 11/15/2015   Total Time spent with patient: 20 minutes  Past Psychiatric History: see admission H and p  Past Medical History:  Past Medical History  Diagnosis Date  . Deaf     Past Surgical History  Procedure Laterality Date  . Neck surgery     Family History: History reviewed. No pertinent family history. Family Psychiatric  History: see Admission H and P Social History:  History  Alcohol Use  . 3.6 oz/week  . 6 Cans of beer per week     History  Drug Use No    Social History   Social History  . Marital Status: Divorced    Spouse Name: N/A  . Number of Children: N/A  . Years of Education: N/A   Social History Main Topics  . Smoking status: Current Every Day Smoker  . Smokeless tobacco: None  . Alcohol Use: 3.6 oz/week    6 Cans of beer per week  . Drug Use: No  . Sexual Activity: Not Asked   Other Topics Concern  . None   Social History Narrative   Additional Social History:    Pain Medications: Given Ibuprofen at ED Prescriptions: see PTA meds Over the Counter: denies History of alcohol / drug use?: Yes Longest period of sobriety (when/how long): pt reports being in rehab in 1999 Negative Consequences of Use: Legal, Personal relationships, Financial Withdrawal Symptoms:  Agitation, Seizures, Tremors, Diarrhea Name of Substance 1: Alcohol 1 - Age of First Use: conflicting reports "1999" to one staff, reports to Clinical research associate "2014" 1 - Amount (size/oz): 3 "tall boys" a day 1 - Frequency: daily 1 - Duration: ongoing 1 - Last Use / Amount: 11/13/15                  Sleep: Fair  Appetite:  Fair  Current Medications: Current Facility-Administered Medications  Medication Dose Route Frequency Provider Last Rate Last Dose  . acetaminophen (TYLENOL) tablet 650 mg  650 mg Oral Q6H PRN Rachael Fee, MD   650 mg at 11/19/15 2139  . alum & mag hydroxide-simeth (MAALOX/MYLANTA) 200-200-20 MG/5ML suspension 30 mL  30 mL Oral Q4H PRN Rachael Fee, MD      . citalopram (CELEXA) tablet 30 mg  30 mg Oral Daily Rachael Fee, MD   30 mg at 11/22/15 0756  . feeding supplement (ENSURE ENLIVE) (ENSURE ENLIVE) liquid 237 mL  237 mL Oral TID BM Rachael Fee, MD   237 mL at 11/22/15 1425  . hydrOXYzine (ATARAX/VISTARIL) tablet 25 mg  25 mg Oral Q6H PRN Thermon Leyland, NP      . ibuprofen (ADVIL,MOTRIN) tablet 600 mg  600 mg Oral Q4H PRN Rachael Fee, MD   600 mg at 11/20/15 2002  . loperamide (IMODIUM) capsule 2 mg  2 mg Oral PRN Reymundo Poll  Dub Mikes, MD   2 mg at 11/15/15 1431  . magnesium hydroxide (MILK OF MAGNESIA) suspension 30 mL  30 mL Oral Daily PRN Rachael Fee, MD      . multivitamin with minerals tablet 1 tablet  1 tablet Oral Daily Rachael Fee, MD   1 tablet at 11/22/15 0756  . nicotine (NICODERM CQ - dosed in mg/24 hours) patch 21 mg  21 mg Transdermal Daily Rachael Fee, MD   21 mg at 11/22/15 0756  . thiamine (VITAMIN B-1) tablet 100 mg  100 mg Oral Daily Rachael Fee, MD   100 mg at 11/22/15 0756  . traMADol (ULTRAM) tablet 50 mg  50 mg Oral Q6H PRN Rachael Fee, MD   50 mg at 11/22/15 1426  . traZODone (DESYREL) tablet 100 mg  100 mg Oral QHS PRN Rachael Fee, MD   100 mg at 11/21/15 2156    Lab Results: No results found for this or any previous visit (from  the past 48 hour(s)).  Blood Alcohol level:  Lab Results  Component Value Date   [facility] 251* 11/14/2015    Physical Findings: AIMS: Facial and Oral Movements Muscles of Facial Expression: None, normal Lips and Perioral Area: None, normal Jaw: None, normal Tongue: None, normal,Extremity Movements Upper (arms, wrists, hands, fingers): None, normal Lower (legs, knees, ankles, toes): None, normal, Trunk Movements Neck, shoulders, hips: None, normal, Overall Severity Severity of abnormal movements (highest score from questions above): None, normal Incapacitation due to abnormal movements: None, normal Patient's awareness of abnormal movements (rate only patient's report): Aware, no distress, Dental Status Current problems with teeth and/or dentures?: No Does patient usually wear dentures?: No  CIWA:  CIWA-Ar Total: 1 COWS:  COWS Total Score: 0  Musculoskeletal: Strength & Muscle Tone: within normal limits Gait & Station: normal Patient leans: normal  Psychiatric Specialty Exam: Review of Systems  Constitutional: Negative.   HENT: Negative.   Eyes: Negative.   Respiratory: Negative.   Cardiovascular: Negative.   Gastrointestinal: Negative.   Genitourinary: Negative.   Musculoskeletal: Positive for joint pain.  Skin: Negative.   Neurological: Negative.   Endo/Heme/Allergies: Negative.   Psychiatric/Behavioral: Positive for substance abuse. The patient is nervous/anxious.     Blood pressure 137/67, pulse 55, temperature 98.3 F (36.8 C), temperature source Oral, resp. rate 16, height  (1.753 m), weight 81.647 kg (180 lb), SpO2 98 %.Body mass index is 26.57 kg/(m^2).  General Appearance: Fairly Groomed  Patent attorney::  Fair  Speech:  deaf  Volume:  deaf  Mood:  Anxious and worried  Affect:  anxious worried  Thought Process:  Coherent and Goal Directed  Orientation:  Full (Time, Place, and Person)  Thought Content:  Symptoms events worries concerns  Suicidal Thoughts:   no  Homicidal Thoughts:  No  Memory:  Immediate;   Fair Recent;   Fair Remote;   Fair  Judgement:  Fair  Insight:  Present  Psychomotor Activity:  Restlessness  Concentration:  Fair  Recall:  [employer] of Knowledge:Fair  Language: Fair  Akathisia:  No  Handed:  Right  AIMS (if indicated):     Assets:  Desire for Improvement  ADL's:  Intact  Cognition: WNL  Sleep:  Number of Hours: 6.25   Treatment Plan Summary: Daily contact with patient to assess and evaluate symptoms and progress in treatment and Medication management Supportive approach/coping skills Alcohol dependence; continue to work a relapse prevention plan Depression; continue the Celexa at  30 mg consider further increase to 40 mg Pain; continue the Ultram 50 mg PRN continue to explore residential treatment options Work with CBT/mindfulness  Rachael Fee, MD 11/22/2015, 5:57 PM

## 2015-11-22 NOTE — Progress Notes (Signed)
Pt attended evening NA group. Interpreter present.

## 2015-11-22 NOTE — Progress Notes (Signed)
Pt has been in the dayroom most of the evening.  Through the interpreter, he reports that his day has been better and he feels stronger, although he is still having constant pain in his L ankle.  He is still using the rolling walker to ambulate. He was taken off the 1:1 at 1600 today.  He denies SI/HI/AVH.  He is waiting to hear about bed availability at [facility].  Writer is able to communicate with pt by writing notes with the interpreter is not here.  Support and encouragement offered.  At bedtime, pt was reminded that he was receiving an increased dose of Trazodone tonight, and if he woke up to go to the bathroom, and felt dizzy upon standing, to sit back down and wait for the MHT to assist him.  Pt gave a thumbs up for understanding.  Safety maintained with q15 minute checks.

## 2015-11-22 NOTE — Progress Notes (Signed)
[facility] Post 1:1 Observation Documentation  For the first (8) hours following discontinuation of 1:1 precautions, a progress note entry by nursing staff should be documented at least every 2 hours, reflecting the patient's behavior, condition, mood, and conversation.  Use the progress notes for additional entries.  Time 1:1 discontinued:  1600  Patient's Behavior:  Pt is sleeping  Patient's Condition:  Pt is stable.  No distress observed.  Patient's Conversation:  None; pt is sleeping.  [facility] 11/22/2015, 12:17 AM

## 2015-11-23 MED ORDER — CITALOPRAM HYDROBROMIDE 40 MG PO TABS
40.0000 mg | ORAL_TABLET | Freq: Every day | ORAL | Status: DC
  Administered 2015-11-24 – 2015-12-05 (×12): 40 mg via ORAL
  Filled 2015-11-23 (×5): qty 1
  Filled 2015-11-23: qty 2
  Filled 2015-11-23 (×8): qty 1

## 2015-11-23 NOTE — Progress Notes (Signed)
[facility] MD Progress Note  11/23/2015 5:20 PM Lee [name]  MRN:  [id number] Subjective:  Worried about if he is going to be able to make it if he does not get a bed at [facility]. States that he is afraid, concerned. His GF was here to visit (she is also deaf) and she is concerned too. Principal Problem: Severe recurrent major depression without psychotic features (HCC) Diagnosis:   Patient Active Problem List   Diagnosis Date Noted  . Alcohol dependence with uncomplicated withdrawal (HCC) [F10.230]   . Alcohol dependence (HCC) [F10.20] 11/15/2015  . Severe recurrent major depression without psychotic features (HCC) [F33.2] 11/15/2015   Total Time spent with patient: 15 minutes  Past Psychiatric History: see admission H and P  Past Medical History:  Past Medical History  Diagnosis Date  . Deaf     Past Surgical History  Procedure Laterality Date  . Neck surgery     Family History: History reviewed. No pertinent family history. Family Psychiatric  History: see admission H and P Social History:  History  Alcohol Use  . 3.6 oz/week  . 6 Cans of beer per week     History  Drug Use No    Social History   Social History  . Marital Status: Divorced    Spouse Name: N/A  . Number of Children: N/A  . Years of Education: N/A   Social History Main Topics  . Smoking status: Current Every Day Smoker  . Smokeless tobacco: None  . Alcohol Use: 3.6 oz/week    6 Cans of beer per week  . Drug Use: No  . Sexual Activity: Not Asked   Other Topics Concern  . None   Social History Narrative   Additional Social History:    Pain Medications: Given Ibuprofen at ED Prescriptions: see PTA meds Over the Counter: denies History of alcohol / drug use?: Yes Longest period of sobriety (when/how long): pt reports being in rehab in 1999 Negative Consequences of Use: Legal, Personal relationships, Financial Withdrawal Symptoms: Agitation, Seizures, Tremors, Diarrhea Name of Substance 1:  Alcohol 1 - Age of First Use: conflicting reports "1999" to one staff, reports to Clinical research associate "2014" 1 - Amount (size/oz): 3 "tall boys" a day 1 - Frequency: daily 1 - Duration: ongoing 1 - Last Use / Amount: 11/13/15                  Sleep: Fair  Appetite:  Fair  Current Medications: Current Facility-Administered Medications  Medication Dose Route Frequency Provider Last Rate Last Dose  . acetaminophen (TYLENOL) tablet 650 mg  650 mg Oral Q6H PRN Rachael Fee, MD   650 mg at 11/19/15 2139  . alum & mag hydroxide-simeth (MAALOX/MYLANTA) 200-200-20 MG/5ML suspension 30 mL  30 mL Oral Q4H PRN Rachael Fee, MD      . citalopram (CELEXA) tablet 30 mg  30 mg Oral Daily Rachael Fee, MD   30 mg at 11/23/15 0754  . feeding supplement (ENSURE ENLIVE) (ENSURE ENLIVE) liquid 237 mL  237 mL Oral TID BM Rachael Fee, MD   237 mL at 11/23/15 1301  . hydrOXYzine (ATARAX/VISTARIL) tablet 25 mg  25 mg Oral Q6H PRN Thermon Leyland, NP      . ibuprofen (ADVIL,MOTRIN) tablet 600 mg  600 mg Oral Q4H PRN Rachael Fee, MD   600 mg at 11/23/15 1646  . loperamide (IMODIUM) capsule 2 mg  2 mg Oral PRN Rachael Fee, MD  2 mg at 11/15/15 1431  . magnesium hydroxide (MILK OF MAGNESIA) suspension 30 mL  30 mL Oral Daily PRN Rachael Fee, MD      . multivitamin with minerals tablet 1 tablet  1 tablet Oral Daily Rachael Fee, MD   1 tablet at 11/23/15 0754  . nicotine (NICODERM CQ - dosed in mg/24 hours) patch 21 mg  21 mg Transdermal Daily Rachael Fee, MD   21 mg at 11/23/15 0753  . thiamine (VITAMIN B-1) tablet 100 mg  100 mg Oral Daily Rachael Fee, MD   100 mg at 11/23/15 0754  . traMADol (ULTRAM) tablet 50 mg  50 mg Oral Q6H PRN Rachael Fee, MD   50 mg at 11/23/15 1451  . traZODone (DESYREL) tablet 100 mg  100 mg Oral QHS PRN Rachael Fee, MD   100 mg at 11/22/15 2202    Lab Results: No results found for this or any previous visit (from the past 48 hour(s)).  Blood Alcohol level:  Lab Results   Component Value Date   [facility] 251* 11/14/2015    Physical Findings: AIMS: Facial and Oral Movements Muscles of Facial Expression: None, normal Lips and Perioral Area: None, normal Jaw: None, normal Tongue: None, normal,Extremity Movements Upper (arms, wrists, hands, fingers): None, normal Lower (legs, knees, ankles, toes): None, normal, Trunk Movements Neck, shoulders, hips: None, normal, Overall Severity Severity of abnormal movements (highest score from questions above): None, normal Incapacitation due to abnormal movements: None, normal Patient's awareness of abnormal movements (rate only patient's report): No Awareness, Dental Status Current problems with teeth and/or dentures?: No Does patient usually wear dentures?: No  CIWA:  CIWA-Ar Total: 1 COWS:  COWS Total Score: 1  Musculoskeletal: Strength & Muscle Tone: within normal limits Gait & Station: limp Patient leans: Right  Psychiatric Specialty Exam: Review of Systems  Constitutional: Negative.   HENT: Negative.   Eyes: Negative.   Respiratory: Negative.   Cardiovascular: Negative.   Gastrointestinal: Negative.   Genitourinary: Negative.   Musculoskeletal: Positive for joint pain.  Skin: Negative.   Neurological: Negative.   Endo/Heme/Allergies: Negative.   Psychiatric/Behavioral: Positive for depression and substance abuse. The patient is nervous/anxious.     Blood pressure 124/73, pulse 57, temperature 98.6 F (37 C), temperature source Oral, resp. rate 16, height  (1.753 m), weight 81.647 kg (180 lb), SpO2 98 %.Body mass index is 26.57 kg/(m^2).  General Appearance: Fairly Groomed  Patent attorney::  Fair  Speech:  deaf  Volume:  deaf  Mood:  Anxious and worried  Affect:  anxious worried  Thought Process:  Coherent and Goal Directed  Orientation:  Full (Time, Place, and Person)  Thought Content:  symptoms events worries concerns  Suicidal Thoughts:  No  Homicidal Thoughts:  No  Memory:  Immediate;    Fair Recent;   Fair Remote;   Fair  Judgement:  Fair  Insight:  Present and Shallow  Psychomotor Activity:  Restlessness  Concentration:  Fair  Recall:  [employer] of Knowledge:Fair  Language: Fair  Akathisia:  No  Handed:  Right  AIMS (if indicated):     Assets:  Desire for Improvement  ADL's:  Intact  Cognition: WNL  Sleep:  Number of Hours: 6.5   Treatment Plan Summary: Daily contact with patient to assess and evaluate symptoms and progress in treatment and Medication management Supportive approach/coping skills Alcohol dependence; continue to work a relapse prevention plan Depression/anxiety; increase the Celexa to 40  mg  Work with CBT/mindfulness Facilitate admission to [facility] Rachael Fee, MD 11/23/2015, 5:20 PM

## 2015-11-23 NOTE — [facility] Group Notes (Signed)
Late Entry:  [facility] LCSW Group Therapy 11/22/2015  1:15 pm  Type of Therapy: Group Therapy Participation Level: Active  Participation Quality: Attentive, Sharing and Supportive  Affect: Appropriate  Cognitive: Alert and Oriented  Insight: Developing/Improving and Engaged  Engagement in Therapy: Developing/Improving and Engaged  Modes of Intervention: Clarification, Confrontation, Discussion, Education, Exploration,  Limit-setting, Orientation, Problem-solving, Rapport Building, Dance movement psychotherapist, Socialization and Support  Summary of Progress/Problems: CSW and patients reviewed community resources including AA/NA groups, [employer] [place] support groups, housing resources, and grief counseling. Patients discussed which programs would be beneficial for them in their recovery. Patient shared that he is feeling much better since admission and wants to continue his treatment at discharge. He identifies lack of stable housing and unresolved grief as obstacles. Patient provided with information on grief counseling and housing resources.   [name], LCSW Clinical Social Worker [facility] [phone]

## 2015-11-23 NOTE — [facility] Group Notes (Addendum)
The focus of this group is to educate the patient on the purpose and policies of crisis stabilization and provide a format to answer questions about their admission.  The group details unit policies and expectations of patients while admitted.  Patient attended 0900 nurse education orientation group this morning.  Patient actively participated and had appropriate affect.  Patient was alert.  Patient had appropriate insight and appropriate engagement.  Today patient will work on 3 goals for discharge.  Interpreter was with patient in group.

## 2015-11-23 NOTE — Progress Notes (Signed)
Lee [name]'s interpreter is available for interpretation on the unit this evening. Lee [name] rates his pain as 6/10, anxiety 7/10 and Depression 6/10. He denies SI/HI/AVH and contracts for safety. He attended group tonight along with his interpreter. After group he requested his pm medications. He is currently resting in his room at this time. Encouragement and support given. Medications administered as prescribed. Continue Q 15 minute checks for patient safety and medication effectiveness.

## 2015-11-23 NOTE — Progress Notes (Signed)
[facility] Group Notes:  (Nursing/MHT/Case Management/Adjunct)  Date:  11/23/2015  Time:  2045  Type of Therapy:  wrap up group  Participation Level:  Active  Participation Quality:  Appropriate, Attentive, Sharing and Supportive  Affect:  Appropriate  Cognitive:  Appropriate  Insight:  Improving  Engagement in Group:  Engaged  Modes of Intervention:  Clarification, Education and Support  Summary of Progress/Problems: Pt shares that he hopes to discharge to [facility] tomorrow if a bed is available. Pt reports feeling much better emotionally and physically but could use more therapy in way of support groups and 12 step programs.  Pt is preparing for a  30 day stay and plans to return to [place] to live with sober girlfriend in [place] close to resources.   Shelah Lewandowsky 11/23/2015, 9:30 PM

## 2015-11-23 NOTE — Plan of Care (Signed)
Problem: Alteration in mood Goal: LTG-Patient reports reduction in suicidal thoughts (Patient reports reduction in suicidal thoughts and is able to verbalize a safety plan for whenever patient is feeling suicidal)  Outcome: Progressing Nurse discussed depression/coping skills with patient.        

## 2015-11-23 NOTE — Progress Notes (Signed)
D:  Patient's self inventory sheet, patient has good sleep, sleep medication is helpful.  Good appetite, normal energy level, good concentration.  Rated depression and hopeless 7, anxiety 6.  Denied withdrawals.  Denied suicidal thoughts,  Stated he does have pain, worst pain #7 in past 24 hours.  Pain medication is helpful.  Goal is to talk to MD and nurse.  No discharge plans. A:  Medications administered per MD orders.  Emotional support and encouragement given patient. R:  Patient denied SI and HI, contracts for safety.  Denied A/V hallucinations.  Safety maintained with 15 minute checks.

## 2015-11-23 NOTE — Progress Notes (Signed)
Pt continues to improve and become more steady.  He is still using the walker to ambulate.  He is still waiting for a bed at [facility] for further SA treatment.  He denies SI/HI/AVH at this time.  The interpreter was here for group time.  His main complaint continues to be his L ankle pain, and he is regularly asking for the Tramadol.  Pt is encouraged to elevate his ankle as much as possible.  Pt makes his needs known to staff.  Support and encouragement offered.  Discharge plans are in process.  Safety maintained with q15 minute checks.

## 2015-11-24 NOTE — Progress Notes (Signed)
Recreation Therapy Notes  Date: 02.24.2017 Time: 9:30am Location: [address] Hall Group Room  Group Topic: Stress Management  Goal Area(s) Addresses:  Patient will actively participate in stress management techniques presented during session.   Behavioral Response: Appropriate, Engaged.   Intervention: Stress management techniques  Activity :  Guided Imagery. LRT provided instruction and demonstration on practice of Guided Imagery.   Education:  Stress Management, Discharge Planning.   Education Outcome: Acknowledges education  Clinical Observations/Feedback: Patient actively engaged in technique introduced, expressed no concerns and demonstrated ability to practice independently post d/c.   Jafar Poffenberger L Lizet Kelso, LRT/CTRS  Rhett Mutschler L 11/24/2015 10:11 AM 

## 2015-11-24 NOTE — Plan of Care (Signed)
Problem: Alteration in mood & ability to function due to Goal: STG-Patient will report withdrawal symptoms Outcome: Completed/Met Date Met:  11/24/15 Patient denies withdrawal.  Problem: Alteration in mood Goal: STG-Patient is able to discuss feelings and issues (Patient is able to discuss feelings and issues leading to depression)  Outcome: Progressing Via interpreter and written notes, patient forthcoming with feelings, issues.

## 2015-11-24 NOTE — [facility] Group Notes (Signed)
[facility] LCSW Group Therapy  11/24/2015 3:19 PM  Type of Therapy:  Group Therapy  Participation Level:  Active  Participation Quality:  Attentive  Affect:  Appropriate  Cognitive:  Alert and Oriented  Insight:  Improving  Engagement in Therapy:  Improving  Modes of Intervention:  Confrontation, Discussion, Education, Exploration, Problem-solving, Rapport Building, Socialization and Support  Summary of Progress/Problems: Feelings around Relapse. Group members discussed the meaning of relapse and shared personal stories of relapse, how it affected them and others, and how they perceived themselves during this time. Group members were encouraged to identify triggers, warning signs and coping skills used when facing the possibility of relapse. Social supports were discussed and explored in detail. Lee [name] shared that his trigger for alcohol abuse was the death of his two adult children. He shared that it tore him apart and he could not cope with those losses. He decided to get help for his alcoholism after realizing that he was harming his body and losing money and important things in his life due to drinking. He continues to show progress in the group setting and improving insight.   Smart, [name] LCSW 11/24/2015, 3:19 PM

## 2015-11-24 NOTE — Tx Team (Signed)
Interdisciplinary Treatment Plan Update (Adult) Date: 11/24/2015    Time Reviewed: 9:30 AM  Progress in Treatment: Attending groups: Yes Participating in groups: Yes Taking medication as prescribed: Yes Tolerating medication: Yes Family/Significant other contact made: Yes, CSW has spoken with girlfriend Patient understands diagnosis: Yes Discussing patient identified problems/goals with staff: Yes Medical problems stabilized or resolved: Yes Denies suicidal/homicidal ideation: Yes Issues/concerns per patient self-inventory: Yes Other:  New problem(s) identified: N/A  Discharge Plan or Barriers: Patient is interested in residential treatment at discharge. [facility] referral pending. Patient not eligible for [facility] or [facility] due to pending legal charges.   Reason for Continuation of Hospitalization:  Depression Anxiety Medication Stabilization   Comments: N/A  Estimated length of stay: 1-2 days    Patient is a 60 year old male with a diagnosis of substance induced mood disorder, Major Depressive Disorder, and Alcohol Use Disorder. Pt presented to the hospital with suicidal ideations and alcohol abuse. Pt reports primary trigger(s) for admission were housing and relationship stressors, as well as experiencing grief. Patient will benefit from crisis stabilization, medication evaluation, group therapy and psycho education in addition to case management for discharge planning. At discharge, it is recommended that Pt remain compliant with established discharge plan and continued treatment.   Review of initial/current patient goals per problem list:  1. Goal(s): Patient will participate in aftercare plan   Met: Goal progressing.   Target date: 3-5 days post admission date   As evidenced by: Patient will participate within aftercare plan AEB aftercare provider and housing plan at discharge being identified.  2/16: Goal progressing. Patient interested in residential  treatment.  2/21: [facility] referral pending.   2/24: [facility] referral pending. Patient not eligible for [facility] or [facility] due to pending legal charges.     2. Goal (s): Patient will exhibit decreased depressive symptoms and suicidal ideations.   Met: Goal progressing   Target date: 3-5 days post admission date   As evidenced by: Patient will utilize self rating of depression at 3 or below and demonstrate decreased signs of depression or be deemed stable for discharge by MD.   2/16: Goal progressing. Patient continues to endorse depression but is attending groups.  2/20: Patient rates depression at 8, denies SI.  2/24: Goal progressing. Patient rates depression at 6, denies SI.    4. Goal(s): Patient will demonstrate decreased signs of withdrawal due to substance abuse   Met: Yes   Target date: 3-5 days post admission date   As evidenced by: Patient will produce a CIWA/COWS score of 0, have stable vitals signs, and no symptoms of withdrawal  2/16: Goal not met: Pt continues to have withdrawal symptoms of headache, anxiety, and tremor and a CIWA score of a 3.  Pt to show decrease withdrawal symptoms prior to d/c.  2/21: Goal met. No withdrawal symptoms reported at this time per medical chart.    Attendees: Patient:    Family:    Physician: Dr. Parke Poisson; Dr. Sabra Heck 11/24/2015 9:30 AM  Nursing: Maureen Chatters, Diona Foley Tillson RN 11/24/2015 9:30 AM  Clinical Social Worker: Erasmo Downer [place], LCSW 11/24/2015 9:30 AM  Other: Peri Maris, LCSWA; [place], LCSW  11/24/2015 9:30 AM  Other:  11/24/2015 9:30 AM  Other: Lars Pinks, Case Manager 11/24/2015 9:30 AM  Other: Agustina Caroli, NP 11/24/2015 9:30 AM  Other:    Other:    Other:      Scribe for Treatment Team:  Tilden Fossa, [place]

## 2015-11-24 NOTE — Progress Notes (Signed)
[facility] MD Progress Note  11/24/2015 5:18 PM Lee [name]  MRN:  [id number] Subjective:  Lee [name] is sleeping better, he is eating well. His ankle is still hurting but it is better. He has tolerated the increase in Celexa well. Objectively less anxious and worried Principal Problem: Severe recurrent major depression without psychotic features (HCC) Diagnosis:   Patient Active Problem List   Diagnosis Date Noted  . Alcohol dependence with uncomplicated withdrawal (HCC) [F10.230]   . Alcohol dependence (HCC) [F10.20] 11/15/2015  . Severe recurrent major depression without psychotic features (HCC) [F33.2] 11/15/2015   Total Time spent with patient: 15 minutes  Past Psychiatric History: see admission H and P  Past Medical History:  Past Medical History  Diagnosis Date  . Deaf     Past Surgical History  Procedure Laterality Date  . Neck surgery     Family History: History reviewed. No pertinent family history. Family Psychiatric  History: see admission H and P Social History:  History  Alcohol Use  . 3.6 oz/week  . 6 Cans of beer per week     History  Drug Use No    Social History   Social History  . Marital Status: Divorced    Spouse Name: N/A  . Number of Children: N/A  . Years of Education: N/A   Social History Main Topics  . Smoking status: Current Every Day Smoker  . Smokeless tobacco: None  . Alcohol Use: 3.6 oz/week    6 Cans of beer per week  . Drug Use: No  . Sexual Activity: Not Asked   Other Topics Concern  . None   Social History Narrative   Additional Social History:    Pain Medications: Given Ibuprofen at ED Prescriptions: see PTA meds Over the Counter: denies History of alcohol / drug use?: Yes Longest period of sobriety (when/how long): pt reports being in rehab in 1999 Negative Consequences of Use: Legal, Personal relationships, Financial Withdrawal Symptoms: Agitation, Seizures, Tremors, Diarrhea Name of Substance 1: Alcohol 1 - Age of  First Use: conflicting reports "1999" to one staff, reports to Clinical research associate "2014" 1 - Amount (size/oz): 3 "tall boys" a day 1 - Frequency: daily 1 - Duration: ongoing 1 - Last Use / Amount: 11/13/15                  Sleep: Fair  Appetite:  Fair  Current Medications: Current Facility-Administered Medications  Medication Dose Route Frequency Provider Last Rate Last Dose  . acetaminophen (TYLENOL) tablet 650 mg  650 mg Oral Q6H PRN Rachael Fee, MD   650 mg at 11/19/15 2139  . alum & mag hydroxide-simeth (MAALOX/MYLANTA) 200-200-20 MG/5ML suspension 30 mL  30 mL Oral Q4H PRN Rachael Fee, MD      . citalopram (CELEXA) tablet 40 mg  40 mg Oral Daily Rachael Fee, MD   40 mg at 11/24/15 0800  . feeding supplement (ENSURE ENLIVE) (ENSURE ENLIVE) liquid 237 mL  237 mL Oral TID BM Rachael Fee, MD   237 mL at 11/24/15 1422  . hydrOXYzine (ATARAX/VISTARIL) tablet 25 mg  25 mg Oral Q6H PRN Thermon Leyland, NP      . ibuprofen (ADVIL,MOTRIN) tablet 600 mg  600 mg Oral Q4H PRN Rachael Fee, MD   600 mg at 11/24/15 1707  . loperamide (IMODIUM) capsule 2 mg  2 mg Oral PRN Rachael Fee, MD   2 mg at 11/15/15 1431  . magnesium hydroxide (MILK OF  MAGNESIA) suspension 30 mL  30 mL Oral Daily PRN Rachael Fee, MD      . multivitamin with minerals tablet 1 tablet  1 tablet Oral Daily Rachael Fee, MD   1 tablet at 11/24/15 0800  . nicotine (NICODERM CQ - dosed in mg/24 hours) patch 21 mg  21 mg Transdermal Daily Rachael Fee, MD   21 mg at 11/24/15 0802  . thiamine (VITAMIN B-1) tablet 100 mg  100 mg Oral Daily Rachael Fee, MD   100 mg at 11/24/15 0800  . traMADol (ULTRAM) tablet 50 mg  50 mg Oral Q6H PRN Rachael Fee, MD   50 mg at 11/24/15 1422  . traZODone (DESYREL) tablet 100 mg  100 mg Oral QHS PRN Rachael Fee, MD   100 mg at 11/23/15 2208    Lab Results: No results found for this or any previous visit (from the past 48 hour(s)).  Blood Alcohol level:  Lab Results  Component Value Date    [facility] 251* 11/14/2015    Physical Findings: AIMS: Facial and Oral Movements Muscles of Facial Expression: None, normal Lips and Perioral Area: None, normal Jaw: None, normal Tongue: None, normal,Extremity Movements Upper (arms, wrists, hands, fingers): None, normal Lower (legs, knees, ankles, toes): None, normal, Trunk Movements Neck, shoulders, hips: None, normal, Overall Severity Severity of abnormal movements (highest score from questions above): None, normal Incapacitation due to abnormal movements: None, normal Patient's awareness of abnormal movements (rate only patient's report): No Awareness, Dental Status Current problems with teeth and/or dentures?: No Does patient usually wear dentures?: No  CIWA:  CIWA-Ar Total: 0 COWS:  COWS Total Score: 0  Musculoskeletal: Strength & Muscle Tone: within normal limits Gait & Station: normal Patient leans: normal  Psychiatric Specialty Exam: Review of Systems  Constitutional: Negative.   HENT: Negative.   Eyes: Negative.   Respiratory: Negative.   Cardiovascular: Negative.   Gastrointestinal: Negative.   Genitourinary: Negative.   Musculoskeletal: Positive for joint pain.  Skin: Negative.   Neurological: Negative.   Endo/Heme/Allergies: Negative.   Psychiatric/Behavioral: Positive for substance abuse. The patient is nervous/anxious.     Blood pressure 121/73, pulse 57, temperature 97.6 F (36.4 C), temperature source Oral, resp. rate 20, height  (1.753 m), weight 81.647 kg (180 lb), SpO2 98 %.Body mass index is 26.57 kg/(m^2).  General Appearance: Fairly Groomed  Patent attorney::  Fair  Speech:  deaf  Volume:  deaf  Mood:  worried  Affect:  Appropriate  Thought Process:  Coherent and Goal Directed  Orientation:  Full (Time, Place, and Person)  Thought Content:  worries and concerns wanting to go to rehab afraid of not being able to go  Suicidal Thoughts:  No  Homicidal Thoughts:  No  Memory:  Immediate;    Fair Recent;   Fair Remote;   Fair  Judgement:  Fair  Insight:  Present  Psychomotor Activity:  Normal  Concentration:  Fair  Recall:  [employer] of Knowledge:Fair  Language: Fair  Akathisia:  No  Handed:  Right  AIMS (if indicated):     Assets:  Desire for Improvement  ADL's:  Intact  Cognition: WNL  Sleep:  Number of Hours: 5.75   Treatment Plan Summary: Daily contact with patient to assess and evaluate symptoms and progress in treatment and Medication management Supportive approach/coping skills Alcohol dependence; continue to work a relapse prevention plan Depression/anxiety; continue the Celexa 40 mg daily Work with CBT/mindfulness Facilitate admission to  [facility] when bed available Marilena Trevathan A, MD 11/24/2015, 5:18 PM

## 2015-11-24 NOTE — [facility] Group Notes (Signed)
   [facility] LCSW Aftercare Discharge Planning Group Note  11/24/2015  8:45 AM   Participation Quality: Alert, Appropriate and Oriented  Mood/Affect: Appropriate  Depression Rating: 6  Anxiety Rating: 5  Thoughts of Suicide: Pt denies SI/HI  Will you contract for safety? Yes  Current AVH: Pt denies  Plan for Discharge/Comments: Pt attended discharge planning group and actively participated in group. CSW provided pt with today's workbook. Patient reports feeling "a little better today". He continues to be interested in residential treatment. [facility] referral pending.   Transportation Means: Pt denies access to transportation  Supports: No supports mentioned at this time  [name], MSW, [employer] Clinical Social Worker [employer] [phone]

## 2015-11-24 NOTE — Progress Notes (Signed)
Patient has been up and visible in the milieu. Interpreters here throughout the day and patient animated and interacting appropriately. Some anxiety, depression noted and patient rates both at a 6/10 along with hopelessness. Continues to experience R ankle pain and is receiving prn meds. Patient medicated per orders. Emotional support offered. Fall precautions reviewed. Self inventory evaluated. He denies SI/HI and remains safe on level III obs. Lee [name]

## 2015-11-25 ENCOUNTER — Encounter (HOSPITAL_COMMUNITY): Payer: Self-pay | Admitting: Registered Nurse

## 2015-11-25 DIAGNOSIS — M25579 Pain in unspecified ankle and joints of unspecified foot: Secondary | ICD-10-CM | POA: Insufficient documentation

## 2015-11-25 DIAGNOSIS — F4323 Adjustment disorder with mixed anxiety and depressed mood: Secondary | ICD-10-CM | POA: Insufficient documentation

## 2015-11-25 NOTE — Progress Notes (Signed)
[facility] MD Progress Note  11/25/2015 10:00 AM Lee [name]  MRN:  [id number]    Subjective:   Patient seems by this provider, case reviewed with social worker and nursing.  On evaluation:  [name] I'm a little depressed and anxiety.  Reports depression 6-7/10 and anxiety 6/10.  States that improved since admission "gradually going down.  Tolerating mediations without adverse reaction; attending/participating in group sessions.  Denies suicidal/homicidal ideation, psychosis, and paranoia.  Also denies withdrawal symptoms.  Still has complaints with ankle pain (Twisted ankle while jumping over a fence when drunk.)  Principal Problem: Severe recurrent major depression without psychotic features (HCC) Diagnosis:   Patient Active Problem List   Diagnosis Date Noted  . Alcohol dependence with uncomplicated withdrawal (HCC) [F10.230]   . Alcohol dependence (HCC) [F10.20] 11/15/2015  . Severe recurrent major depression without psychotic features (HCC) [F33.2] 11/15/2015   Total Time spent with patient: 15 minutes  Past Psychiatric History: see admission H and P  Past Medical History:  Past Medical History  Diagnosis Date  . Deaf     Past Surgical History  Procedure Laterality Date  . Neck surgery     Family History: History reviewed. No pertinent family history. Family Psychiatric  History: see admission H and P Social History:  History  Alcohol Use  . 3.6 oz/week  . 6 Cans of beer per week     History  Drug Use No    Social History   Social History  . Marital Status: Divorced    Spouse Name: N/A  . Number of Children: N/A  . Years of Education: N/A   Social History Main Topics  . Smoking status: Current Every Day Smoker  . Smokeless tobacco: None  . Alcohol Use: 3.6 oz/week    6 Cans of beer per week  . Drug Use: No  . Sexual Activity: Not Asked   Other Topics Concern  . None   Social History Narrative   Additional Social History:    Pain Medications:  Given Ibuprofen at ED Prescriptions: see PTA meds Over the Counter: denies History of alcohol / drug use?: Yes Longest period of sobriety (when/how long): pt reports being in rehab in 1999 Negative Consequences of Use: Legal, Personal relationships, Financial Withdrawal Symptoms: Agitation, Seizures, Tremors, Diarrhea Name of Substance 1: Alcohol 1 - Age of First Use: conflicting reports "1999" to one staff, reports to Clinical research associate "2014" 1 - Amount (size/oz): 3 "tall boys" a day 1 - Frequency: daily 1 - Duration: ongoing 1 - Last Use / Amount: 11/13/15    Sleep: Good, with medication  Appetite:  Good  Current Medications: Current Facility-Administered Medications  Medication Dose Route Frequency Provider Last Rate Last Dose  . acetaminophen (TYLENOL) tablet 650 mg  650 mg Oral Q6H PRN Rachael Fee, MD   650 mg at 11/19/15 2139  . alum & mag hydroxide-simeth (MAALOX/MYLANTA) 200-200-20 MG/5ML suspension 30 mL  30 mL Oral Q4H PRN Rachael Fee, MD      . citalopram (CELEXA) tablet 40 mg  40 mg Oral Daily Rachael Fee, MD   40 mg at 11/25/15 0753  . feeding supplement (ENSURE ENLIVE) (ENSURE ENLIVE) liquid 237 mL  237 mL Oral TID BM Rachael Fee, MD   237 mL at 11/24/15 2105  . hydrOXYzine (ATARAX/VISTARIL) tablet 25 mg  25 mg Oral Q6H PRN Thermon Leyland, NP      . ibuprofen (ADVIL,MOTRIN) tablet 600 mg  600 mg Oral  Q4H PRN Rachael Fee, MD   600 mg at 11/24/15 1707  . loperamide (IMODIUM) capsule 2 mg  2 mg Oral PRN Rachael Fee, MD   2 mg at 11/15/15 1431  . magnesium hydroxide (MILK OF MAGNESIA) suspension 30 mL  30 mL Oral Daily PRN Rachael Fee, MD      . multivitamin with minerals tablet 1 tablet  1 tablet Oral Daily Rachael Fee, MD   1 tablet at 11/25/15 [phone]  . nicotine (NICODERM CQ - dosed in mg/24 hours) patch 21 mg  21 mg Transdermal Daily Rachael Fee, MD   21 mg at 11/25/15 9604  . thiamine (VITAMIN B-1) tablet 100 mg  100 mg Oral Daily Rachael Fee, MD   100 mg at  11/25/15 0753  . traMADol (ULTRAM) tablet 50 mg  50 mg Oral Q6H PRN Rachael Fee, MD   50 mg at 11/25/15 0756  . traZODone (DESYREL) tablet 100 mg  100 mg Oral QHS PRN Rachael Fee, MD   100 mg at 11/24/15 2239    Lab Results: No results found for this or any previous visit (from the past 48 hour(s)).  Blood Alcohol level:  Lab Results  Component Value Date   [facility] 251* 11/14/2015    Physical Findings: AIMS: Facial and Oral Movements Muscles of Facial Expression: None, normal Lips and Perioral Area: None, normal Jaw: None, normal Tongue: None, normal,Extremity Movements Upper (arms, wrists, hands, fingers): None, normal Lower (legs, knees, ankles, toes): None, normal, Trunk Movements Neck, shoulders, hips: None, normal, Overall Severity Severity of abnormal movements (highest score from questions above): None, normal Incapacitation due to abnormal movements: None, normal Patient's awareness of abnormal movements (rate only patient's report): No Awareness, Dental Status Current problems with teeth and/or dentures?: No Does patient usually wear dentures?: No  CIWA:  CIWA-Ar Total: 0 COWS:  COWS Total Score: 0  Musculoskeletal: Strength & Muscle Tone: within normal limits Gait & Station: normal Patient leans: normal  Psychiatric Specialty Exam: Review of Systems  Musculoskeletal: Positive for joint pain.  Psychiatric/Behavioral: Positive for substance abuse. The patient is nervous/anxious.   All other systems reviewed and are negative.   Blood pressure 157/69, pulse 49, temperature 97.7 F (36.5 C), temperature source Oral, resp. rate 16, height  (1.753 m), weight 81.647 kg (180 lb), SpO2 98 %.Body mass index is 26.57 kg/(m^2).  General Appearance: Fairly Groomed  Patent attorney::  Good  Speech:  deaf  Volume:  deaf  Mood:  worried  Affect:  Appropriate  Thought Process:  Coherent and Goal Directed  Orientation:  Full (Time, Place, and Person)  Thought Content:   Denies hallucinations, delusions, and paranoia  Suicidal Thoughts:  No  Homicidal Thoughts:  No  Memory:  Immediate;   Good Recent;   Good Remote;   Good  Judgement:  Intact  Insight:  Present  Psychomotor Activity:  Normal  Concentration:  Fair  Recall:  Fair  Fund of Knowledge:Good  Language: Good  Akathisia:  No  Handed:  Right  AIMS (if indicated):     Assets:  Desire for Improvement  ADL's:  Intact  Cognition: WNL  Sleep:  Number of Hours: 5.75   Treatment Plan Summary: Daily contact with patient to assess and evaluate symptoms and progress in treatment and Medication management  Will continue Celexa 40 mg daily for Major Depressive Disorder; Trazodone 100 mg for Insomnia; Vistaril 25 mg Q 6 hr prn for Anxiety; and  Tramadol 50 mg Q 6 hr for Pain.  Ativan Detox Protocol for Alcohol detox/withdrawal completed.  Anxiety and depression has improved since admission and gradually continues to improve.  No complaints of alcohol withdrawal symptoms.  Insomnia improved sleeping better with medications.    Continue supportive approach/coping skills Continue Alcohol dependence; continue to work a relapse prevention plan Work with CBT/mindfulness Continue to Facilitate admission to [facility] when bed available  Kajuana Shareef, NP 11/25/2015, 10:00 AM

## 2015-11-25 NOTE — Progress Notes (Signed)
Patient ID: RALSTON VENUS, male   DOB: 06-24-1956, 60 y.o.   MRN: [id number]   Pt currently presents with an appropriate affect and anxious behavior. Per self inventory, pt rates depression, hopelessness and anxiety at a 6. Pt's daily goal is to "doctor" and they intend to do so by "nurse." Pt reports good sleep, a good appetite, normal energy and good concentration. Pt reports ankle pain when walking.   Pt provided with medications per providers orders. Pt's labs and vitals were monitored throughout the day. Pt supported emotionally and encouraged to express concerns and questions. Pt educated on medications and alternative pain management therapy. Interpreter services utilized during assessment and education.   Pt's safety ensured with 15 minute and environmental checks. Pt currently denies SI/HI and A/V hallucinations. Pt verbally agrees to seek staff if SI/HI or A/VH occurs and to consult with staff before acting on these thoughts. Pt writes about the [facility] staff "They are good." Will continue POC.

## 2015-11-25 NOTE — Progress Notes (Signed)
Louay's interpreter has been available to interpret for the early part of the shift. Lee [name] rates foot pain 4/10, Anxiety 6/10 and Depression 6/10. His goal today was to be more patient. Denies SI/HI/AVH. Encouragement and support given. Medications administered as prescribed. Continue Q 15 minute checks for patient safety and medication effectiveness.

## 2015-11-25 NOTE — Progress Notes (Signed)
Lee [name] attended group this evening. His mood remains calm and pleasant. Still endorsing foot pain and anxiety. His interpreter was on the unit this evening and was available for interpreting and signing as a means of communication between myself and [name]. Denies SI/HI/AVH. Encouragement and support given. Medications administered as prescribed. Continue Q 15 minute checks for patient safety and medication effectiveness.

## 2015-11-25 NOTE — Progress Notes (Signed)
Pt attended AA group with interpreter this evening.

## 2015-11-25 NOTE — [facility] Group Notes (Signed)
[facility] LCSW Group Therapy  11/25/2015   10:00 AM   Type of Therapy:  Group Therapy  Participation Level:  Active  Participation Quality:  Appropriate and Attentive  Affect:  Calm  Cognitive:  Alert and Appropriate  Insight:  Developing/Improving and Engaged  Engagement in Therapy:  Developing/Improving and Engaged  Modes of Intervention:  Clarification, Confrontation, Discussion, Education, Exploration, Limit-setting, Orientation, Problem-solving, Rapport Building, Dance movement psychotherapist, Socialization and Support  Summary of Progress/Problems: Today's group topic was avoiding self sabotage and enabling behaviors. Group members were asked to define self sabotage and enabling and provide examples. Group members were then asked to discuss unhealthy relationships and how to have positive healthy boundaries with those that enable. Group members were asked to process how communicating needs and establishing a plan to change the above identified behavior.  Pt present with sign language interpreter for the duration of group.  Pt appeared attentive throughout group and provided positive support to peers.  Pt states that he is waiting to get into [facility] and when he returns to home from there, he plans to go to meetings and be more positive.  Pt states that he already feels more stable since being hospitalized and is grateful for that.  Pt actively participated and was engaged in group discussion.    Lee Ivan, LCSW 11/25/2015 12:37 PM

## 2015-11-26 NOTE — Progress Notes (Signed)
Patient up and visibile in milieu. Sign language interpreter present. Patient rating his depression and anxiety both at a 7/10, hopelessness at a 6/10. Patient pleasant and cooperative. Continues to complain of L ankle pain though states it is improving. Frequently requesting prn pain meds. Patient medicated per orders. Emotional support offered. Self inventory reviewed. He denies SI/HI and remains safe on level III obs. [name]

## 2015-11-26 NOTE — Plan of Care (Signed)
Problem: Alteration in mood & ability to function due to Goal: STG: Patient verbalizes decreases in signs of withdrawal Outcome: Completed/Met Date Met:  11/26/15 Patient no longer experiencing withdrawal. Goal: STG-Patient will attend groups Outcome: Progressing Patient attends groups and participates via sign language interpreter.

## 2015-11-26 NOTE — Progress Notes (Signed)
[facility] MD Progress Note  11/26/2015 5:23 PM Lee [name]  MRN:  [id number]    Subjective:   Patient seems by this provider, case reviewed with social worker and nursing.  On evaluation:  Lee [name]  Reports that he is doing well.  States that he is tolerating mediations without adverse reaction; attending/participating in group sessions.  Denies suicidal/homicidal ideation, psychosis, and paranoia.  Also denies withdrawal symptoms. Continues to have some ankle pain but tolerable.    Principal Problem: Severe recurrent major depression without psychotic features (HCC) Diagnosis:   Patient Active Problem List   Diagnosis Date Noted  . Pain in joint, ankle and foot [M25.579]   . Adjustment disorder with mixed anxiety and depressed mood [F43.23]   . Alcohol dependence with uncomplicated withdrawal (HCC) [F10.230]   . Alcohol dependence (HCC) [F10.20] 11/15/2015  . Severe recurrent major depression without psychotic features (HCC) [F33.2] 11/15/2015   Total Time spent with patient: 15 minutes  Past Psychiatric History: see admission H and P  Past Medical History:  Past Medical History  Diagnosis Date  . Deaf     Past Surgical History  Procedure Laterality Date  . Neck surgery     Family History: History reviewed. No pertinent family history. Family Psychiatric  History: see admission H and P Social History:  History  Alcohol Use  . 3.6 oz/week  . 6 Cans of beer per week     History  Drug Use No    Social History   Social History  . Marital Status: Divorced    Spouse Name: N/A  . Number of Children: N/A  . Years of Education: N/A   Social History Main Topics  . Smoking status: Current Every Day Smoker  . Smokeless tobacco: None  . Alcohol Use: 3.6 oz/week    6 Cans of beer per week  . Drug Use: No  . Sexual Activity: Not Asked   Other Topics Concern  . None   Social History Narrative   Additional Social History:    Pain Medications: Given Ibuprofen at  ED Prescriptions: see PTA meds Over the Counter: denies History of alcohol / drug use?: Yes Longest period of sobriety (when/how long): pt reports being in rehab in 1999 Negative Consequences of Use: Legal, Personal relationships, Financial Withdrawal Symptoms: Agitation, Seizures, Tremors, Diarrhea Name of Substance 1: Alcohol 1 - Age of First Use: conflicting reports "1999" to one staff, reports to Clinical research associate "2014" 1 - Amount (size/oz): 3 "tall boys" a day 1 - Frequency: daily 1 - Duration: ongoing 1 - Last Use / Amount: 11/13/15    Sleep: Good, with medication  Appetite:  Good  Current Medications: Current Facility-Administered Medications  Medication Dose Route Frequency Provider Last Rate Last Dose  . acetaminophen (TYLENOL) tablet 650 mg  650 mg Oral Q6H PRN Rachael Fee, MD   650 mg at 11/19/15 2139  . alum & mag hydroxide-simeth (MAALOX/MYLANTA) 200-200-20 MG/5ML suspension 30 mL  30 mL Oral Q4H PRN Rachael Fee, MD      . citalopram (CELEXA) tablet 40 mg  40 mg Oral Daily Rachael Fee, MD   40 mg at 11/26/15 7829  . feeding supplement (ENSURE ENLIVE) (ENSURE ENLIVE) liquid 237 mL  237 mL Oral TID BM Rachael Fee, MD   237 mL at 11/26/15 1424  . hydrOXYzine (ATARAX/VISTARIL) tablet 25 mg  25 mg Oral Q6H PRN Thermon Leyland, NP      . ibuprofen (ADVIL,MOTRIN) tablet 600  mg  600 mg Oral Q4H PRN Rachael Fee, MD   600 mg at 11/26/15 1716  . loperamide (IMODIUM) capsule 2 mg  2 mg Oral PRN Rachael Fee, MD   2 mg at 11/15/15 1431  . magnesium hydroxide (MILK OF MAGNESIA) suspension 30 mL  30 mL Oral Daily PRN Rachael Fee, MD      . multivitamin with minerals tablet 1 tablet  1 tablet Oral Daily Rachael Fee, MD   1 tablet at 11/26/15 [phone]  . nicotine (NICODERM CQ - dosed in mg/24 hours) patch 21 mg  21 mg Transdermal Daily Rachael Fee, MD   21 mg at 11/26/15 0102  . thiamine (VITAMIN B-1) tablet 100 mg  100 mg Oral Daily Rachael Fee, MD   100 mg at 11/26/15 0807  .  traMADol (ULTRAM) tablet 50 mg  50 mg Oral Q6H PRN Rachael Fee, MD   50 mg at 11/26/15 1424  . traZODone (DESYREL) tablet 100 mg  100 mg Oral QHS PRN Rachael Fee, MD   100 mg at 11/25/15 2234    Lab Results: No results found for this or any previous visit (from the past 48 hour(s)).  Blood Alcohol level:  Lab Results  Component Value Date   [facility] 251* 11/14/2015    Physical Findings: AIMS: Facial and Oral Movements Muscles of Facial Expression: None, normal Lips and Perioral Area: None, normal Jaw: None, normal Tongue: None, normal,Extremity Movements Upper (arms, wrists, hands, fingers): None, normal Lower (legs, knees, ankles, toes): None, normal, Trunk Movements Neck, shoulders, hips: None, normal, Overall Severity Severity of abnormal movements (highest score from questions above): None, normal Incapacitation due to abnormal movements: None, normal Patient's awareness of abnormal movements (rate only patient's report): No Awareness, Dental Status Current problems with teeth and/or dentures?: No Does patient usually wear dentures?: No  CIWA:  CIWA-Ar Total: 0 COWS:  COWS Total Score: 0  Musculoskeletal: Strength & Muscle Tone: within normal limits Gait & Station: normal Patient leans: normal  Psychiatric Specialty Exam: Review of Systems  Musculoskeletal: Positive for joint pain (ankle right).  Psychiatric/Behavioral: Positive for substance abuse. The patient is nervous/anxious.   All other systems reviewed and are negative.   Blood pressure 142/74, pulse 47, temperature 97.7 F (36.5 C), temperature source Oral, resp. rate 18, height  (1.753 m), weight 81.647 kg (180 lb), SpO2 98 %.Body mass index is 26.57 kg/(m^2).  General Appearance: Fairly Groomed  Patent attorney::  Good  Speech:  deaf  Volume:  deaf  Mood:  Anxious  Affect:  Appropriate  Thought Process:  Coherent and Goal Directed  Orientation:  Full (Time, Place, and Person)  Thought Content:  Denies  hallucinations, delusions, and paranoia  Suicidal Thoughts:  No  Homicidal Thoughts:  No  Memory:  Immediate;   Good Recent;   Good Remote;   Good  Judgement:  Intact  Insight:  Present  Psychomotor Activity:  Normal  Concentration:  Fair  Recall:  Fair  Fund of Knowledge:Good  Language: Good  Akathisia:  No  Handed:  Right  AIMS (if indicated):     Assets:  Desire for Improvement  ADL's:  Intact  Cognition: WNL  Sleep:  Number of Hours: 5.75   Treatment Plan Summary: Daily contact with patient to assess and evaluate symptoms and progress in treatment and Medication management  Will continue Celexa 40 mg daily for Major Depressive Disorder; Trazodone 100 mg for Insomnia; Vistaril 25 mg  Q 6 hr prn for Anxiety; and Tramadol 50 mg Q 6 hr for Pain.  Ativan Detox Protocol for Alcohol detox/withdrawal completed.  Anxiety and depression has improved since admission and gradually continues to improve.  No complaints of alcohol withdrawal symptoms.  Insomnia improved sleeping better with medications.    Continue supportive approach/coping skills Continue Alcohol dependence; continue to work a relapse prevention plan Work with CBT/mindfulness Continue to Facilitate admission to [facility] when bed available  No changes in treatment plan at this time.    Rankin, Shuvon, NP 11/26/2015, 5:23 PM

## 2015-11-26 NOTE — Progress Notes (Signed)
Pt attended AA group this evening.  

## 2015-11-26 NOTE — Progress Notes (Signed)
Patient ID: Lee [name], male   DOB: [birth year], 60 y.o.   MRN: [id number] Adult Psychoeducational Group Note  Date: 11/25/2015 Time: 1:15pm   Group Topic/Focus:  Healthy Communication:   The focus of this group is to discuss communication, barriers to communication, as well as healthy ways to communicate with others.  Participation Level:  Active  Participation Quality:  Appropriate  Affect:  Appropriate  Cognitive:  Alert and Oriented  Insight: Appropriate  Engagement in Group:  Engaged  Modes of Intervention:  Activity, Discussion, Education and Support  Additional Comments:

## 2015-11-26 NOTE — [facility] Group Notes (Signed)
[facility] LCSW Group Therapy  11/26/2015   10:00 AM   Type of Therapy:  Group Therapy  Participation Level:  Active  Participation Quality:  Appropriate and Attentive  Affect:  Flat, Depressed  Cognitive:  Alert and Appropriate  Insight:  Developing/Improving and Engaged  Engagement in Therapy:  Developing/Improving and Engaged  Modes of Intervention:  Clarification, Confrontation, Discussion, Education, Exploration, Limit-setting, Orientation, Problem-solving, Rapport Building, Dance movement psychotherapist, Socialization and Support  Summary of Progress/Problems: The main focus of today's process group was to identify the patient's current support system and decide on other supports that can be put in place.  An emphasis was placed on using counselor, doctor, therapy groups, 12-step groups, and problem-specific support groups to expand supports, as well as doing something different than has been done before. Pt appeared attentive to group discussion but didn't share anything personally.  Pt provided positive support to peers instead as they shared.  ASL interpreter present for the duration of group.    Reyes Ivan, LCSW 11/26/2015 1:48 PM

## 2015-11-27 NOTE — [facility] Group Notes (Signed)
[facility] LCSW Group Therapy  11/27/2015 1:18 PM  Type of Therapy:  Group Therapy  Participation Level:  Active  Participation Quality:  Attentive  Affect:  Appropriate  Cognitive:  Alert and Oriented  Insight:  Engaged  Engagement in Therapy:  Improving  Modes of Intervention:  Confrontation, Discussion, Education, Exploration, Problem-solving, Rapport Building, Socialization and Support  Summary of Progress/Problems: Today's Topic: Overcoming Obstacles. Patients identified one short term goal and potential obstacles in reaching this goal. Patients processed barriers involved in overcoming these obstacles. Patients identified steps necessary for overcoming these obstacles and explored motivation (internal and external) for facing these difficulties head on. Lee [name] was attentive and engaged during today's processing group. He shared that his biggest obstacle is staying patient while he waits for a bed to become available at [facility]. He also shared that the anniversary of his daughter's death is coming up in Feb 20, 2023 and he does not know how to cope with this. He was receptive to advice and support provided by the group.   Smart, [name] LCSW 11/27/2015, 1:18 PM

## 2015-11-27 NOTE — [facility] Group Notes (Signed)
Pt attended AA meeting.  Lee [name], MHT 

## 2015-11-27 NOTE — Plan of Care (Signed)
Problem: Alteration in mood & ability to function due to Goal: LTG-Patient demonstrates decreased signs of withdrawal (Patient demonstrates decreased signs of withdrawal to the point the patient is safe to return home and continue treatment in an outpatient setting)  Outcome: Completed/Met Date Met:  11/27/15 Patient no longer experiencing withdrawal.

## 2015-11-27 NOTE — Progress Notes (Signed)
Pt attended spiritual care group on grief and loss facilitated by chaplain Burnis Kingfisher   Group opened with brief discussion and psycho-social ed around grief and loss in relationships and in relation to self - identifying life patterns, circumstances, changes that cause losses. Established group norm of speaking from own life experience. Group goal of establishing open and affirming space for members to share loss and experience with grief, normalize grief experience and provide psycho social education and grief support.    Romen was present throughout group.  He was oriented x4 and participated voluntarily.  Jakeel reported feeling fearful about the anniversary of his daughter's death in 02-12-23.  He does not know how he will feel on this day, and is worried that he will become overwhelmed.  He is looking forward to discharging to rehab  Facilitator and group explored with Sigurd what support systems are around him for this upcoming anniversary.   In speaking about support systems, Colten stated that his family lives in [place] and he has not spoken to his father in 30 years.  The group provided immediate support around Kingsten's fears and thought about how he may be able to connect with others.  Kentavius was interested in doing something to commemorate his daughter on the anniversary of her death.  He does not yet know what this might be.     Belva Crome MDiv

## 2015-11-27 NOTE — Progress Notes (Signed)
Patient's condition remains relatively unchanged. Hopeful for bed at [facility]. His affect is anxious, animated with congruent mood. Interpreter services in place TID.  Rates his depression at a 6/10, hopelessness and anxiety both at a 5/10. Continues to complain of L ankle pain. States it is worse with activity. Pain rating varies from 0-4/10. Medicated per orders, ultram prn given for pain. Encouraged to take it easy during rec time. Fall precautions reviewed and in place. Emotional support and reassurance given. Self inventory reviewed. Denies SI/HI and remains safe on level III obs.

## 2015-11-27 NOTE — [facility] Group Notes (Signed)
[facility] LCSW Aftercare Discharge Planning Group Note   11/27/2015 11:46 AM  Participation Quality:  Appropriate   Mood/Affect:  Appropriate  Depression Rating:  6  Anxiety Rating:  5  Thoughts of Suicide:  No Will you contract for safety?   NA  Current AVH:  No  Plan for Discharge/Comments:  Pt reports that he is hoping for bed to become available at [facility] soon. Pt reports he slept well; no withdrawals.   Transportation Means: unknown at this time.   Supports: girlfriend   Counselling psychologist, Research scientist (physical sciences) LCSW

## 2015-11-27 NOTE — Progress Notes (Signed)
CSW left 2nd message for Admissions office to check status of referral for Lee [name].  Trula Slade, MSW, LCSW Clinical Social Worker 11/27/2015 10:51 AM

## 2015-11-27 NOTE — Progress Notes (Signed)
D    Pt is pleasant and cooperative   He attends and participates in groups   He appears somewhat anxious and depressed but reports improved mood and feels more positive A   Verbal support given   Medications administered and effectiveness monitored   Q 15 min checks R   Pt safe at present 

## 2015-11-27 NOTE — Progress Notes (Signed)
D    Pt is pleasant and cooperative   He attends and participates in groups   He appears somewhat anxious and depressed but reports improved mood and feels more positive A   Verbal support given   Medications administered and effectiveness monitored   Q 15 min checks R   Pt safe at present

## 2015-11-27 NOTE — Progress Notes (Signed)
Updated progress notes faxed to admissions [facility] per their request.  Trula Slade, MSW, LCSW Clinical Social Worker 11/27/2015 3:13 PM

## 2015-11-27 NOTE — Progress Notes (Signed)
[facility] MD Progress Note  11/27/2015 7:34 PM Lee [name]  MRN:  [id number] Subjective:  Lee [name] states that he really needs to go to rehab. States that he feels better during the morning as as the day progresses he sees his mood going down. He asked for an increase in the Celexa. His GF came to visit this weekend. He states she is trying to get a place for them to be together. He is concerned about relapsing if he was to go back to a shelter Principal Problem: Severe recurrent major depression without psychotic features [facility]) Diagnosis:   Patient Active Problem List   Diagnosis Date Noted  . Pain in joint, ankle and foot [M25.579]   . Adjustment disorder with mixed anxiety and depressed mood [F43.23]   . Alcohol dependence with uncomplicated withdrawal (HCC) [F10.230]   . Alcohol dependence (HCC) [F10.20] 11/15/2015  . Severe recurrent major depression without psychotic features (HCC) [F33.2] 11/15/2015   Total Time spent with patient: 15 minutes  Past Psychiatric History: see admission H and P Past Medical History:  Past Medical History  Diagnosis Date  . Deaf     Past Surgical History  Procedure Laterality Date  . Neck surgery     Family History: History reviewed. No pertinent family history. Family Psychiatric  History: see admission H and P Social History:  History  Alcohol Use  . 3.6 oz/week  . 6 Cans of beer per week     History  Drug Use No    Social History   Social History  . Marital Status: Divorced    Spouse Name: N/A  . Number of Children: N/A  . Years of Education: N/A   Social History Main Topics  . Smoking status: Current Every Day Smoker  . Smokeless tobacco: None  . Alcohol Use: 3.6 oz/week    6 Cans of beer per week  . Drug Use: No  . Sexual Activity: Not Asked   Other Topics Concern  . None   Social History Narrative   Additional Social History:    Pain Medications: Given Ibuprofen at ED Prescriptions: see PTA meds Over the Counter:  denies History of alcohol / drug use?: Yes Longest period of sobriety (when/how long): pt reports being in rehab in 1999 Negative Consequences of Use: Legal, Personal relationships, Financial Withdrawal Symptoms: Agitation, Seizures, Tremors, Diarrhea Name of Substance 1: Alcohol 1 - Age of First Use: conflicting reports "1999" to one staff, reports to Clinical research associate "2014" 1 - Amount (size/oz): 3 "tall boys" a day 1 - Frequency: daily 1 - Duration: ongoing 1 - Last Use / Amount: 11/13/15                  Sleep: Fair  Appetite:  Fair  Current Medications: Current Facility-Administered Medications  Medication Dose Route Frequency Provider Last Rate Last Dose  . acetaminophen (TYLENOL) tablet 650 mg  650 mg Oral Q6H PRN Rachael Fee, MD   650 mg at 11/19/15 2139  . alum & mag hydroxide-simeth (MAALOX/MYLANTA) 200-200-20 MG/5ML suspension 30 mL  30 mL Oral Q4H PRN Rachael Fee, MD      . citalopram (CELEXA) tablet 40 mg  40 mg Oral Daily Rachael Fee, MD   40 mg at 11/27/15 0749  . feeding supplement (ENSURE ENLIVE) (ENSURE ENLIVE) liquid 237 mL  237 mL Oral TID BM Rachael Fee, MD   237 mL at 11/27/15 1435  . hydrOXYzine (ATARAX/VISTARIL) tablet 25 mg  25 mg Oral  Q6H PRN Thermon Leyland, NP      . ibuprofen (ADVIL,MOTRIN) tablet 600 mg  600 mg Oral Q4H PRN Rachael Fee, MD   600 mg at 11/27/15 1638  . loperamide (IMODIUM) capsule 2 mg  2 mg Oral PRN Rachael Fee, MD   2 mg at 11/15/15 1431  . magnesium hydroxide (MILK OF MAGNESIA) suspension 30 mL  30 mL Oral Daily PRN Rachael Fee, MD      . multivitamin with minerals tablet 1 tablet  1 tablet Oral Daily Rachael Fee, MD   1 tablet at 11/27/15 0749  . nicotine (NICODERM CQ - dosed in mg/24 hours) patch 21 mg  21 mg Transdermal Daily Rachael Fee, MD   21 mg at 11/27/15 0749  . thiamine (VITAMIN B-1) tablet 100 mg  100 mg Oral Daily Rachael Fee, MD   100 mg at 11/27/15 0749  . traMADol (ULTRAM) tablet 50 mg  50 mg Oral Q6H PRN  Rachael Fee, MD   50 mg at 11/27/15 1434  . traZODone (DESYREL) tablet 100 mg  100 mg Oral QHS PRN Rachael Fee, MD   100 mg at 11/26/15 2234    Lab Results: No results found for this or any previous visit (from the past 48 hour(s)).  Blood Alcohol level:  Lab Results  Component Value Date   [facility] 251* 11/14/2015    Physical Findings: AIMS: Facial and Oral Movements Muscles of Facial Expression: None, normal Lips and Perioral Area: None, normal Jaw: None, normal Tongue: None, normal,Extremity Movements Upper (arms, wrists, hands, fingers): None, normal Lower (legs, knees, ankles, toes): None, normal, Trunk Movements Neck, shoulders, hips: None, normal, Overall Severity Severity of abnormal movements (highest score from questions above): None, normal Incapacitation due to abnormal movements: None, normal Patient's awareness of abnormal movements (rate only patient's report): No Awareness, Dental Status Current problems with teeth and/or dentures?: No Does patient usually wear dentures?: No  CIWA:  CIWA-Ar Total: 0 COWS:  COWS Total Score: 0  Musculoskeletal: Strength & Muscle Tone: within normal limits Gait & Station: normal Patient leans: normal  Psychiatric Specialty Exam: Review of Systems  Constitutional: Negative.   HENT:       Deaf  Eyes: Negative.   Respiratory: Negative.   Cardiovascular: Negative.   Gastrointestinal: Negative.   Genitourinary: Negative.   Musculoskeletal: Positive for joint pain.  Skin: Negative.   Neurological: Negative.   Endo/Heme/Allergies: Negative.   Psychiatric/Behavioral: Positive for depression and substance abuse. The patient is nervous/anxious.     Blood pressure 151/72, pulse 54, temperature 98.1 F (36.7 C), temperature source Oral, resp. rate 20, height  (1.753 m), weight 81.647 kg (180 lb), SpO2 98 %.Body mass index is 26.57 kg/(m^2).  General Appearance: Fairly Groomed  Patent attorney::  Fair  Speech:  deaf  Volume:   deaf  Mood:  Anxious and worried  Affect:  anxious worried  Thought Process:  Coherent and Goal Directed  Orientation:  Full (Time, Place, and Person)  Thought Content:  symptoms events worries concerns  Suicidal Thoughts:  No  Homicidal Thoughts:  No  Memory:  Immediate;   Fair Recent;   Fair Remote;   Fair  Judgement:  Fair  Insight:  Present and Shallow  Psychomotor Activity:  Restlessness  Concentration:  Fair  Recall:  [employer] of Knowledge:Fair  Language: Fair  Akathisia:  No  Handed:  Right  AIMS (if indicated):     Assets:  Desire for Improvement  ADL's:  Intact  Cognition: WNL  Sleep:  Number of Hours: 4.75   Treatment Plan Summary: Daily contact with patient to assess and evaluate symptoms and progress in treatment and Medication management Supportive approach/coping skills Alcohol dependence; continue to work a relapse prevention plan Depression; will split the Celexa dose to 20 mg BID Insomnia; will work with Trazodone 100 mg HS PRN sleep Continue to facilitate admission to [facility] Rachael Fee, MD 11/27/2015, 7:34 PM

## 2015-11-28 NOTE — [facility] Group Notes (Signed)
Pt attended AA meeting.  Lee [name], MHT 

## 2015-11-28 NOTE — Progress Notes (Signed)
D: Patient spoke to Clinical research associate with interpreter tonight.  Patient states he had a good day today.  Patient states he continues to have left ankle pain.  Patient denies SI/HI and denies AVH.  Patient states after being her 2 weeks he is feeling better. A: Staff to monitor Q 15 mins for safety.  Encouragement and support offered.  Scheduled medications administered per orders.  Tramadol administered prn for pain. R: Patient remains safe on the unit.  Patient attended group tonight.  Patient visible on the unit.  Patient taking administered medications.

## 2015-11-28 NOTE — [facility] Group Notes (Signed)
[facility] LCSW Group Therapy  11/28/2015 1:32 PM  Type of Therapy:  Group Therapy  Participation Level:  Active  Participation Quality:  Attentive  Affect:  Appropriate  Cognitive:  Alert and Oriented  Insight:  Improving  Engagement in Therapy:  Engaged  Modes of Intervention:  Confrontation, Discussion, Education, Exploration, Problem-solving, Rapport Building, Socialization and Support  Summary of Progress/Problems: MHA Speaker came to talk about his personal journey with substance abuse and addiction. The pt processed ways by which to relate to the speaker. MHA speaker provided handouts and educational information pertaining to groups and services offered by the [facility].   Smart, Lee Dickerson LCSW 11/28/2015, 1:32 PM

## 2015-11-28 NOTE — [facility] Group Notes (Signed)
[facility] Group Notes:  (Nursing/MHT/Case Management/Adjunct)  Date:  11/28/2015  Time:  4:31 PM  Type of Therapy:  recovery  Participation Level:  Active  Participation Quality:  Attentive and Sharing  Affect:  flat  Cognitive:  Alert  Insight:  Appropriate  Engagement in Group:  Engaged  Modes of Intervention:  Discussion  Summary of Progress/Problems: Pt share with other pts about his problems and what has work for him in the past.  Bethann Punches 11/28/2015, 4:31 PM

## 2015-11-28 NOTE — Progress Notes (Signed)
[facility] MD Progress Note  11/28/2015 6:42 PM Lee [name]  MRN:  [id number] Subjective:  Continues to wait for the bed at [facility]. He continues to express worry concern as he feels he is not going to be able to make it if he was to be D/C without any more in house treatment He hopes to be stronger in his recovery when he gets out of [facility] and be able to find a place with his GF who is also deaf Principal Problem: Severe recurrent major depression without psychotic features (HCC) Diagnosis:   Patient Active Problem List   Diagnosis Date Noted  . Pain in joint, ankle and foot [M25.579]   . Adjustment disorder with mixed anxiety and depressed mood [F43.23]   . Alcohol dependence with uncomplicated withdrawal (HCC) [F10.230]   . Alcohol dependence (HCC) [F10.20] 11/15/2015  . Severe recurrent major depression without psychotic features (HCC) [F33.2] 11/15/2015   Total Time spent with patient: 15 minutes  Past Psychiatric History: see admission H and P  Past Medical History:  Past Medical History  Diagnosis Date  . Deaf     Past Surgical History  Procedure Laterality Date  . Neck surgery     Family History: History reviewed. No pertinent family history. Family Psychiatric  History: see admission H and P Social History:  History  Alcohol Use  . 3.6 oz/week  . 6 Cans of beer per week     History  Drug Use No    Social History   Social History  . Marital Status: Divorced    Spouse Name: N/A  . Number of Children: N/A  . Years of Education: N/A   Social History Main Topics  . Smoking status: Current Every Day Smoker  . Smokeless tobacco: None  . Alcohol Use: 3.6 oz/week    6 Cans of beer per week  . Drug Use: No  . Sexual Activity: Not Asked   Other Topics Concern  . None   Social History Narrative   Additional Social History:    Pain Medications: Given Ibuprofen at ED Prescriptions: see PTA meds Over the Counter: denies History of alcohol / drug use?:  Yes Longest period of sobriety (when/how long): pt reports being in rehab in 1999 Negative Consequences of Use: Legal, Personal relationships, Financial Withdrawal Symptoms: Agitation, Seizures, Tremors, Diarrhea Name of Substance 1: Alcohol 1 - Age of First Use: conflicting reports "1999" to one staff, reports to Clinical research associate "2014" 1 - Amount (size/oz): 3 "tall boys" a day 1 - Frequency: daily 1 - Duration: ongoing 1 - Last Use / Amount: 11/13/15                  Sleep: Fair  Appetite:  Fair  Current Medications: Current Facility-Administered Medications  Medication Dose Route Frequency Provider Last Rate Last Dose  . acetaminophen (TYLENOL) tablet 650 mg  650 mg Oral Q6H PRN Rachael Fee, MD   650 mg at 11/19/15 2139  . alum & mag hydroxide-simeth (MAALOX/MYLANTA) 200-200-20 MG/5ML suspension 30 mL  30 mL Oral Q4H PRN Rachael Fee, MD      . citalopram (CELEXA) tablet 40 mg  40 mg Oral Daily Rachael Fee, MD   40 mg at 11/28/15 0753  . feeding supplement (ENSURE ENLIVE) (ENSURE ENLIVE) liquid 237 mL  237 mL Oral TID BM Rachael Fee, MD   237 mL at 11/28/15 1528  . hydrOXYzine (ATARAX/VISTARIL) tablet 25 mg  25 mg Oral Q6H PRN Gerome Apley  Earlene Plater, NP   25 mg at 11/27/15 2124  . ibuprofen (ADVIL,MOTRIN) tablet 600 mg  600 mg Oral Q4H PRN Rachael Fee, MD   600 mg at 11/27/15 1638  . loperamide (IMODIUM) capsule 2 mg  2 mg Oral PRN Rachael Fee, MD   2 mg at 11/15/15 1431  . magnesium hydroxide (MILK OF MAGNESIA) suspension 30 mL  30 mL Oral Daily PRN Rachael Fee, MD      . multivitamin with minerals tablet 1 tablet  1 tablet Oral Daily Rachael Fee, MD   1 tablet at 11/28/15 [phone]  . nicotine (NICODERM CQ - dosed in mg/24 hours) patch 21 mg  21 mg Transdermal Daily Rachael Fee, MD   21 mg at 11/28/15 0755  . thiamine (VITAMIN B-1) tablet 100 mg  100 mg Oral Daily Rachael Fee, MD   100 mg at 11/28/15 0753  . traMADol (ULTRAM) tablet 50 mg  50 mg Oral Q6H PRN Rachael Fee, MD   50  mg at 11/28/15 0755  . traZODone (DESYREL) tablet 100 mg  100 mg Oral QHS PRN Rachael Fee, MD   100 mg at 11/27/15 2124    Lab Results: No results found for this or any previous visit (from the past 48 hour(s)).  Blood Alcohol level:  Lab Results  Component Value Date   [facility] 251* 11/14/2015    Physical Findings: AIMS: Facial and Oral Movements Muscles of Facial Expression: None, normal Lips and Perioral Area: None, normal Jaw: None, normal Tongue: None, normal,Extremity Movements Upper (arms, wrists, hands, fingers): None, normal Lower (legs, knees, ankles, toes): None, normal, Trunk Movements Neck, shoulders, hips: None, normal, Overall Severity Severity of abnormal movements (highest score from questions above): None, normal Incapacitation due to abnormal movements: None, normal Patient's awareness of abnormal movements (rate only patient's report): No Awareness, Dental Status Current problems with teeth and/or dentures?: No Does patient usually wear dentures?: No  CIWA:  CIWA-Ar Total: 0 COWS:  COWS Total Score: 0  Musculoskeletal: Strength & Muscle Tone: within normal limits Gait & Station: normal Patient leans: normal  Psychiatric Specialty Exam: Review of Systems  Constitutional: Negative.   HENT: Negative.   Eyes: Negative.   Respiratory: Negative.   Cardiovascular: Negative.   Gastrointestinal: Negative.   Genitourinary: Negative.   Musculoskeletal: Positive for joint pain.  Skin: Negative.   Neurological: Negative.   Endo/Heme/Allergies: Negative.   Psychiatric/Behavioral: Positive for substance abuse. The patient is nervous/anxious.     Blood pressure 136/75, pulse 50, temperature 97.7 F (36.5 C), temperature source Oral, resp. rate 18, height  (1.753 m), weight 81.647 kg (180 lb), SpO2 98 %.Body mass index is 26.57 kg/(m^2).  General Appearance: Fairly Groomed  Patent attorney::  Fair  Speech:  deaf  Volume:  deaf  Mood:  Anxious worried  Affect:   anxious worried  Thought Process:  Coherent and Goal Directed  Orientation:  Full (Time, Place, and Person)  Thought Content:  symptoms events worries concerns  Suicidal Thoughts:  No  Homicidal Thoughts:  No  Memory:  Immediate;   Fair Recent;   Fair Remote;   Fair  Judgement:  Fair  Insight:  Present  Psychomotor Activity:  Restlessness  Concentration:  Fair  Recall:  [employer] of Knowledge:Fair  Language: Fair  Akathisia:  No  Handed:  Right  AIMS (if indicated):     Assets:  Desire for Improvement  ADL's:  Intact  Cognition: WNL  Sleep:  Number of Hours: 6.5   Treatment Plan Summary: Daily contact with patient to assess and evaluate symptoms and progress in treatment and Medication management Supportive approach/coping skills Alcohol dependence; continue to work a relapse prevention plan Depression; continue the Celexa 20 mg BID Pain; continue to work with the Ultram/Motrin combination Work with DBT/mindfulness Facilitate admission to [facility] Rachael Fee, MD 11/28/2015, 6:42 PM

## 2015-11-28 NOTE — Plan of Care (Signed)
Problem: Ineffective individual coping Goal: LTG: Patient will report a decrease in negative feelings Outcome: Adequate for Discharge Pt denies suicidal ideation and any self harm thoughts or intents  Problem: Alteration in mood & ability to function due to Goal: LTG-Pt verbalizes understanding of importance of med regimen (Patient verbalizes understanding of importance of medication regimen and need to continue outpatient care and support groups)  Outcome: Adequate for Discharge Pt verbalizes understanding of therauputic use of medications

## 2015-11-28 NOTE — Progress Notes (Signed)
DAR NOTE: Pt present with flat affect and depressed mood in the unit. Pt has been present in the milieu watching TV with peers. Pt complained of left ankle pain, took all his meds as scheduled. As per self inventory, pt had a good night sleep, good appetite, normal energy, and good concentration. Pt rate depression at 7, hopeless ness at 6, and anxiety oat a 6. Pt goal for today is to " see a doctor." Pt's safety ensured with 15 minute and environmental checks. Pt currently denies SI/HI and A/V hallucinations. Pt verbally agrees to seek staff if SI/HI or A/VH occurs and to consult with staff before acting on these thoughts. Will continue POC.

## 2015-11-29 NOTE — Tx Team (Signed)
Interdisciplinary Treatment Plan Update (Adult) Date: 11/29/2015    Time Reviewed: 9:30 AM  Progress in Treatment: Attending groups: Yes Participating in groups: Yes Taking medication as prescribed: Yes Tolerating medication: Yes Family/Significant other contact made: Yes, CSW has spoken with girlfriend Patient understands diagnosis: Yes Discussing patient identified problems/goals with staff: Yes Medical problems stabilized or resolved: Yes Denies suicidal/homicidal ideation: Yes Issues/concerns per patient self-inventory: Yes Other:  New problem(s) identified: N/A  Discharge Plan or Barriers: Pt is on waitlist for [facility]. According to admissions dept, a bed should become available in the next few days.   Reason for Continuation of Hospitalization:  Depression Anxiety Medication Stabilization   Comments: N/A  Estimated length of stay: 1-2 days    Patient is a 60 year old male with a diagnosis of substance induced mood disorder, Major Depressive Disorder, and Alcohol Use Disorder. Pt presented to the hospital with suicidal ideations and alcohol abuse. Pt reports primary trigger(s) for admission were housing and relationship stressors, as well as experiencing grief. Patient will benefit from crisis stabilization, medication evaluation, group therapy and psycho education in addition to case management for discharge planning. At discharge, it is recommended that Pt remain compliant with established discharge plan and continued treatment.   Review of initial/current patient goals per problem list:  1. Goal(s): Patient will participate in aftercare plan   Met: Goal progressing.   Target date: 3-5 days post admission date   As evidenced by: Patient will participate within aftercare plan AEB aftercare provider and housing plan at discharge being identified.  2/16: Goal progressing. Patient interested in residential treatment.  2/21: [facility] referral pending.   2/24: [facility]  referral pending. Patient not eligible for [facility] or [facility] due to pending legal charges.   3/1: [facility] referral pending   2. Goal (s): Patient will exhibit decreased depressive symptoms and suicidal ideations.   Met: Goal progressing   Target date: 3-5 days post admission date   As evidenced by: Patient will utilize self rating of depression at 3 or below and demonstrate decreased signs of depression or be deemed stable for discharge by MD.   2/16: Goal progressing. Patient continues to endorse depression but is attending groups.  2/20: Patient rates depression at 8, denies SI.  2/24: Goal progressing. Patient rates depression at 6, denies SI.  3/1: PT denies SI/HI/AVH and rates depression as 5/10 today.     3. Goal(s): Patient will demonstrate decreased signs of withdrawal due to substance abuse   Met: Yes   Target date: 3-5 days post admission date   As evidenced by: Patient will produce a CIWA/COWS score of 0, have stable vitals signs, and no symptoms of withdrawal  2/16: Goal not met: Pt continues to have withdrawal symptoms of headache, anxiety, and tremor and a CIWA score of a 3.  Pt to show decrease withdrawal symptoms prior to d/c.  2/21: Goal met. No withdrawal symptoms reported at this time per medical chart.    Attendees: Patient:    Family:    Physician:  Dr. Sabra Heck 11/29/2015 11:36 AM   Nursing: Charise Carwin RN 11/29/2015 11:37 AM  Clinical Social Worker: Erasmo Downer Drinkard, LCSW 11/29/2015 11:37 AM   Other: Peri Maris, LCSWA; [place], LCSW  11/29/2015 11:37 AM   Other:    Other: Lars Pinks, Case Manager 11/29/2015 11:37 AM   Other: Agustina Caroli, NP 11/29/2015 11:37 AM   Other:    Other:    Other:      Scribe for Treatment  Team:  Maxie Better, MSW, LCSW Clinical Social Worker 11/29/2015 11:37 AM

## 2015-11-29 NOTE — Progress Notes (Signed)
D:Writer spoke to patient with interpreter tonight.  Patient states he had a good day today.  Patient states he is looking forward to being discharge tomorrow.  Patient states he has been sleeping well.  Patient denies Depression and Anxiety.  Patient denies SI/HI and denies AVH.  A: Staff to monitor Q 15 mins for safety.  Encouragement and support offered.  Scheduled medications administered per orders.  Tramadol administered prn for ankle pain.  Trazodone administered prn for sleep. R: Patient remains safe on the unit.  Patient attended group tonight.   Patient visible on the unit.  Patient taking administered medications

## 2015-11-29 NOTE — [facility] Group Notes (Signed)
[facility] LCSW Aftercare Discharge Planning Group Note   11/29/2015 10:47 AM  Participation Quality:  Appropriate   Mood/Affect:  Appropriate  Depression Rating:  6  Anxiety Rating:  6  Thoughts of Suicide:  No Will you contract for safety?   NA  Current AVH:  No  Plan for Discharge/Comments:  Pt reports that he is still waiting for bed at [facility] and is hopeful that one will become available in the next day or two. Pt reports that he is nervous about the anniversary of his children's death coming up in 02-09-2023. Pt reports good sleep.   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

## 2015-11-29 NOTE — Progress Notes (Signed)
DAR NOTE: Pt present with flat affect and depressed mood in the unit. Pt has been present in the milieu watching TV. Pt complained of ankle pain, took all his meds as scheduled. As per self inventory, pt had a good night sleep, good appetite, normal energy, and good concentration. Pt rate depression at 6, hopeless ness at 6, anxiety at a 5. Pt's safety ensured with 15 minute and environmental checks. Pt currently denies SI/HI and A/V hallucinations. Pt verbally agrees to seek staff if SI/HI or A/VH occurs and to consult with staff before acting on these thoughts. Will continue POC.

## 2015-11-29 NOTE — Progress Notes (Signed)
Pt attended NA group meeting this evening.   

## 2015-11-29 NOTE — Progress Notes (Signed)
Recreation Therapy Notes  Date: 03.01.2017 Time: 9:30am Location: [address] Group Room   Group Topic: Stress Management  Goal Area(s) Addresses:  Patient will actively participate in stress management techniques presented during session.   Behavioral Response: Appropriate   Intervention: Stress management techniques  Activity :  Diaphragmatic Breathing and Mindfulness. LRT provided education, instruction and demonstration on practice of Diaphragmatic Breathing and Mindfulness. Patient was asked to participate in technique introduced during session.   Education:  Stress Management, Discharge Planning.   Education Outcome: Acknowledges education/In group clarification offered/Needs additional education  Clinical Observations/Feedback: Patient actively engaged in technique introduced, expressed no concerns and demonstrated ability to practice independently post d/c.   Marykay Lex Ilee Randleman, LRT/CTRS        Teka Chanda L 11/29/2015 11:50 AM

## 2015-11-29 NOTE — Progress Notes (Signed)
[facility] MD Progress Note  11/29/2015 8:34 PM Lee [name]  MRN:  [id number] Subjective:  Lee [name] is still waiting for an ADA[facility] bed, He is worried as is afraid he will not be able to go and states he would be afraid of relapsing. States he does not want to relapse. Wants after the rehab come back to [facility] where he hopes his GF would have found a place for them to live. He is C/O pain in his neck arms. States when it rains or is about to rain he experiences pain Principal Problem: Severe recurrent major depression without psychotic features (HCC) Diagnosis:   Patient Active Problem List   Diagnosis Date Noted  . Pain in joint, ankle and foot [M25.579]   . Adjustment disorder with mixed anxiety and depressed mood [F43.23]   . Alcohol dependence with uncomplicated withdrawal (HCC) [F10.230]   . Alcohol dependence (HCC) [F10.20] 11/15/2015  . Severe recurrent major depression without psychotic features (HCC) [F33.2] 11/15/2015   Total Time spent with patient: 15 minutes  Past Psychiatric History: see admission H and P  Past Medical History:  Past Medical History  Diagnosis Date  . Deaf     Past Surgical History  Procedure Laterality Date  . Neck surgery     Family History: History reviewed. No pertinent family history. Family Psychiatric  History: see admission H and P Social History:  History  Alcohol Use  . 3.6 oz/week  . 6 Cans of beer per week     History  Drug Use No    Social History   Social History  . Marital Status: Divorced    Spouse Name: N/A  . Number of Children: N/A  . Years of Education: N/A   Social History Main Topics  . Smoking status: Current Every Day Smoker  . Smokeless tobacco: None  . Alcohol Use: 3.6 oz/week    6 Cans of beer per week  . Drug Use: No  . Sexual Activity: Not Asked   Other Topics Concern  . None   Social History Narrative   Additional Social History:    Pain Medications: Given Ibuprofen at ED Prescriptions: see PTA  meds Over the Counter: denies History of alcohol / drug use?: Yes Longest period of sobriety (when/how long): pt reports being in rehab in 1999 Negative Consequences of Use: Legal, Personal relationships, Financial Withdrawal Symptoms: Agitation, Seizures, Tremors, Diarrhea Name of Substance 1: Alcohol 1 - Age of First Use: conflicting reports "1999" to one staff, reports to Clinical research associate "2014" 1 - Amount (size/oz): 3 "tall boys" a day 1 - Frequency: daily 1 - Duration: ongoing 1 - Last Use / Amount: 11/13/15                  Sleep: Fair  Appetite:  Fair  Current Medications: Current Facility-Administered Medications  Medication Dose Route Frequency Provider Last Rate Last Dose  . acetaminophen (TYLENOL) tablet 650 mg  650 mg Oral Q6H PRN Rachael Fee, MD   650 mg at 11/19/15 2139  . alum & mag hydroxide-simeth (MAALOX/MYLANTA) 200-200-20 MG/5ML suspension 30 mL  30 mL Oral Q4H PRN Rachael Fee, MD      . citalopram (CELEXA) tablet 40 mg  40 mg Oral Daily Rachael Fee, MD   40 mg at 11/29/15 0751  . feeding supplement (ENSURE ENLIVE) (ENSURE ENLIVE) liquid 237 mL  237 mL Oral TID BM Rachael Fee, MD   237 mL at 11/29/15 1512  . hydrOXYzine (ATARAX/VISTARIL)  tablet 25 mg  25 mg Oral Q6H PRN Thermon Leyland, NP   25 mg at 11/27/15 2124  . ibuprofen (ADVIL,MOTRIN) tablet 600 mg  600 mg Oral Q4H PRN Rachael Fee, MD   600 mg at 11/29/15 1208  . loperamide (IMODIUM) capsule 2 mg  2 mg Oral PRN Rachael Fee, MD   2 mg at 11/15/15 1431  . magnesium hydroxide (MILK OF MAGNESIA) suspension 30 mL  30 mL Oral Daily PRN Rachael Fee, MD      . multivitamin with minerals tablet 1 tablet  1 tablet Oral Daily Rachael Fee, MD   1 tablet at 11/29/15 0751  . nicotine (NICODERM CQ - dosed in mg/24 hours) patch 21 mg  21 mg Transdermal Daily Rachael Fee, MD   21 mg at 11/29/15 0751  . thiamine (VITAMIN B-1) tablet 100 mg  100 mg Oral Daily Rachael Fee, MD   100 mg at 11/29/15 0751  . traMADol  (ULTRAM) tablet 50 mg  50 mg Oral Q6H PRN Rachael Fee, MD   50 mg at 11/29/15 1514  . traZODone (DESYREL) tablet 100 mg  100 mg Oral QHS PRN Rachael Fee, MD   100 mg at 11/28/15 2237    Lab Results: No results found for this or any previous visit (from the past 48 hour(s)).  Blood Alcohol level:  Lab Results  Component Value Date   [facility] 251* 11/14/2015    Physical Findings: AIMS: Facial and Oral Movements Muscles of Facial Expression: None, normal Lips and Perioral Area: None, normal Jaw: None, normal Tongue: None, normal,Extremity Movements Upper (arms, wrists, hands, fingers): None, normal Lower (legs, knees, ankles, toes): None, normal, Trunk Movements Neck, shoulders, hips: None, normal, Overall Severity Severity of abnormal movements (highest score from questions above): None, normal Incapacitation due to abnormal movements: None, normal Patient's awareness of abnormal movements (rate only patient's report): No Awareness, Dental Status Current problems with teeth and/or dentures?: No Does patient usually wear dentures?: No  CIWA:  CIWA-Ar Total: 0 COWS:  COWS Total Score: 0  Musculoskeletal: Strength & Muscle Tone: within normal limits Gait & Station: normal Patient leans: normal  Psychiatric Specialty Exam: Review of Systems  Constitutional: Negative.   Eyes: Negative.   Respiratory: Negative.   Cardiovascular: Negative.   Gastrointestinal: Negative.   Genitourinary: Negative.   Musculoskeletal: Positive for joint pain and neck pain.  Skin: Negative.   Endo/Heme/Allergies: Negative.   Psychiatric/Behavioral: Positive for substance abuse. The patient is nervous/anxious.     Blood pressure 119/69, pulse 61, temperature 97.8 F (36.6 C), temperature source Oral, resp. rate 18, height  (1.753 m), weight 81.647 kg (180 lb), SpO2 98 %.Body mass index is 26.57 kg/(m^2).  General Appearance: Fairly Groomed  Patent attorney::  Fair  Speech:  sign  Volume:  sign   Mood:  Anxious and worried  Affect:  Anxious and worried  Thought Process:  Coherent and Goal Directed  Orientation:  Full (Time, Place, and Person)  Thought Content:  symptoms events worries concerns  Suicidal Thoughts:  No  Homicidal Thoughts:  No  Memory:  Immediate;   Fair Recent;   Fair Remote;   Fair  Judgement:  Fair  Insight:  Present  Psychomotor Activity:  Restlessness  Concentration:  Fair  Recall:  [employer] of Knowledge:Fair  Language: Fair  Akathisia:  No  Handed:  Right  AIMS (if indicated):     Assets:  Desire for  Improvement  ADL's:  Intact  Cognition: WNL  Sleep:  Number of Hours: 6.5   Treatment Plan Summary: Daily contact with patient to assess and evaluate symptoms and progress in treatment and Medication management Supportive approach/coping skills Alcohol dependence; continue to work a relapse prevention plan Depression; continue the Celexa 20 mg BID Pain; continue the Ultram/Motrin Facilitate admission to ADACT Rachael Fee, MD 11/29/2015, 8:34 PM

## 2015-11-30 NOTE — Progress Notes (Signed)
Patient's status remains unchanged. Anxiously awaiting ADACT bed. Becoming frustrated as it is taking so long. Rating his depression at a 6/10, hopelessness and anxiety at a 5/10. L ankle pain continues ranging from a 3-5/10. Ultram prn utilized per parameters. Emotional support and reassurance offered. Medicated per orders. Self inventory reviewed. Fall precautions in place. He denies SI/HI and remains safe on level III obs. Lee [name]

## 2015-11-30 NOTE — Progress Notes (Signed)
Patient ID: Lee [name], male   DOB: 1956/08/20, 60 y.o.   MRN: [id number] PER STATE REGULATIONS 482.30  THIS CHART WAS REVIEWED FOR MEDICAL NECESSITY WITH RESPECT TO THE PATIENT'S ADMISSION/ DURATION OF STAY.  NEXT REVIEW DATE: 12/02/2015  Willa Rough, RN, BSN CASE MANAGER

## 2015-11-30 NOTE — Progress Notes (Signed)
[facility] Group Notes:  (Nursing/MHT/Case Management/Adjunct)  Date:  11/30/2015  Time:  2100  Type of Therapy:  wrap up group  Participation Level:  Minimal  Participation Quality:  Attentive and Supportive  Affect:  Flat  Cognitive:  Appropriate  Insight:  Improving  Engagement in Group:  Supportive  Modes of Intervention:  Clarification, Education and Support  Summary of Progress/Problems: Pt met with his nurse during the first part of the group to discuss progress and plans. Patients interpretor/signer was unable to come so it was difficult for patient to participate in group. Pt did sit in and was supportive. Pt was given a handout of the reading at the end of group.   Shellia Cleverly 11/30/2015, 10:15 PM

## 2015-11-30 NOTE — Progress Notes (Signed)
Patient ID: Lee [name], male   DOB: 01/20/1956, 60 y.o.   MRN: [id number]  PER STATE REGULATIONS 482.30  THIS CHART WAS REVIEWED FOR MEDICAL NECESSITY WITH RESPECT TO THE PATIENT'S ADMISSION/ DURATION OF STAY.  NEXT REVIEW DATE: 11/27/2015  Willa Rough, RN, BSN CASE MANAGER

## 2015-11-30 NOTE — Progress Notes (Signed)
[facility] MD Progress Note  11/30/2015 7:10 PM Lee [name]  MRN:  [id number] Subjective:  Lee [name] is still waiting to go to [facility]. They might have a bed in the AM. He is still worried of not having the bed and being "out there" by himself. He states he really needs the treatment if he is going to be abstinent Principal Problem: Severe recurrent major depression without psychotic features [facility]) Diagnosis:   Patient Active Problem List   Diagnosis Date Noted  . Pain in joint, ankle and foot [M25.579]   . Adjustment disorder with mixed anxiety and depressed mood [F43.23]   . Alcohol dependence with uncomplicated withdrawal (HCC) [F10.230]   . Alcohol dependence (HCC) [F10.20] 11/15/2015  . Severe recurrent major depression without psychotic features (HCC) [F33.2] 11/15/2015   Total Time spent with patient: 15 minutes  Past Psychiatric History: see admission H and P  Past Medical History:  Past Medical History  Diagnosis Date  . Deaf     Past Surgical History  Procedure Laterality Date  . Neck surgery     Family History: History reviewed. No pertinent family history. Family Psychiatric  History: see admission H and P Social History:  History  Alcohol Use  . 3.6 oz/week  . 6 Cans of beer per week     History  Drug Use No    Social History   Social History  . Marital Status: Divorced    Spouse Name: N/A  . Number of Children: N/A  . Years of Education: N/A   Social History Main Topics  . Smoking status: Current Every Day Smoker  . Smokeless tobacco: None  . Alcohol Use: 3.6 oz/week    6 Cans of beer per week  . Drug Use: No  . Sexual Activity: Not Asked   Other Topics Concern  . None   Social History Narrative   Additional Social History:    Pain Medications: Given Ibuprofen at ED Prescriptions: see PTA meds Over the Counter: denies History of alcohol / drug use?: Yes Longest period of sobriety (when/how long): pt reports being in rehab in 1999 Negative  Consequences of Use: Legal, Personal relationships, Financial Withdrawal Symptoms: Agitation, Seizures, Tremors, Diarrhea Name of Substance 1: Alcohol 1 - Age of First Use: conflicting reports "1999" to one staff, reports to Clinical research associate "2014" 1 - Amount (size/oz): 3 "tall boys" a day 1 - Frequency: daily 1 - Duration: ongoing 1 - Last Use / Amount: 11/13/15                  Sleep: Fair  Appetite:  Fair  Current Medications: Current Facility-Administered Medications  Medication Dose Route Frequency Provider Last Rate Last Dose  . acetaminophen (TYLENOL) tablet 650 mg  650 mg Oral Q6H PRN Rachael Fee, MD   650 mg at 11/19/15 2139  . alum & mag hydroxide-simeth (MAALOX/MYLANTA) 200-200-20 MG/5ML suspension 30 mL  30 mL Oral Q4H PRN Rachael Fee, MD      . citalopram (CELEXA) tablet 40 mg  40 mg Oral Daily Rachael Fee, MD   40 mg at 11/30/15 0754  . feeding supplement (ENSURE ENLIVE) (ENSURE ENLIVE) liquid 237 mL  237 mL Oral TID BM Rachael Fee, MD   237 mL at 11/30/15 1443  . hydrOXYzine (ATARAX/VISTARIL) tablet 25 mg  25 mg Oral Q6H PRN Thermon Leyland, NP   25 mg at 11/27/15 2124  . ibuprofen (ADVIL,MOTRIN) tablet 600 mg  600 mg Oral Q4H PRN  Rachael Fee, MD   600 mg at 11/29/15 1208  . loperamide (IMODIUM) capsule 2 mg  2 mg Oral PRN Rachael Fee, MD   2 mg at 11/15/15 1431  . magnesium hydroxide (MILK OF MAGNESIA) suspension 30 mL  30 mL Oral Daily PRN Rachael Fee, MD      . multivitamin with minerals tablet 1 tablet  1 tablet Oral Daily Rachael Fee, MD   1 tablet at 11/30/15 0754  . nicotine (NICODERM CQ - dosed in mg/24 hours) patch 21 mg  21 mg Transdermal Daily Rachael Fee, MD   21 mg at 11/30/15 0754  . thiamine (VITAMIN B-1) tablet 100 mg  100 mg Oral Daily Rachael Fee, MD   100 mg at 11/30/15 0754  . traMADol (ULTRAM) tablet 50 mg  50 mg Oral Q6H PRN Rachael Fee, MD   50 mg at 11/30/15 1443  . traZODone (DESYREL) tablet 100 mg  100 mg Oral QHS PRN Rachael Fee, MD   100 mg at 11/29/15 2236    Lab Results: No results found for this or any previous visit (from the past 48 hour(s)).  Blood Alcohol level:  Lab Results  Component Value Date   [facility] 251* 11/14/2015    Physical Findings: AIMS: Facial and Oral Movements Muscles of Facial Expression: None, normal Lips and Perioral Area: None, normal Jaw: None, normal Tongue: None, normal,Extremity Movements Upper (arms, wrists, hands, fingers): None, normal Lower (legs, knees, ankles, toes): None, normal, Trunk Movements Neck, shoulders, hips: None, normal, Overall Severity Severity of abnormal movements (highest score from questions above): None, normal Incapacitation due to abnormal movements: None, normal Patient's awareness of abnormal movements (rate only patient's report): No Awareness, Dental Status Current problems with teeth and/or dentures?: No Does patient usually wear dentures?: No  CIWA:  CIWA-Ar Total: 0 COWS:  COWS Total Score: 0  Musculoskeletal: Strength & Muscle Tone: within normal limits Gait & Station: normal Patient leans: normal  Psychiatric Specialty Exam: Review of Systems  Constitutional: Negative.   HENT: Negative.   Eyes: Negative.   Respiratory: Negative.   Cardiovascular: Negative.   Gastrointestinal: Negative.   Genitourinary: Negative.   Musculoskeletal: Positive for joint pain.  Skin: Negative.   Neurological: Negative.   Endo/Heme/Allergies: Negative.   Psychiatric/Behavioral: Positive for substance abuse. The patient is nervous/anxious.     Blood pressure 131/68, pulse 54, temperature 98 F (36.7 C), temperature source Oral, resp. rate 16, height  (1.753 m), weight 81.647 kg (180 lb), SpO2 98 %.Body mass index is 26.57 kg/(m^2).  General Appearance: Fairly Groomed  Patent attorney::  Fair  Speech:  sign  Volume:  sign  Mood:  Anxious and worried  Affect:  anxious worried  Thought Process:  Coherent and Goal Directed  Orientation:  Full  (Time, Place, and Person)  Thought Content:  symptoms events worries concerns  Suicidal Thoughts:  No  Homicidal Thoughts:  No  Memory:  Immediate;   Fair Recent;   Fair Remote;   Fair  Judgement:  Fair  Insight:  Present  Psychomotor Activity:  Restlessness  Concentration:  Fair  Recall:  [employer] of Knowledge:Fair  Language: Fair  Akathisia:  No  Handed:  Right  AIMS (if indicated):     Assets:  Desire for Improvement  ADL's:  Intact  Cognition: WNL  Sleep:  Number of Hours: 6.5   Treatment Plan Summary: Daily contact with patient to assess and evaluate symptoms  and progress in treatment and Medication management Supportive approach/coping skills Alcohol dependence; continue to work a relapse prevention plan Depression; continue the Celexa 20 mg BID Work with CBT/mindfulness Facilitate admission to [facility]CT Rachael Fee, MD 11/30/2015, 7:10 PM

## 2015-11-30 NOTE — Progress Notes (Signed)
Patient ID: Lee [name], male   DOB: 08-Sep-1956, 60 y.o.   MRN: [id number] PER STATE REGULATIONS 482.30  THIS CHART WAS REVIEWED FOR MEDICAL NECESSITY WITH RESPECT TO THE PATIENT'S ADMISSION/ DURATION OF STAY.  NEXT REVIEW DATE: 11/23/2015  Willa Rough, RN, BSN CASE MANAGER

## 2015-11-30 NOTE — [facility] Group Notes (Signed)
Patient was invited to attend nursing education group however elected to remain in bed.  

## 2015-11-30 NOTE — Plan of Care (Signed)
Problem: Ineffective individual coping Goal: STG: Patient will participate in after care plan Outcome: Progressing Patient agreeable to and anxious awaiting ADACT bed.

## 2015-11-30 NOTE — [facility] Group Notes (Signed)
[facility] LCSW Group Therapy  11/30/2015 12:47 PM  Type of Therapy:  Group Therapy  Participation Level:  Minimal  Participation Quality:  Attentive  Affect:  Appropriate  Cognitive:  Alert  Insight:  Engaged and Improving  Engagement in Therapy:  Engaged  Modes of Intervention:  Confrontation, Discussion, Education, Exploration, Problem-solving, Rapport Building, Socialization and Support  Summary of Progress/Problems: Emotion Regulation: This group focused on both positive and negative emotion identification and allowed group members to process ways to identify feelings, regulate negative emotions, and find healthy ways to manage internal/external emotions. Group members were asked to reflect on a time when their reaction to an emotion led to a negative outcome and explored how alternative responses using emotion regulation would have benefited them. Group members were also asked to discuss a time when emotion regulation was utilized when a negative emotion was experienced. Lee [name] shared that he continues to struggle with grief surrounding the death of his children and the upcoming anniversary of their deaths. Lee [name] stated that he hopes to get into [facility] soon in order to learn more coping skills and get more clean time before rejoining society.   Smart, Sansa Alkema LCSW 11/30/2015, 12:47 PM

## 2015-12-01 NOTE — Progress Notes (Signed)
Patient attended AA group meeting.  

## 2015-12-01 NOTE — Progress Notes (Signed)
[facility] MD Progress Note  12/01/2015 2:39 PM Lee [name]  MRN:  [id number] Subjective:  Les continues to work in his alcoholism as well as his depression/anxiety. We heard today that he will be admitted to [facility] Tuesday AM . He is cautiously optimistic that this is going to happen. Continues to endorse the need to go to a residential treatment program. States he will not be able to make it otherwise Principal Problem: Severe recurrent major depression without psychotic features (HCC) Diagnosis:   Patient Active Problem List   Diagnosis Date Noted  . Pain in joint, ankle and foot [M25.579]   . Adjustment disorder with mixed anxiety and depressed mood [F43.23]   . Alcohol dependence with uncomplicated withdrawal (HCC) [F10.230]   . Alcohol dependence (HCC) [F10.20] 11/15/2015  . Severe recurrent major depression without psychotic features (HCC) [F33.2] 11/15/2015   Total Time spent with patient: 15 minutes  Past Psychiatric History: see admission H and P  Past Medical History:  Past Medical History  Diagnosis Date  . Deaf     Past Surgical History  Procedure Laterality Date  . Neck surgery     Family History: History reviewed. No pertinent family history. Family Psychiatric  History: see admission H and P Social History:  History  Alcohol Use  . 3.6 oz/week  . 6 Cans of beer per week     History  Drug Use No    Social History   Social History  . Marital Status: Divorced    Spouse Name: N/A  . Number of Children: N/A  . Years of Education: N/A   Social History Main Topics  . Smoking status: Current Every Day Smoker  . Smokeless tobacco: None  . Alcohol Use: 3.6 oz/week    6 Cans of beer per week  . Drug Use: No  . Sexual Activity: Not Asked   Other Topics Concern  . None   Social History Narrative   Additional Social History:    Pain Medications: Given Ibuprofen at ED Prescriptions: see PTA meds Over the Counter: denies History of alcohol / drug use?:  Yes Longest period of sobriety (when/how long): pt reports being in rehab in 1999 Negative Consequences of Use: Legal, Personal relationships, Financial Withdrawal Symptoms: Agitation, Seizures, Tremors, Diarrhea Name of Substance 1: Alcohol 1 - Age of First Use: conflicting reports "1999" to one staff, reports to Clinical research associate "2014" 1 - Amount (size/oz): 3 "tall boys" a day 1 - Frequency: daily 1 - Duration: ongoing 1 - Last Use / Amount: 11/13/15                  Sleep: Fair  Appetite:  Fair  Current Medications: Current Facility-Administered Medications  Medication Dose Route Frequency Provider Last Rate Last Dose  . acetaminophen (TYLENOL) tablet 650 mg  650 mg Oral Q6H PRN Rachael Fee, MD   650 mg at 11/19/15 2139  . alum & mag hydroxide-simeth (MAALOX/MYLANTA) 200-200-20 MG/5ML suspension 30 mL  30 mL Oral Q4H PRN Rachael Fee, MD      . citalopram (CELEXA) tablet 40 mg  40 mg Oral Daily Rachael Fee, MD   40 mg at 12/01/15 0807  . feeding supplement (ENSURE ENLIVE) (ENSURE ENLIVE) liquid 237 mL  237 mL Oral TID BM Rachael Fee, MD   237 mL at 12/01/15 1257  . hydrOXYzine (ATARAX/VISTARIL) tablet 25 mg  25 mg Oral Q6H PRN Thermon Leyland, NP   25 mg at 11/27/15 2124  .  ibuprofen (ADVIL,MOTRIN) tablet 600 mg  600 mg Oral Q4H PRN Rachael Fee, MD   600 mg at 12/01/15 1610  . loperamide (IMODIUM) capsule 2 mg  2 mg Oral PRN Rachael Fee, MD   2 mg at 11/15/15 1431  . magnesium hydroxide (MILK OF MAGNESIA) suspension 30 mL  30 mL Oral Daily PRN Rachael Fee, MD      . multivitamin with minerals tablet 1 tablet  1 tablet Oral Daily Rachael Fee, MD   1 tablet at 12/01/15 [phone]  . nicotine (NICODERM CQ - dosed in mg/24 hours) patch 21 mg  21 mg Transdermal Daily Rachael Fee, MD   21 mg at 12/01/15 0807  . thiamine (VITAMIN B-1) tablet 100 mg  100 mg Oral Daily Rachael Fee, MD   100 mg at 12/01/15 0807  . traMADol (ULTRAM) tablet 50 mg  50 mg Oral Q6H PRN Rachael Fee, MD   50  mg at 12/01/15 1256  . traZODone (DESYREL) tablet 100 mg  100 mg Oral QHS PRN Rachael Fee, MD   100 mg at 11/30/15 2200    Lab Results: No results found for this or any previous visit (from the past 48 hour(s)).  Blood Alcohol level:  Lab Results  Component Value Date   [facility]* 11/14/2015    Physical Findings: AIMS: Facial and Oral Movements Muscles of Facial Expression: None, normal Lips and Perioral Area: None, normal Jaw: None, normal Tongue: None, normal,Extremity Movements Upper (arms, wrists, hands, fingers): None, normal Lower (legs, knees, ankles, toes): None, normal, Trunk Movements Neck, shoulders, hips: None, normal, Overall Severity Severity of abnormal movements (highest score from questions above): None, normal Incapacitation due to abnormal movements: None, normal Patient's awareness of abnormal movements (rate only patient's report): No Awareness, Dental Status Current problems with teeth and/or dentures?: No Does patient usually wear dentures?: No  CIWA:  CIWA-Ar Total: 0 COWS:  COWS Total Score: 0  Musculoskeletal: Strength & Muscle Tone: within normal limits Gait & Station: normal Patient leans: normal  Psychiatric Specialty Exam: Review of Systems  Constitutional: Negative.   HENT: Negative.   Eyes: Negative.   Respiratory: Negative.   Cardiovascular: Negative.   Gastrointestinal: Negative.   Genitourinary: Negative.   Musculoskeletal: Positive for joint pain.  Skin: Negative.   Neurological: Negative.   Endo/Heme/Allergies: Negative.   Psychiatric/Behavioral: Positive for depression and substance abuse. The patient is nervous/anxious.     Blood pressure 128/71, pulse 51, temperature 97.4 F (36.3 C), temperature source Oral, resp. rate 16, height  (1.753 m), weight 81.647 kg (180 lb), SpO2 98 %.Body mass index is 26.57 kg/(m^2).  General Appearance: Fairly Groomed  Patent attorney::  Fair  Speech:  sign  Volume:  sign  Mood:  Anxious and  worried  Affect:  anxious worried  Thought Process:  Coherent and Goal Directed  Orientation:  Full (Time, Place, and Person)  Thought Content:  symptoms events worries concerns  Suicidal Thoughts:  No  Homicidal Thoughts:  No  Memory:  Immediate;   Fair Recent;   Fair Remote;   Fair  Judgement:  Fair  Insight:  Present  Psychomotor Activity:  Restlessness  Concentration:  Fair  Recall:  [employer] of Knowledge:Fair  Language: Fair  Akathisia:  No  Handed:  Right  AIMS (if indicated):     Assets:  Desire for Improvement Social Support  ADL's:  Intact  Cognition: WNL  Sleep:  Number of Hours:  5.5   Treatment Plan Summary: Daily contact with patient to assess and evaluate symptoms and progress in treatment and Medication management  Supportive approach/coping skills Alcohol dependence;continue to work a relapse prevention plan Depression; continue the Celexa 20 mg BID Insomnia: continue the Trazodone 100 mg HS  Pain; continue the Ultram/Motrin Facilitate admission to [facility] on Tuesday  Caroll Cunnington A, MD 12/01/2015, 2:39 PM

## 2015-12-01 NOTE — Progress Notes (Addendum)
DAR NOTE: Pt present with flat affect and depressed mood in the unit. Pt has been observed in the day room watching TV with peers.Pt complained of left ankle pain, took all his meds as scheduled. As per self inventory, pt had a good night sleep, good appetite, normal energy, and good concentration. Pt rate depression at 6, hopeless ness at 34, and anxiety at 4. Pt goal for today is "doctor." Pt's safety ensured with 15 minute and environmental checks. Pt currently denies SI/HI and A/V hallucinations. Pt verbally agrees to seek staff if SI/HI or A/VH occurs and to consult with staff before acting on these thoughts. Will continue POC.

## 2015-12-01 NOTE — [facility] Group Notes (Signed)
[facility]   12/01/2015 11:05 AM  Participation Quality:  Appropriate   Mood/Affect:  Appropriate  Depression Rating:  6  Anxiety Rating:  4  Thoughts of Suicide:  No Will you contract for safety?   NA  Current AVH:  No  Plan for Discharge/Comments:  Pt reports that he slept well and took his medications as advised. Pt reports that he is hoping that [facility] bed becomes available soon (likely Tuesday). No other concerns relayed by pt.   Transportation Means: sheriff's dept.   Supports: girlfriend   Counselling psychologistmart, Research scientist (physical sciences)William Laske LCSW

## 2015-12-01 NOTE — Progress Notes (Signed)
Patient ID: Lee PlanRobert [name], male   DOB: Jul 24, 1956, 60 y.o.   MRN: [id number] D: Client visible on unit in dayroom watching TV, approaches writer immediately to ask for pain medication. Client c/o pain "6" of 10, right ankle from old injury. Client reports day has been "fine" Translator present. Client reports admission helpful "been here two weeks, working with the team learning a lot" "medication helps me stable out, because I was really depressed"A: Writer provided emotional support, administered Tramadol for pain(see MAR). Staff will monitor q3215min for safety. R:Client is safe on the unit, attended group.

## 2015-12-01 NOTE — Progress Notes (Signed)
Recreation Therapy Notes  Date: 03.03.2017 Time: 9:30am Location: 300 Hall Group Room   Group Topic: Stress Management  Goal Area(s) Addresses:  Patient will actively participate in stress management techniques presented during session.   Behavioral Response: Engaged, Attentive, Appropriate   Intervention: Stress management techniques  Activity :  Deep Breathing and Guided Imagery. LRT provided education, instruction and demonstration on practice of Deep Breathing and Guided Imagery. Patient was asked to participate in technique introduced during session.   Education:  Stress Management, Discharge Planning.   Education Outcome: Acknowledges education  Clinical Observations/Feedback: Patient actively engaged in technique introduced, expressed no concerns and demonstrated ability to practice independently post d/c.   Marykay Lexenise L Dymphna Wadley, LRT/CTRS  Elayah Klooster L 12/01/2015 10:18 AM

## 2015-12-01 NOTE — Progress Notes (Signed)
CSW spoke with Tega at [facility]. She is holding bed for patient on Tuesday. Interpreter scheduled to arrive at their facility for 2pm. She is requesting IVC paperwork once submitted for safe transport. Pt and MD informed of likely bed availability on Tuesday December 05, 2015. Updated records faxed to Tega in Admissions per her request.   Trula SladeHeather Smart, MSW, LCSW Clinical Social Worker 12/01/2015 11:04 AM

## 2015-12-01 NOTE — Progress Notes (Signed)
D: Pt has anxious affect and depressed mood.  Interpreter did not arrive so Clinical research associatewriter communicated with pt through written notes since pt is deaf.  Pt appeared irritable initially and wrote that he had been here for 17 days.  Pt denies SI/HI, denies hallucinations, reports left ankle pain of 9/10.  Pt has been visible in milieu and he attended evening group.  A: Introduced self to pt.  Encouragement and support offered.  AC informed that interpreter was not here.  Medications administered per order.  PRN medication administered for pain and sleep. R: Pt is compliant with medications.  Pt reports that he will immediately inform staff if he has thoughts of harming himself or others.  He reports that he will inform staff of needs and concerns.  Will continue to monitor and assess.

## 2015-12-01 NOTE — [facility] Group Notes (Signed)
[facility] LCSW Group Therapy  12/01/2015 1:31 PM  Type of Therapy:  Group Therapy  Participation Level:  Active  Participation Quality:  Attentive  Affect:  Appropriate  Cognitive:  Alert and Oriented  Insight:  Improving  Engagement in Therapy:  Improving  Modes of Intervention:  Confrontation, Discussion, Education, Exploration, Problem-solving, Rapport Building, Socialization and Support  Summary of Progress/Problems: Feelings around Relapse. Group members discussed the meaning of relapse and shared personal stories of relapse, how it affected them and others, and how they perceived themselves during this time. Group members were encouraged to identify triggers, warning signs and coping skills used when facing the possibility of relapse. Social supports were discussed and explored in detail. Lee [name] was attentive and engaged during today's processing group. He shared that he feels that grief is a big trigger for his relapse. "I have it in my mind that I won't relapse again. This is it for me. I need to take my sobriety seriously." Lee [name] states that he is excited about going to [facility] on Tuesday and feels ready for this next phase in his life.   Smart, Gyselle Matthew LCSW 12/01/2015, 1:31 PM

## 2015-12-02 MED ORDER — KETOROLAC TROMETHAMINE 10 MG PO TABS: 10.0000 mg | ORAL_TABLET | Freq: Two times a day (BID) | ORAL | Status: DC

## 2015-12-02 MED ORDER — KETOROLAC TROMETHAMINE 10 MG PO TABS
10.0000 mg | ORAL_TABLET | Freq: Two times a day (BID) | ORAL | Status: DC
  Administered 2015-12-02 – 2015-12-03 (×2): 10 mg via ORAL
  Filled 2015-12-02 (×5): qty 1

## 2015-12-02 MED ORDER — KETOROLAC TROMETHAMINE 10 MG PO TABS
ORAL_TABLET | ORAL | Status: AC
  Filled 2015-12-02: qty 1

## 2015-12-02 NOTE — Progress Notes (Signed)
[facility] MD Progress Note  12/02/2015 4:28 PM Lee [name]  MRN:  [id number] Subjective: Patient reports " I am okay today"  Patient reports arthritics pain that been ongoing for the past 3 weeks. Patient report that Ultram is not helping states that pain 9/10 in his ankles and fingers"    Objective: [name] is awake, alert and oriented X3 , Per sing language interpreter Viviann Spare ) from [employer]. Patient found attending group session.  Denies suicidal or homicidal ideation. Denies auditory or visual hallucination and does not appear to be responding to internal stimuli. Patient reports he is medication compliant without mediation side effects. Patient reports that is arthritis is becoming painful after a recent fall. States his depression 5/10. Reports good appetite other wise and resting well. Patient reports that he is preparing for discharge for Tuesday and is ready to start a new chapter. Support, encouragement and reassurance was provided.   Principal Problem: Severe recurrent major depression without psychotic features (HCC) Diagnosis:   Patient Active Problem List   Diagnosis Date Noted  . Pain in joint, ankle and foot [M25.579]   . Adjustment disorder with mixed anxiety and depressed mood [F43.23]   . Alcohol dependence with uncomplicated withdrawal (HCC) [F10.230]   . Alcohol dependence (HCC) [F10.20] 11/15/2015  . Severe recurrent major depression without psychotic features (HCC) [F33.2] 11/15/2015   Total Time spent with patient: 15 minutes  Past Psychiatric History: see admission H and CP  Past Medical History:  Past Medical History  Diagnosis Date  . Deaf     Past Surgical History  Procedure Laterality Date  . Neck surgery     Family History: History reviewed. No pertinent family history. Family Psychiatric  History: see admission H and CP Social History:  History  Alcohol Use  . 3.6 oz/week  . 6 Cans of beer per week     History  Drug Use  No    Social History   Social History  . Marital Status: Divorced    Spouse Name: N/A  . Number of Children: N/A  . Years of Education: N/A   Social History Main Topics  . Smoking status: Current Every Day Smoker  . Smokeless tobacco: None  . Alcohol Use: 3.6 oz/week    6 Cans of beer per week  . Drug Use: No  . Sexual Activity: Not Asked   Other Topics Concern  . None   Social History Narrative   Additional Social History:    Pain Medications: Given Ibuprofen at ED Prescriptions: see PTA meds Over the Counter: denies History of alcohol / drug use?: Yes Longest period of sobriety (when/how long): pt reports being in rehab in 1999 Negative Consequences of Use: Legal, Personal relationships, Financial Withdrawal Symptoms: Agitation, Seizures, Tremors, Diarrhea Name of Substance 1: Alcohol 1 - Age of First Use: conflicting reports "1999" to one staff, reports to Clinical research associate "2014" 1 - Amount (size/oz): 3 "tall boys" a day 1 - Frequency: daily 1 - Duration: ongoing 1 - Last Use / Amount: 11/13/15                  Sleep: Fair  Appetite:  Fair  Current Medications: Current Facility-Administered Medications  Medication Dose Route Frequency Provider Last Rate Last Dose  . acetaminophen (TYLENOL) tablet 650 mg  650 mg Oral Q6H PRN Rachael Fee, MD   650 mg at 11/19/15 2139  . alum & mag hydroxide-simeth (MAALOX/MYLANTA) 200-200-20 MG/5ML suspension 30 mL  30  mL Oral Q4H PRN Rachael FeeIrving A Lugo, MD      . citalopram (CELEXA) tablet 40 mg  40 mg Oral Daily Rachael FeeIrving A Lugo, MD   40 mg at 12/02/15 0816  . feeding supplement (ENSURE ENLIVE) (ENSURE ENLIVE) liquid 237 mL  237 mL Oral TID BM Rachael FeeIrving A Lugo, MD   237 mL at 12/02/15 1336  . hydrOXYzine (ATARAX/VISTARIL) tablet 25 mg  25 mg Oral Q6H PRN Thermon LeylandLaura A Davis, NP   25 mg at 11/27/15 2124  . ibuprofen (ADVIL,MOTRIN) tablet 600 mg  600 mg Oral Q4H PRN Rachael FeeIrving A Lugo, MD   600 mg at 12/01/15 [id number]  . ketorolac (TORADOL) 10 MG tablet            . ketorolac (TORADOL) tablet 10 mg  10 mg Oral BID Oneta Rackanika N Lewis, NP   10 mg at 12/02/15 1336  . loperamide (IMODIUM) capsule 2 mg  2 mg Oral PRN Rachael FeeIrving A Lugo, MD   2 mg at 11/15/15 1431  . magnesium hydroxide (MILK OF MAGNESIA) suspension 30 mL  30 mL Oral Daily PRN Rachael FeeIrving A Lugo, MD      . multivitamin with minerals tablet 1 tablet  1 tablet Oral Daily Rachael FeeIrving A Lugo, MD   1 tablet at 12/02/15 0816  . nicotine (NICODERM CQ - dosed in mg/24 hours) patch 21 mg  21 mg Transdermal Daily Rachael FeeIrving A Lugo, MD   21 mg at 12/02/15 0815  . thiamine (VITAMIN B-1) tablet 100 mg  100 mg Oral Daily Rachael FeeIrving A Lugo, MD   100 mg at 12/02/15 0816  . traZODone (DESYREL) tablet 100 mg  100 mg Oral QHS PRN Rachael FeeIrving A Lugo, MD   100 mg at 12/01/15 2235    Lab Results: No results found for this or any previous visit (from the past 48 hour(s)).  Blood Alcohol level:  Lab Results  Component Value Date   [facility] 11/14/2015    Physical Findings: AIMS: Facial and Oral Movements Muscles of Facial Expression: None, normal Lips and Perioral Area: None, normal Jaw: None, normal Tongue: None, normal,Extremity Movements Upper (arms, wrists, hands, fingers): None, normal Lower (legs, knees, ankles, toes): None, normal, Trunk Movements Neck, shoulders, hips: None, normal, Overall Severity Severity of abnormal movements (highest score from questions above): None, normal Incapacitation due to abnormal movements: None, normal Patient's awareness of abnormal movements (rate only patient's report): No Awareness, Dental Status Current problems with teeth and/or dentures?: No Does patient usually wear dentures?: No  CI WA:  CIWA-Ar Total: 0 COWS:  COWS Total Score: 0  Musculoskeletal: Strength & Muscle Tone: within normal limits Gait & Station: normal Patient leans: normal  Psychiatric Specialty Exam: Review of Systems  Constitutional: Negative.   HENT: Negative.   Eyes: Negative.   Respiratory: Negative.    Cardiovascular: Negative.   Gastrointestinal: Negative.   Genitourinary: Negative.   Musculoskeletal: Positive for myalgias and joint pain.       Patient reports hx of arthritis and joint pain.  Skin: Negative.   Neurological: Negative.   Endo/Heme/Allergies: Negative.   Psychiatric/Behavioral: Positive for depression and substance abuse. The patient is nervous/anxious.   All other systems reviewed and are negative.   Blood pressure 104/73, pulse 57, temperature 97.5 F (36.4 C), temperature source Oral, resp. rate 16, height 5\' 9"  (1.753 m), weight 81.647 kg (180 lb), SpO2 98 %.Body mass index is 26.57 kg/(m^2).  General Appearance: Fairly Groomed , sign interpreters  Patent attorneyye Contact::  Fair  Speech:  sign  Volume:  sign  Mood:  Anxious and worried  Affect:  anxious worried  Thought Process:  Coherent and Goal Directed  Orientation:  Full (Time, Place, and Person)  Thought Content:  symptoms events worries concerns  Suicidal Thoughts:  No  Homicidal Thoughts:  No  Memory:  Immediate;   Fair Recent;   Fair Remote;   Fair  Judgement:  Fair  Insight:  Present  Psychomotor Activity:  Restlessness  Concentration:  Fair  Recall:  [employer] of Knowledge:Fair  Language: Fair  Akathisia:  No  Handed:  Right  AIMS (if indicated):     Assets:  Desire for Improvement Social Support  AL's:  Intact  Cognition: WNL  Sleep:  Number of Hours: 6.25    I agree with current treatment plan on 12/02/2015, Patient seen face-to-face for psychiatric evaluation follow-up, chart reviewed. Reviewed the information documented and agree with the treatment plan.  Treatment Plan Summary: Daily contact with patient to assess and evaluate symptoms and progress in treatment and Medication management  Supportive approach/coping skills Alcohol dependence;continue to work a relapse prevention plan Depression; continue the Celexa 40 mg daily  Insomnia: continue the Trazodone 100 mg HS  Pain:Discontinue  Ultram 50 mg  Continue /Motrin 600 mg PO PRN for mild pain  Start Etolac 10 mg PO BID for arthritis/ Pain 5 days for  Facilitate admission to [facility] on Tuesday  Oneta Rack, NP 12/02/2015, 4:28 PM  Agree with NP Progress Note, as above  Nehemiah Massed, MD

## 2015-12-02 NOTE — Progress Notes (Signed)
D) Pt has attended the groups. Pt participates and shares his thoughts and experiences while in the groups, with his peers. Affect is brighter, mood is less depressed. Pt states he is feeling better overall. Will laugh out loud appropriately with interaction with peers and with staff. Pt rates his depression at a 6, hopelessness at a 4 and his anxiety at a 5. Denies SI and HI. A) Given support, reassurance and praise. Provided with a 1:1 with Pt's interpreter. Praised. Able to interact and communicate with Pt. With simple hand spelling and writing. R) Denies SI and HI. Pleasant.

## 2015-12-02 NOTE — Plan of Care (Signed)
Problem: Alteration in mood Goal: LTG-Pt's behavior demonstrates decreased signs of depression (Patient's behavior demonstrates decreased signs of depression to the point the patient is safe to return home and continue treatment in an outpatient setting)  Outcome: Progressing Client reports decreased signs of depression AEB seen on hall, in dayroom interacts with interpretor with staff, reports "I been here two weeks working with the team, learning a lot" "medication helps me stable out, because I was really depressed" Client reports decreased depression.

## 2015-12-02 NOTE — [facility] Group Notes (Signed)
[facility] Group Notes:  (Clinical Social Work)   07/29/2015     10:00-11:00AM  Summary of Progress/Problems:   Today's process group explored in depth the perceived benefits and costs of alcohol and drugs, with an emphasis on the commonality among patients that alcohol/drugs was helping them to escape their pain and/or self-medicate their feelings.  We then were able to talk about ways to handle those issues instead, as well as the fact that addiction is lifelong and a lifelong replacement(s) has to be implemented in order to avoid relapse.  The patient expressed that he never drank before the deaths of his two children in 2014 one month apart.  He is going to [facility] soon, and was glad to hear from another patient about what to expect from rehab.  He knows that he has been self-medicating/dealing with his depression and grief with alcohol.  He participated heavily, said he has never really talked about things before being in the hospital, has always kept things bottled up.  Type of Therapy:  Group Therapy - Process   Participation Level:  Active  Participation Quality:  Attentive, Sharing and Supportive  Affect:  Blunted  Cognitive:  Appropriate and Oriented  Insight:  Engaged  Engagement in Therapy:  Engaged  Modes of Intervention:  Education, Motivational Interviewing  Ambrose MantleMareida Grossman-Orr, LCSW 12/02/2015, 10:56 AM

## 2015-12-02 NOTE — Progress Notes (Signed)
Patient did attend the evening speaker AA meeting.  

## 2015-12-02 NOTE — Progress Notes (Signed)
Patient has been up and active on the unit, attended group this evening with interpreter present. Patient  Is currently reporting  - si/hi/a/v hall. Support and encouragement offered, safety maintained on unit, will continue to monitor.

## 2015-12-03 MED ORDER — KETOROLAC TROMETHAMINE 10 MG PO TABS
10.0000 mg | ORAL_TABLET | Freq: Two times a day (BID) | ORAL | Status: AC
  Administered 2015-12-03 – 2015-12-05 (×4): 10 mg via ORAL
  Filled 2015-12-03 (×4): qty 1

## 2015-12-03 MED ORDER — IBUPROFEN 600 MG PO TABS
600.0000 mg | ORAL_TABLET | ORAL | Status: DC | PRN
  Filled 2015-12-03: qty 1

## 2015-12-03 MED ORDER — KETOROLAC TROMETHAMINE 10 MG PO TABS: 10.0000 mg | ORAL_TABLET | Freq: Two times a day (BID) | ORAL | Status: DC

## 2015-12-03 NOTE — Plan of Care (Signed)
Problem: Alteration in mood & ability to function due to Goal: STG-Patient will attend groups Outcome: Progressing Patient attended AA meeting tonight.     

## 2015-12-03 NOTE — Progress Notes (Signed)
[facility]  12/03/2015 1:34 PM Lee PlanRobert [name]  MRN:  [id number]   Subjective: Patient reports " I am okay today"  Patient reports arthritics pain is improving with the medication from yesterday.   Objective: Lee [name] is awake, alert and oriented X3 , Per sing language interpreter Viviann Spare(Michael King ) from [employer]. Patient found sitting in the day room with peers.  Denies suicidal or homicidal ideation. Denies auditory or visual hallucination and does not appear to be responding to internal stimuli. Patient reports he is medication compliant without mediation side effects.  States his depression 5/10. Reports good appetite and resting well. Patient reports that he is preparing for discharge for Tuesday and is ready to discharge. Support, encouragement and reassurance was provided.   Principal Problem: Severe recurrent major depression without psychotic features (HCC) Diagnosis:   Patient Active Problem List   Diagnosis Date Noted  . Pain in joint, ankle and foot [M25.579]   . Adjustment disorder with mixed anxiety and depressed mood [F43.23]   . Alcohol dependence with uncomplicated withdrawal (HCC) [F10.230]   . Alcohol dependence (HCC) [F10.20] 11/15/2015  . Severe recurrent major depression without psychotic features (HCC) [F33.2] 11/15/2015   Total Time spent with patient: 15 minutes  Past Psychiatric History: see admission H and CP  Past Medical History:  Past Medical History  Diagnosis Date  . Deaf     Past Surgical History  Procedure Laterality Date  . Neck surgery     Family History: History reviewed. No pertinent family history. Family Psychiatric  History: see admission H and CP Social History:  History  Alcohol Use  . 3.6 oz/week  . 6 Cans of beer per week     History  Drug Use No    Social History   Social History  . Marital Status: Divorced    Spouse Name: N/A  . Number of Children: N/A  . Years of Education: N/A   Social  History Main Topics  . Smoking status: Current Every Day Smoker  . Smokeless tobacco: None  . Alcohol Use: 3.6 oz/week    6 Cans of beer per week  . Drug Use: No  . Sexual Activity: Not Asked   Other Topics Concern  . None   Social History Narrative   Additional Social History:    Pain Medications: Given Ibuprofen at ED Prescriptions: see PTA meds Over the Counter: denies History of alcohol / drug use?: Yes Longest period of sobriety (when/how long): pt reports being in rehab in 1999 Negative Consequences of Use: Legal, Personal relationships, Financial Withdrawal Symptoms: Agitation, Seizures, Tremors, Diarrhea Name of Substance 1: Alcohol 1 - Age of First Use: conflicting reports "1999" to one staff, reports to Clinical research associatewriter "2014" 1 - Amount (size/oz): 3 "tall boys" a day 1 - Frequency: daily 1 - Duration: ongoing 1 - Last Use / Amount: 11/13/15                  Sleep: Fair  Appetite:  Fair  Current Medications: Current Facility-Administered Medications  Medication Dose Route Frequency Provider Last Rate Last Dose  . acetaminophen (TYLENOL) tablet 650 mg  650 mg Oral Q6H PRN Rachael FeeIrving A Lugo, MD   650 mg at 11/19/15 2139  . alum & mag hydroxide-simeth (MAALOX/MYLANTA) 200-200-20 MG/5ML suspension 30 mL  30 mL Oral Q4H PRN Rachael FeeIrving A Lugo, MD      . citalopram (CELEXA) tablet 40 mg  40 mg Oral Daily Rachael FeeIrving A Lugo, MD  40 mg at 12/03/15 0802  . feeding supplement (ENSURE ENLIVE) (ENSURE ENLIVE) liquid 237 mL  237 mL Oral TID BM Rachael Fee, MD   237 mL at 12/03/15 0803  . hydrOXYzine (ATARAX/VISTARIL) tablet 25 mg  25 mg Oral Q6H PRN Thermon Leyland, NP   25 mg at 11/27/15 2124  . ibuprofen (ADVIL,MOTRIN) tablet 600 mg  600 mg Oral Q4H PRN Rachael Fee, MD   600 mg at 12/02/15 2149  . ketorolac (TORADOL) tablet 10 mg  10 mg Oral BID Lee Rack, NP   10 mg at 12/03/15 0801  . loperamide (IMODIUM) capsule 2 mg  2 mg Oral PRN Rachael Fee, MD   2 mg at 11/15/15 1431  .  magnesium hydroxide (MILK OF MAGNESIA) suspension 30 mL  30 mL Oral Daily PRN Rachael Fee, MD      . multivitamin with minerals tablet 1 tablet  1 tablet Oral Daily Rachael Fee, MD   1 tablet at 12/03/15 0801  . nicotine (NICODERM CQ - dosed in mg/24 hours) patch 21 mg  21 mg Transdermal Daily Rachael Fee, MD   21 mg at 12/03/15 0800  . thiamine (VITAMIN B-1) tablet 100 mg  100 mg Oral Daily Rachael Fee, MD   100 mg at 12/03/15 0802  . traZODone (DESYREL) tablet 100 mg  100 mg Oral QHS PRN Rachael Fee, MD   100 mg at 12/02/15 2231    Lab Results: No results found for this or any previous visit (from the past 48 hour(s)).  Blood Alcohol level:  Lab Results  Component Value Date   [facility] 251* 11/14/2015    Physical Findings: AIMS: Facial and Oral Movements Muscles of Facial Expression: None, normal Lips and Perioral Area: None, normal Jaw: None, normal Tongue: None, normal,Extremity Movements Upper (arms, wrists, hands, fingers): None, normal Lower (legs, knees, ankles, toes): None, normal, Trunk Movements Neck, shoulders, hips: None, normal, Overall Severity Severity of abnormal movements (highest score from questions above): None, normal Incapacitation due to abnormal movements: None, normal Patient's awareness of abnormal movements (rate only patient's report): No Awareness, Dental Status Current problems with teeth and/or dentures?: No Does patient usually wear dentures?: No  CI WA:  CIWA-Ar Total: 0 COWS:  COWS Total Score: 0  Musculoskeletal: Strength & Muscle Tone: within normal limits Gait & Station: normal Patient leans: normal  Psychiatric Specialty Exam: Review of Systems  Constitutional: Negative.   HENT: Negative.   Eyes: Negative.   Respiratory: Negative.   Cardiovascular: Negative.   Gastrointestinal: Negative.   Genitourinary: Negative.   Musculoskeletal: Positive for myalgias and joint pain.       Patient reports hx of arthritis and joint pain.   Skin: Negative.   Neurological: Negative.   Endo/Heme/Allergies: Negative.   Psychiatric/Behavioral: Positive for depression and substance abuse. The patient is nervous/anxious.   All other systems reviewed and are negative.   Blood pressure 148/73, pulse 53, temperature 98 F (36.7 C), temperature source Oral, resp. rate 20, height  (1.753 m), weight 81.647 kg (180 lb), SpO2 98 %.Body mass index is 26.57 kg/(m^2).  General Appearance: Fairly Groomed , sign interpreters  Patent attorney::  Fair  Speech:  sign  Volume:  sign  Mood:  Anxious and worried  Affect:  anxious worried  Thought Process:  Coherent and Goal Directed  Orientation:  Full (Time, Place, and Person)  Thought Content:  symptoms events worries concerns  Suicidal Thoughts:  No  Homicidal Thoughts:  No  Memory:  Immediate;   Fair Recent;   Fair Remote;   Fair  Judgement:  Fair  Insight:  Present  Psychomotor Activity:  Restlessness  Concentration:  Fair  Recall:  [employer] of Knowledge:Fair  Language: Fair  Akathisia:  No  Handed:  Right  AIMS (if indicated):     Assets:  Desire for Improvement Social Support  AL's:  Intact  Cognition: WNL  Sleep:  Number of Hours: 5.75    I agree with current treatment [name] on 12/03/2015, Patient seen face-to-face for psychiatric evaluation follow-up, chart reviewed. Reviewed the information documented and agree with the treatment [name].  Treatment [name] Summary: Daily contact with patient to assess and evaluate symptoms and progress in treatment and Medication management  Information provided for Rheumatology  Supportive approach/coping skills Alcohol dependence;continue to work a relapse prevention [name] Depression; continue the Celexa 40 mg daily  Insomnia: continue the Trazodone 100 mg HS  Pain:Discontinue Ultram 50 mg  Continue /Motrin 600 mg PO PRN for mild pain  Continue Etolac 10 mg PO BID for arthritis/ Pain 3 days for- improving  Facilitate admission to  [facility] on Tuesday  Lee Rack, NP 12/03/2015, 1:34 PM  Agree with NP Progress Note, as above  Nehemiah Massed, MD

## 2015-12-03 NOTE — Progress Notes (Signed)
Patient has been up and active on the unit, attended group with interpreter present. Patient c/o right pointer finger pain and received motrin. Patient was concerned because his girlfriend had called and left a message that their was an emergency.We called while his interpreter was here but got no answer and left a message for return call Support and encouragement offered, safety maintained on unit, will continue to monitor.

## 2015-12-03 NOTE — [facility] Group Notes (Signed)
[facility] Group Notes:  (Clinical Social Work)  12/03/2015  10:00-11:00AM  Summary of Progress/Problems:   The main focus of today's process group was to   1)  discuss the importance of adding supports  2)  define health supports versus unhealthy supports  3)  identify the patient's current unhealthy supports and plan how to handle them  4)  Identify the patient's current healthy supports and plan what to add.  An emphasis was placed on using counselor, doctor, therapy groups, 12-step groups, and problem-specific support groups to expand supports.    The patient expressed full comprehension of the concepts presented, and agreed that there is a need to add more supports.  The patient stated his family and peers within the hospital for the last 17 days are his healthy supports.  He stated that he knows the social worker is setting up doctors for him, and he needs that in order to pursue sobriety and wellness.  He and the group were very jocular and interactive through the interpreter.  Type of Therapy:  Process Group with Motivational Interviewing  Participation Level:  Active  Participation Quality:  Attentive, Sharing and Supportive  Affect:  Appropriate  Cognitive:  Appropriate  Insight:  Engaged  Engagement in Therapy:  Engaged  Modes of Intervention:   Education, Support and Processing, Activity  Ambrose MantleMareida Grossman-Orr, LCSW 12/03/2015

## 2015-12-03 NOTE — Progress Notes (Signed)
D) Pt is attending the groups and speaking up in them via his interpreter. Stays on topic and shows some insight. Verbalizes that he is not feeling angry and feels ready to be discharged. Affect is brighter with interaction. Rates his depression at a 6, hopelessness at a 4 and his anxiety at a 6.  A) Given support, reassurance and praise. Encouragement provided. Provided with a 1:1. States he feels a lot of support from the group of Patients that share his hall. R) Pt denies SI and HI.

## 2015-12-04 MED ORDER — NICOTINE 21 MG/24HR TD PT24: 21.0000 mg | MEDICATED_PATCH | Freq: Every day | TRANSDERMAL | Status: DC

## 2015-12-04 MED ORDER — CITALOPRAM HYDROBROMIDE 40 MG PO TABS: 40.0000 mg | ORAL_TABLET | Freq: Every day | ORAL | Status: DC

## 2015-12-04 MED ORDER — KETOROLAC TROMETHAMINE 10 MG PO TABS: 10.0000 mg | ORAL_TABLET | Freq: Two times a day (BID) | ORAL | Status: DC

## 2015-12-04 MED ORDER — HYDROXYZINE HCL 25 MG PO TABS: 25.0000 mg | ORAL_TABLET | Freq: Four times a day (QID) | ORAL | Status: DC | PRN

## 2015-12-04 MED ORDER — TRAZODONE HCL 100 MG PO TABS: 100.0000 mg | ORAL_TABLET | Freq: Every evening | ORAL | Status: DC | PRN

## 2015-12-04 MED ORDER — OXYCODONE-ACETAMINOPHEN 5-325 MG PO TABS: 1.0000 | ORAL_TABLET | Freq: Four times a day (QID) | ORAL | Status: DC | PRN

## 2015-12-04 NOTE — Progress Notes (Signed)
D:Lee [name] in the hallway on approach.  Lee [name] and Clinical research associatewriter communicated through sign language interpreter. Lee [name] state he had a good day today.  Lee [name] states he is being discharged tomorrow.  Lee [name] states he is ready to go. Lee [name] state she has been having pain to his right index finger but states it is better. Lee [name] denies SI/HI and denies AVH.   A: Staff to monitor Q 15 mins for safety.  Encouragement and support offered.  Scheduled medications administered per orders.  Trazodone administered prn for sleep. R: Lee [name] remains safe on the unit.  Lee [name] attended group tonight.  Lee [name] visible on the unit.  Lee [name] taking administered medications.

## 2015-12-04 NOTE — Discharge Summary (Signed)
Physician Discharge Summary Note  Patient:  Lee [name] is an 60 y.o., male MRN:  [id number] DOB:  06-19-1956 Patient phone:  [phone] (home)  Patient address:   [address] [place] [address],  Total Time spent with patient: 30 minutes  Date of Admission:  11/15/2015 Date of Discharge: 12/05/2015  Reason for Admission:  depression  Principal Problem: Severe recurrent major depression without psychotic features [facility]) Discharge Diagnoses: Patient Active Problem List   Diagnosis Date Noted  . Pain in joint, ankle and foot [M25.579]   . Adjustment disorder with mixed anxiety and depressed mood [F43.23]   . Alcohol dependence with uncomplicated withdrawal (HCC) [F10.230]   . Alcohol dependence (HCC) [F10.20] 11/15/2015  . Severe recurrent major depression without psychotic features (HCC) [F33.2] 11/15/2015    Past Psychiatric History:  See above noted  Past Medical History:  Past Medical History  Diagnosis Date  . Deaf     Past Surgical History  Procedure Laterality Date  . Neck surgery     Family History: History reviewed. No pertinent family history. Family Psychiatric  History:  See above noted Social History:  History  Alcohol Use  . 3.6 oz/week  . 6 Cans of beer per week     History  Drug Use No    Social History   Social History  . Marital Status: Divorced    Spouse Name: N/A  . Number of Children: N/A  . Years of Education: N/A   Social History Main Topics  . Smoking status: Current Every Day Smoker  . Smokeless tobacco: None  . Alcohol Use: 3.6 oz/week    6 Cans of beer per week  . Drug Use: No  . Sexual Activity: Not Asked   Other Topics Concern  . None   Social History Narrative    Hospital Course:  TORBEN SOLOWAY was admitted for Severe recurrent major depression without psychotic features (HCC) and crisis management.  He was treated with the following medications listed below.  Enzo Montgomery Mohiuddin was discharged with current  medication and was instructed on how to take medications as prescribed; (details listed below under Medication List).  Medical problems were identified and treated as needed.  Home medications were restarted as appropriate.  Improvement was monitored by observation and [name] daily report of symptom reduction.  Emotional and mental status was monitored by daily self-inventory reports completed by [name] and clinical staff.         Enzo Montgomery Artiga was evaluated by the treatment team for stability and plans for continued recovery upon discharge.  Enzo Montgomery Salmons motivation was an integral factor for scheduling further treatment.  Employment, transportation, bed availability, health status, family support, and any pending legal issues were also considered during his hospital stay.  He was offered further treatment options upon discharge including but not limited to Residential, Intensive Outpatient, and Outpatient treatment.  Enzo Montgomery Thoreson will follow up with the services as listed below under Follow Up Information.     Upon completion of this admission the [name] was both mentally and medically stable for discharge denying suicidal/homicidal ideation, auditory/visual/tactile hallucinations, delusional thoughts and paranoia.     Physical Findings: AIMS: Facial and Oral Movements Muscles of Facial Expression: None, normal Lips and Perioral Area: None, normal Jaw: None, normal Tongue: None, normal,Extremity Movements Upper (arms, wrists, hands, fingers): None, normal Lower (legs, knees, ankles, toes): None, normal, Trunk Movements Neck, shoulders, hips: None,  normal, Overall Severity Severity of abnormal movements (highest score from questions above): None, normal Incapacitation due to abnormal movements: None, normal Patient's awareness of abnormal movements (rate only patient's report): No Awareness, Dental Status Current problems with teeth and/or dentures?: No Does  patient usually wear dentures?: No  CIWA:  CIWA-Ar Total: 0 COWS:  COWS Total Score: 0  Musculoskeletal: Strength & Muscle Tone: within normal limits Gait & Station: normal Patient leans: N/A  Psychiatric Specialty Exam:  SEE MD SRA Review of Systems  All other systems reviewed and are negative.   Blood pressure 136/88, pulse 60, temperature 98.6 F (37 C), temperature source Oral, resp. rate 14, height  (1.753 m), weight 81.647 kg (180 lb), SpO2 98 %.Body mass index is 26.57 kg/(m^2).  Have you used any form of tobacco in the last 30 days? (Cigarettes, Smokeless Tobacco, Cigars, and/or Pipes): Yes  Has this patient used any form of tobacco in the last 30 days? (Cigarettes, Smokeless Tobacco, Cigars, and/or Pipes) Yes, N/A  Blood Alcohol level:  Lab Results  Component Value Date   ETH 251* 11/14/2015    Metabolic Disorder Labs:  No results found for: HGBA1C, MPG No results found for: PROLACTIN No results found for: CHOL, TRIG, HDL, CHOLHDL, VLDL, LDLCALC  See Psychiatric Specialty Exam and Suicide Risk Assessment completed by Attending Physician prior to discharge.  Discharge destination:  Home  Is patient on multiple antipsychotic therapies at discharge:  No   Has Patient had three or more failed trials of antipsychotic monotherapy by history:  No  Recommended Plan for Multiple Antipsychotic Therapies: NA     Medication List    STOP taking these medications        ciprofloxacin 500 MG tablet  Commonly known as:  CIPRO     traMADol 50 MG tablet  Commonly known as:  ULTRAM     XANAX PO      TAKE these medications      Indication   citalopram 40 MG tablet  Commonly known as:  CELEXA  Take 1 tablet (40 mg total) by mouth daily.   Indication:  Depression     hydrOXYzine 25 MG tablet  Commonly known as:  ATARAX/VISTARIL  Take 1 tablet (25 mg total) by mouth every 6 (six) hours as needed (anxiety).   Indication:  Anxiety Neurosis, Itching due to  Histamine, Sedation     ketorolac 10 MG tablet  Commonly known as:  TORADOL  Take 1 tablet (10 mg total) by mouth 2 (two) times daily.   Indication:  Moderate to Severe Acute Pain     nicotine 21 mg/24hr patch  Commonly known as:  NICODERM CQ - dosed in mg/24 hours  Place 1 patch (21 mg total) onto the skin daily.   Indication:  Nicotine Addiction     oxyCODONE-acetaminophen 5-325 MG tablet  Commonly known as:  PERCOCET  Take 1-2 tablets by mouth every 6 (six) hours as needed for severe pain.   Indication:  Pain     traZODone 100 MG tablet  Commonly known as:  DESYREL  Take 1 tablet (100 mg total) by mouth at bedtime as needed for sleep.   Indication:  Trouble Sleeping       Follow-up Information    Follow up with [facility] On 12/05/2015.   Why:  Go today for contined treatment.   Contact information:   [address] [place], [place] [address] Phone: [phone] Fax: [phone]      Follow-up recommendations:  Activity:  as tol Diet:  as tol  Comments:  1.  Take all your medications as prescribed.              2.  Report any adverse side effects to outpatient provider.                       3.  Patient instructed to not use alcohol or illegal drugs while on prescription medicines.            4.  In the event of worsening symptoms, instructed patient to call 911, the crisis hotline or go to nearest emergency room for evaluation of symptoms.  Signed: Lindwood QuaSheila May Agustin, NP [facility] 12/05/2015, 11:34 AM  Patient seen, Suicide Assessment Completed.  Disposition Plan Reviewed

## 2015-12-04 NOTE — Progress Notes (Signed)
Recreation Therapy Notes  Date: 03.06.2017 Time: 9:30am Location: [address] Group Room   Group Topic: Stress Management  Goal Area(s) Addresses:  Patient will actively participate in stress management techniques presented during session.   Behavioral Response: Appropriate, Engaged   Intervention: Stress management techniques  Activity :  Deep Breathing, Mindfulness and Mindful Breathing. LRT provided education, instruction and demonstration on practice of Deep Breathing, Mindfulness and Mindful Breathing. Patient was asked to participate in technique introduced during session.   Education:  Stress Management, Discharge Planning.   Education Outcome: Acknowledges education  Clinical Observations/Feedback: Patient actively engaged in technique introduced, expressed no concerns and demonstrated ability to practice independently post d/c.   Marykay Lexenise L Mylin Gignac, LRT/CTRS  Jearl KlinefelterBlanchfield, Kashena Novitski L 12/04/2015 2:32 PM

## 2015-12-04 NOTE — Progress Notes (Signed)
CSW left voicemail for patient's girlfriend Debarah CrapeClaudia [phone] to update on patient's discharge plans. Awaiting return call.  Samuella BruinKristin Zailyn Rowser, LCSW Clinical Social Worker [facility] [phone]

## 2015-12-04 NOTE — Progress Notes (Signed)
Patient ID: Melene PlanRobert C Coletti, male   DOB: 1955-12-20, 60 y.o.   MRN: [id number] PER STATE REGULATIONS 482.30  THIS CHART WAS REVIEWED FOR MEDICAL NECESSITY WITH RESPECT TO THE PATIENT'S ADMISSION/ DURATION OF STAY.  NEXT REVIEW DATE: 12/06/2015  Willa RoughJENNIFER JONES Teresea Donley, RN, BSN CASE MANAGER

## 2015-12-04 NOTE — Progress Notes (Signed)
D: Lee MaduroRobert has been out in milieu this a.m. He denied SI through an interpreter and on his self-inventory sheet. He verbalized some pain in his right index finger (6) and ankle (2), for which he received Toradol with relief. On his self-inventory sheet, he reported good sleep, good appetite, normal energy level, and good concentration. He rated depression 5, hopelessness 4, and anxiety 4.  A: Meds given as ordered. Q15 safety checks maintained. Support/encouragement offered. R: Pt remains free from harm and continues with treatment. Will continue to monitor for needs/safety.

## 2015-12-04 NOTE — [facility] Group Notes (Signed)
Pt attended AA meeting.  Lee [name], MHT 

## 2015-12-04 NOTE — [facility] Group Notes (Signed)
   [facility]  12/04/2015  8:45 AM   Participation Quality: Alert, Appropriate and Oriented  Mood/Affect: Appropriate  Depression Rating: 5  Anxiety Rating: 0  Thoughts of Suicide: Pt denies SI/HI  Will you contract for safety? Yes  Current AVH: Pt denies  Plan for Discharge/Comments: Pt attended discharge planning group and actively participated in group. CSW provided pt with today's workbook. Patient plans to go to [facility] for continued treatment.  Transportation Means: Pt reports access to transportation  Supports: No supports mentioned at this time  Samuella BruinKristin Ellakate Gonsalves, MSW, [employer] JohnsonLCSW Clinical Social Worker [facility] [phone]

## 2015-12-04 NOTE — [facility] Group Notes (Signed)
[facility] LCSW Group Therapy Note  Date/Time: 12/04/15 at 2:30pm  Type of Therapy and Topic:  Group Therapy:  Holding on to Grudges  Participation Level:    Description of Group:    In this group patients will be asked to explore and define a grudge.  Patients will be guided to discuss their thoughts, feelings, and behaviors as to why one holds on to grudges and reasons why people have grudges. Patients will process the impact grudges have on daily life and identify thoughts and feelings related to holding on to grudges. Facilitator will challenge patients to identify ways of letting go of grudges and the benefits once released.  Patients will be confronted to address why one struggles letting go of grudges. Lastly, patients will identify feelings and thoughts related to what life would look like without grudges.  This group will be process-oriented, with patients participating in exploration of their own experiences as well as giving and receiving support and challenge from other group members.  Therapeutic Goals: 1. Patient will identify specific grudges related to their personal life. 2. Patient will identify feelings, thoughts, and beliefs around grudges. 3. Patient will identify how one releases grudges appropriately. 4. Patient will identify situations where they could have let go of the grudge, but instead chose to hold on.  Summary of Patient Progress  Patient discussed his tendency to ruminate on past deaths in his life. He discussed the therapeutic benefits of letting go of grudges.    Therapeutic Modalities:   Cognitive Behavioral Therapy Solution Focused Therapy Motivational Interviewing Brief Therapy   Samuella BruinKristin Imri Lor, LCSW Clinical Social Worker [facility] [phone]

## 2015-12-04 NOTE — Progress Notes (Signed)
[facility] paperwork faxed to magistrate for transport. CSW confirmed with patient's arrival tomorrow with [facility] staff.  Samuella BruinKristin Rasheem Figiel, LCSW Clinical Social Worker [facility] [phone];

## 2015-12-04 NOTE — Progress Notes (Signed)
Patient did attend the evening speaker AA meeting.  

## 2015-12-05 DIAGNOSIS — T65294D Toxic effect of other tobacco and nicotine, undetermined, subsequent encounter: Secondary | ICD-10-CM | POA: Diagnosis not present

## 2015-12-05 DIAGNOSIS — F332 Major depressive disorder, recurrent severe without psychotic features: Secondary | ICD-10-CM | POA: Diagnosis not present

## 2015-12-05 DIAGNOSIS — F339 Major depressive disorder, recurrent, unspecified: Secondary | ICD-10-CM | POA: Diagnosis not present

## 2015-12-05 DIAGNOSIS — F17200 Nicotine dependence, unspecified, uncomplicated: Secondary | ICD-10-CM | POA: Diagnosis not present

## 2015-12-05 DIAGNOSIS — Y900 Blood alcohol level of less than 20 mg/100 ml: Secondary | ICD-10-CM | POA: Diagnosis not present

## 2015-12-05 DIAGNOSIS — T510X4D Toxic effect of ethanol, undetermined, subsequent encounter: Secondary | ICD-10-CM | POA: Diagnosis not present

## 2015-12-05 DIAGNOSIS — F1020 Alcohol dependence, uncomplicated: Secondary | ICD-10-CM | POA: Diagnosis not present

## 2015-12-05 DIAGNOSIS — M542 Cervicalgia: Secondary | ICD-10-CM | POA: Diagnosis not present

## 2015-12-05 NOTE — Progress Notes (Signed)
  [facility] Discharge [facility] :  Will you be returning to the same living situation after discharge:  No. Patient going to [facility]  At discharge, do you have transportation home?: Yes,  sheriff to transport Do you have the ability to pay for your medications: Yes,  patient will be provided with prescriptions  Release of information consent forms completed and in the chart;  Patient's signature needed at discharge.  Patient to Follow up at: Follow-up Information    Follow up with [facility] On 12/05/2015.   Why:  Go today for contined treatment.   Contact information:   [address] [place], [place] [address] Phone: [phone] Fax: [phone]      Next level of care provider has access to [facility] Link:no  Safety Planning and Suicide Prevention discussed: Yes,  with patient  Have you used any form of tobacco in the last 30 days? (Cigarettes, Smokeless Tobacco, Cigars, and/or Pipes): Yes  Has patient been referred to the Quitline?: Patient refused referral  Patient has been referred for addiction treatment: Yes  Anjelique Makar, [place]in L 12/05/2015, 9:05 AM

## 2015-12-05 NOTE — Progress Notes (Signed)
[facility] MD Progress Note  12/05/2015 8:57 AM EPIC TRIBBETT  MRN:  [id number]   Subjective: Patient with assistance of interpreter states that he is ok.  Although he still point to hi right index finger as tender and painful at times.  Objective: Lee [name] is awake, alert and oriented X3 , found in dayroom with sign language interpreter Viviann Spare ) from [employer]. Denies suicidal or homicidal ideation. Denies auditory or visual hallucination and does not appear to be responding to internal stimuli.  Patient reports he is medication compliant without mediation side effects.  States his depression 5/10. Reports good appetite and resting well.  Patient reports that he is preparing for discharge and this Np discussed his medications. Support, encouragement and reassurance was provided.   Principal Problem: Severe recurrent major depression without psychotic features (HCC) Diagnosis:   Patient Active Problem List   Diagnosis Date Noted  . Pain in joint, ankle and foot [M25.579]   . Adjustment disorder with mixed anxiety and depressed mood [F43.23]   . Alcohol dependence with uncomplicated withdrawal (HCC) [F10.230]   . Alcohol dependence (HCC) [F10.20] 11/15/2015  . Severe recurrent major depression without psychotic features (HCC) [F33.2] 11/15/2015   Total Time spent with patient: 15 minutes  Past Psychiatric History: see admission H and CP  Past Medical History:  Past Medical History  Diagnosis Date  . Deaf     Past Surgical History  Procedure Laterality Date  . Neck surgery     Family History: History reviewed. No pertinent family history. Family Psychiatric  History: see admission H and CP Social History:  History  Alcohol Use  . 3.6 oz/week  . 6 Cans of beer per week     History  Drug Use No    Social History   Social History  . Marital Status: Divorced    Spouse Name: N/A  . Number of Children: N/A  . Years of Education: N/A   Social History  Main Topics  . Smoking status: Current Every Day Smoker  . Smokeless tobacco: None  . Alcohol Use: 3.6 oz/week    6 Cans of beer per week  . Drug Use: No  . Sexual Activity: Not Asked   Other Topics Concern  . None   Social History Narrative   Additional Social History:    Pain Medications: Given Ibuprofen at ED Prescriptions: see PTA meds Over the Counter: denies History of alcohol / drug use?: Yes Longest period of sobriety (when/how long): pt reports being in rehab in 1999 Negative Consequences of Use: Legal, Personal relationships, Financial Withdrawal Symptoms: Agitation, Seizures, Tremors, Diarrhea Name of Substance 1: Alcohol 1 - Age of First Use: conflicting reports "1999" to one staff, reports to Clinical research associate "2014" 1 - Amount (size/oz): 3 "tall boys" a day 1 - Frequency: daily 1 - Duration: ongoing 1 - Last Use / Amount: 11/13/15  Sleep: Fair  Appetite:  Fair  Current Medications: Current Facility-Administered Medications  Medication Dose Route Frequency Provider Last Rate Last Dose  . acetaminophen (TYLENOL) tablet 650 mg  650 mg Oral Q6H PRN Rachael Fee, MD   650 mg at 12/03/15 1957  . alum & mag hydroxide-simeth (MAALOX/MYLANTA) 200-200-20 MG/5ML suspension 30 mL  30 mL Oral Q4H PRN Rachael Fee, MD      . citalopram (CELEXA) tablet 40 mg  40 mg Oral Daily Rachael Fee, MD   40 mg at 12/05/15 0755  . feeding supplement (ENSURE ENLIVE) (ENSURE ENLIVE) liquid  237 mL  237 mL Oral TID BM Rachael Fee, MD   237 mL at 12/04/15 2237  . hydrOXYzine (ATARAX/VISTARIL) tablet 25 mg  25 mg Oral Q6H PRN Thermon Leyland, NP   25 mg at 11/27/15 2124  . ibuprofen (ADVIL,MOTRIN) tablet 600 mg  600 mg Oral Q4H PRN Rachael Fee, MD   600 mg at 12/04/15 2127  . loperamide (IMODIUM) capsule 2 mg  2 mg Oral PRN Rachael Fee, MD   2 mg at 11/15/15 1431  . magnesium hydroxide (MILK OF MAGNESIA) suspension 30 mL  30 mL Oral Daily PRN Rachael Fee, MD      . multivitamin with minerals  tablet 1 tablet  1 tablet Oral Daily Rachael Fee, MD   1 tablet at 12/05/15 0755  . nicotine (NICODERM CQ - dosed in mg/24 hours) patch 21 mg  21 mg Transdermal Daily Rachael Fee, MD   21 mg at 12/05/15 0756  . thiamine (VITAMIN B-1) tablet 100 mg  100 mg Oral Daily Rachael Fee, MD   100 mg at 12/05/15 0755  . traZODone (DESYREL) tablet 100 mg  100 mg Oral QHS PRN Rachael Fee, MD   100 mg at 12/04/15 2235    Lab Results: No results found for this or any previous visit (from the past 48 hour(s)).  Blood Alcohol level:  Lab Results  Component Value Date   [facility] 251* 11/14/2015    Physical Findings: AIMS: Facial and Oral Movements Muscles of Facial Expression: None, normal Lips and Perioral Area: None, normal Jaw: None, normal Tongue: None, normal,Extremity Movements Upper (arms, wrists, hands, fingers): None, normal Lower (legs, knees, ankles, toes): None, normal, Trunk Movements Neck, shoulders, hips: None, normal, Overall Severity Severity of abnormal movements (highest score from questions above): None, normal Incapacitation due to abnormal movements: None, normal Patient's awareness of abnormal movements (rate only patient's report): No Awareness, Dental Status Current problems with teeth and/or dentures?: No Does patient usually wear dentures?: No  CI WA:  CIWA-Ar Total: 0 COWS:  COWS Total Score: 0  Musculoskeletal: Strength & Muscle Tone: within normal limits Gait & Station: normal Patient leans: normal  Psychiatric Specialty Exam: Review of Systems  Constitutional: Negative.   HENT: Negative.   Eyes: Negative.   Respiratory: Negative.   Cardiovascular: Negative.   Gastrointestinal: Negative.   Genitourinary: Negative.   Musculoskeletal: Positive for myalgias and joint pain.       Patient reports hx of arthritis and joint pain.  Skin: Negative.   Neurological: Negative.   Endo/Heme/Allergies: Negative.   Psychiatric/Behavioral: Positive for depression and  substance abuse. The patient is nervous/anxious.   All other systems reviewed and are negative.   Blood pressure 143/78, pulse 55, temperature 98.6 F (37 C), temperature source Oral, resp. rate 14, height  (1.753 m), weight 81.647 kg (180 lb), SpO2 98 %.Body mass index is 26.57 kg/(m^2).  General Appearance: Fairly Groomed , sign interpreters  Patent attorney::  Fair  Speech:  sign  Volume:  sign  Mood:  Anxious and worried  Affect:  anxious worried  Thought Process:  Coherent and Goal Directed  Orientation:  Full (Time, Place, and Person)  Thought Content:  symptoms events worries concerns  Suicidal Thoughts:  No  Homicidal Thoughts:  No  Memory:  Immediate;   Fair Recent;   Fair Remote;   Fair  Judgement:  Fair  Insight:  Present  Psychomotor Activity:  Restlessness  Concentration:  Fair  Recall:  Jennelle HumanFair  Fund of Knowledge:Fair  Language: Fair  Akathisia:  No  Handed:  Right  AIMS (if indicated):     Assets:  Desire for Improvement Social Support  AL's:  Intact  Cognition: WNL  Sleep:  Number of Hours: 6.5    I agree with current treatment [name] on 12/03/2015, Patient seen face-to-face for psychiatric evaluation follow-up, chart reviewed. Reviewed the information documented and agree with the treatment [name].  Treatment [name] Summary: Daily contact with patient to assess and evaluate symptoms and progress in treatment and Medication management  Information provided for Rheumatology  Supportive approach/coping skills Alcohol dependence;continue to work a relapse prevention [name] Depression; continue the Celexa 40 mg daily  Insomnia: continue the Trazodone 100 mg HS   Continue /Motrin 600 mg PO PRN for mild pain  Continue Etolac 10 mg PO BID for arthritis/ Pain 3 days for- improving  Facilitate admission to [facility] on Tuesday  Sheila May Agustin, NP [facility] 12/05/2015, 8:57 AM   Agree with NP Progress Note, as above  Nehemiah MassedFernando Cobos, MD

## 2015-12-05 NOTE — Tx Team (Signed)
Interdisciplinary Treatment Plan Update (Adult) Date: 12/05/2015    Time Reviewed: 9:30 AM  Progress in Treatment: Attending groups: Yes Participating in groups: Yes Taking medication as prescribed: Yes Tolerating medication: Yes Family/Significant other contact made: Yes, CSW has spoken with girlfriend Patient understands diagnosis: Yes Discussing patient identified problems/goals with staff: Yes Medical problems stabilized or resolved: Yes Denies suicidal/homicidal ideation: Yes Issues/concerns per patient self-inventory: Yes Other:  New problem(s) identified: N/A  Discharge Plan or Barriers: Patient plans to discharge to [place] for continued treatment.   Reason for Continuation of Hospitalization:  Depression Anxiety Medication Stabilization   Comments: N/A  Estimated length of stay: Discharge anticipated for today 12/05/15    Patient is a 60 year old male with a diagnosis of substance induced mood disorder, Major Depressive Disorder, and Alcohol Use Disorder. Pt presented to the hospital with suicidal ideations and alcohol abuse. Pt reports primary trigger(s) for admission were housing and relationship stressors, as well as experiencing grief. Patient will benefit from crisis stabilization, medication evaluation, group therapy and psycho education in addition to case management for discharge planning. At discharge, it is recommended that Pt remain compliant with established discharge plan and continued treatment.   Review of initial/current patient goals per problem list:  1. Goal(s): Patient will participate in aftercare plan   Met: Yes   Target date: 3-5 days post admission date   As evidenced by: Patient will participate within aftercare plan AEB aftercare provider and housing plan at discharge being identified.  2/16: Goal progressing. Patient interested in residential treatment.  2/21: [facility] referral pending.   2/24: [facility] referral pending. Patient not eligible  for [facility] or [facility] due to pending legal charges.   3/1: [facility] referral pending   3/7: Goal met. Patient plans to discharge to [place] for continued treatment.    2. Goal (s): Patient will exhibit decreased depressive symptoms and suicidal ideations.   Met: Adequate for discharge per MD   Target date: 3-5 days post admission date   As evidenced by: Patient will utilize self rating of depression at 3 or below and demonstrate decreased signs of depression or be deemed stable for discharge by MD.   2/16: Goal progressing. Patient continues to endorse depression but is attending groups.  2/20: Patient rates depression at 8, denies SI.  2/24: Goal progressing. Patient rates depression at 6, denies SI.  3/1: PT denies SI/HI/AVH and rates depression as 5/10 today.   3/7: Adequate for discharge. Patient rates depression at 5, denies SI.    3. Goal(s): Patient will demonstrate decreased signs of withdrawal due to substance abuse   Met: Yes   Target date: 3-5 days post admission date   As evidenced by: Patient will produce a CIWA/COWS score of 0, have stable vitals signs, and no symptoms of withdrawal  2/16: Goal not met: Pt continues to have withdrawal symptoms of headache, anxiety, and tremor and a CIWA score of a 3.  Pt to show decrease withdrawal symptoms prior to d/c.  2/21: Goal met. No withdrawal symptoms reported at this time per medical chart.    Attendees: Patient:    Family:    Physician:  Dr. Parke Poisson, Dr. Shea Evans  12/05/2015 9:00 AM   Nursing: Elayne Snare, RN 12/05/2015 9:00 AM  Clinical Social Worker: Erasmo Downer Nettie Cromwell, LCSW 12/05/2015 9:00 AM   Other: Peri Maris, LCSWA; Daphne, LCSW  12/05/2015 9:00 AM   Other:    Other: Lars Pinks, Case Manager 12/05/2015 9:00  AM   Other: Ricky Ala, May Augustin NP 12/05/2015 9:00 AM   Other:    Other:    Other:      Scribe for Treatment Team:  Tilden Fossa, [place] Worker [facility] [phone]

## 2015-12-05 NOTE — [facility] Suicide Risk Assessment (Signed)
[facility] Discharge Suicide Risk Assessment   Principal Problem: Severe recurrent major depression without psychotic features [facility]) Discharge Diagnoses:  Patient Active Problem List   Diagnosis Date Noted  . Pain in joint, ankle and foot [M25.579]   . Adjustment disorder with mixed anxiety and depressed mood [F43.23]   . Alcohol dependence with uncomplicated withdrawal (HCC) [F10.230]   . Alcohol dependence (HCC) [F10.20] 11/15/2015  . Severe recurrent major depression without psychotic features (HCC) [F33.2] 11/15/2015    Total Time spent with patient: 30 minutes  Musculoskeletal: Strength & Muscle Tone: within normal limits Gait & Station: normal Patient leans: N/A  Psychiatric Specialty Exam: ROS  Blood pressure 143/78, pulse 55, temperature 98.6 F (37 C), temperature source Oral, resp. rate 14, height  (1.753 m), weight 180 lb (81.647 kg), SpO2 98 %.Body mass index is 26.57 kg/(m^2).  General Appearance: Well Groomed  Patent attorney::  Good  Speech:  Normal Rate - of note, patient is hearing impaired and communication is via sign language interpreter   Volume:  not applicable   Mood:  reports he is feeling better, somewhat anxious   Affect:  Appropriate and reactive, smiles at times appropriately  Thought Process:  Linear  Orientation:  Other:  fully alert and attentive  Thought Content:  no hallucinations, no delusions, not internally preoccupied   Suicidal Thoughts:  No denies any suicidal ideations, denies any self injurious ideations   Homicidal Thoughts:  No  Memory:  recent and remote grossly intact   Judgement:  Other:  present  Insight:  Present  Psychomotor Activity:  Normal- no tremors, no diaphoresis, no restlessness , vitals stable   Concentration:  Good  Recall:  Good  Fund of Knowledge:Good  Language: Good  Akathisia:  Negative  Handed:  Right  AIMS (if indicated):     Assets:  Desire for Improvement Resilience  Sleep:  Number of Hours: 6.5  Cognition:  WNL  ADL's:  Intact   Mental Status Per Nursing Assessment::   On Admission:     Demographic Factors:  60 year old male , has been homeless, has GF, he is on disability  Loss Factors: Reports two of his children passed away 2013-02-18.   Historical Factors: History of depression, history of alcohol dependence   Risk Reduction Factors:   Positive coping skills or problem solving skills  Continued Clinical Symptoms:  At this time patient is alert, attentive, well related, describes mood as improved, affect appropriate, some anxiety, no thought disorder, no SI or HI, no psychotic symptoms, no ongoing symptoms of alcohol WDL , denies medication side effects. Future oriented, and looking forward to discharge and going to [facility]  States he is anxious as he has been unable to reach his GF via phone- at his request/ with his consent I called , but no answer so left message requesting call back   Cognitive Features That Contribute To Risk:  No gross cognitive deficits noted upon discharge. Is alert , attentive, and oriented x 3   Suicide Risk:  Mild:  Suicidal ideation of limited frequency, intensity, duration, and specificity.  There are no identifiable plans, no associated intent, mild dysphoria and related symptoms, good self-control (both objective and subjective assessment), few other risk factors, and identifiable protective factors, including available and accessible social support.  Follow-up Information    Follow up with [facility] On 12/05/2015.   Why:  Go today for contined treatment.   Contact information:   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

## 2015-12-05 NOTE — Progress Notes (Signed)
D: Pt D/C to [facility] as per MD's order. Pt was picked up by the sheriff. Pt was pleasant, presents with congruent mood and affect and was cooperative with d/c procedure. Pt took his medications as ordered when offered. Denies SI, HI, AVH and pain when assessed via interpreter. Pt verbalized understanding. Pt signed belonging sheet in agreement with items received.  A: All medications administered as per [facility]. Support, encouragement and availability provided to pt. D/C instructions including prescriptions and follow up care reviewed with pt via interpreter. All belongings in locker 37 returned to pt at time of d/c. Q15 minutes checks maintained for safety on and off unit without self injurious behavior or outburst. R: Pt remained safe on and off unit till time of d/c. No physical distress evident.

## 2015-12-22 ENCOUNTER — Ambulatory Visit (HOSPITAL_COMMUNITY): Payer: Self-pay | Admitting: Psychology

## 2015-12-26 ENCOUNTER — Telehealth (HOSPITAL_COMMUNITY): Payer: Self-pay | Admitting: Psychology

## 2016-01-01 ENCOUNTER — Telehealth (HOSPITAL_COMMUNITY): Payer: Self-pay | Admitting: Psychology

## 2016-01-04 ENCOUNTER — Other Ambulatory Visit: Payer: Self-pay

## 2016-01-04 NOTE — Patient Outreach (Signed)
[facility]) Care Management  01/04/2016  Lee PlanRobert [name] Jan 18, 1956 [id number]   Telephone call to patient regarding [facility] referral. Person answering phone states patient is not available. HIPAA compliant voice message left with call back phone number   [name]:  RNCM will attempt 2nd telephone call to patient within 1 week.   Lee InaDavina Quanell Loughney RN,BSN,CCM [facility] Telephonic  [phone]

## 2016-01-11 ENCOUNTER — Other Ambulatory Visit: Payer: Self-pay

## 2016-01-11 NOTE — Patient Outreach (Signed)
[facility]) Care Management  01/11/2016  Lee PlanRobert [name] 1955/11/01 [id number]   Second telephone call to patient regarding [facility] referral.  HIPAA compliant message left with video/interpretor service with interpreter [phone].  Message left with call back phone number.   [name]:  RNCM will attempt 3rd telephone outreach to patient within  1 week.   [name] RN,BSN,CCM [facility] Telephonic  [phone]

## 2016-01-12 ENCOUNTER — Ambulatory Visit: Payer: Self-pay

## 2016-01-15 ENCOUNTER — Other Ambulatory Visit: Payer: Self-pay

## 2016-01-15 NOTE — Patient Outreach (Addendum)
[facility]) Care Management  01/15/2016  Melene PlanRobert C Kernodle 05-15-1956 [id number]   Third telephone call to patient regarding [facility] referral. Attempted call to listed phone number. Interpreting service stated caller not available.  HIPAA compliant voice message left with call back phone number. Attempted alternate phone number.  Unable to reach patient. HIPAA compliant voice message left with  Call back phone number.,   PLAN:  RNCM will send patient outreach letter to attempt contact.  George InaDavina Madelein Mahadeo RN,BSN,CCM [facility] Telephonic  [phone]

## 2016-01-31 ENCOUNTER — Other Ambulatory Visit: Payer: Self-pay

## 2016-01-31 NOTE — Patient Outreach (Signed)
[facility]) Care Management  01/31/2016  Lee [name] [birth year] [id number]   No response from patient after 3 telephone attempts and outreach letter.   PLAN;  RNCM will refer patient to Sherle Poeicole Robinson to close due to inability to reach patient.  RNCM notify listed primary MD of closure.   George InaDavina Olander Friedl RN,BSN,CCM [facility] Telephonic  [phone]

## 2016-03-22 IMAGING — CR DG LUMBAR SPINE COMPLETE 4+V
5 series · 5 of 5 positions shown · non-contrast
Comparison: Lumbar radiographs October 21, 2011; lumbar CT July 29, 2014 ; lumbar MRI August 16, 2014

CLINICAL DATA: Three-month history of low back pain with radicular
symptoms bilaterally

EXAM:
LUMBAR SPINE - COMPLETE 4+ VIEW

[t l-spine a.p.]
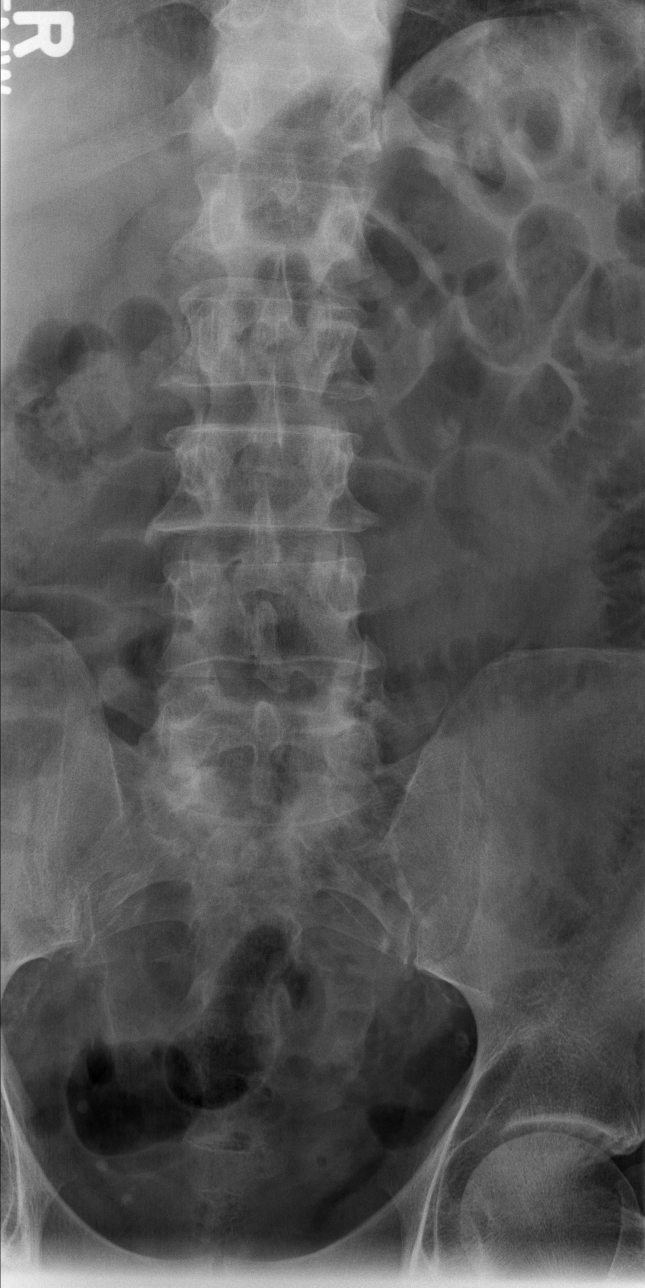

[t l-spine oblique exposure (1 of 2)]
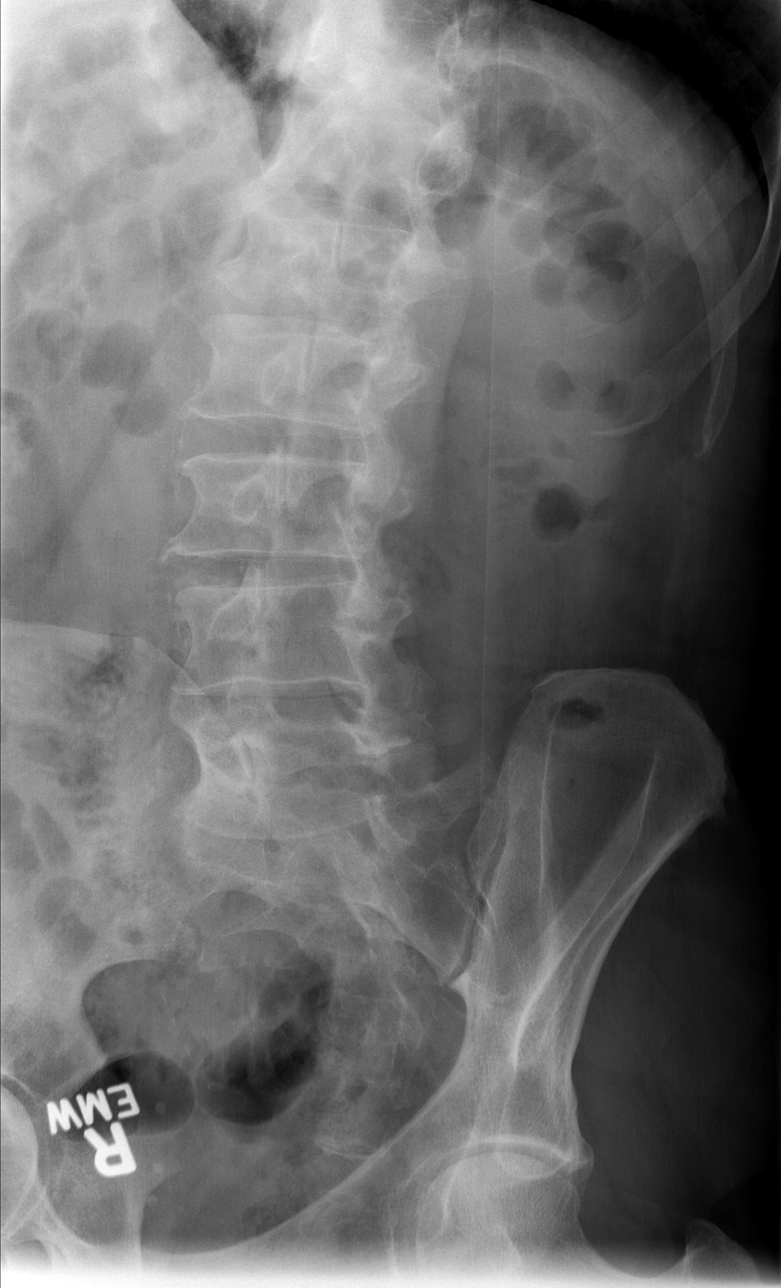

[t l-spine oblique exposure (2 of 2)]
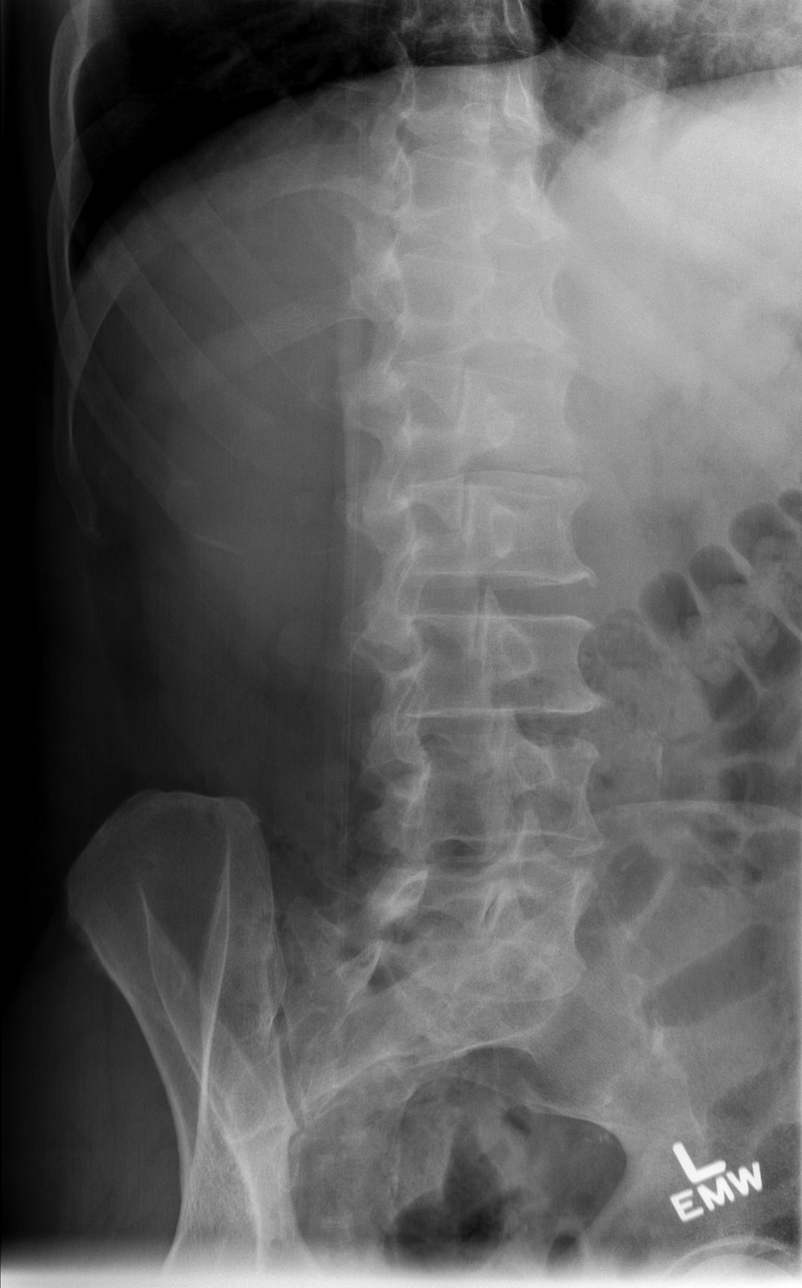

[t l-spine lat]
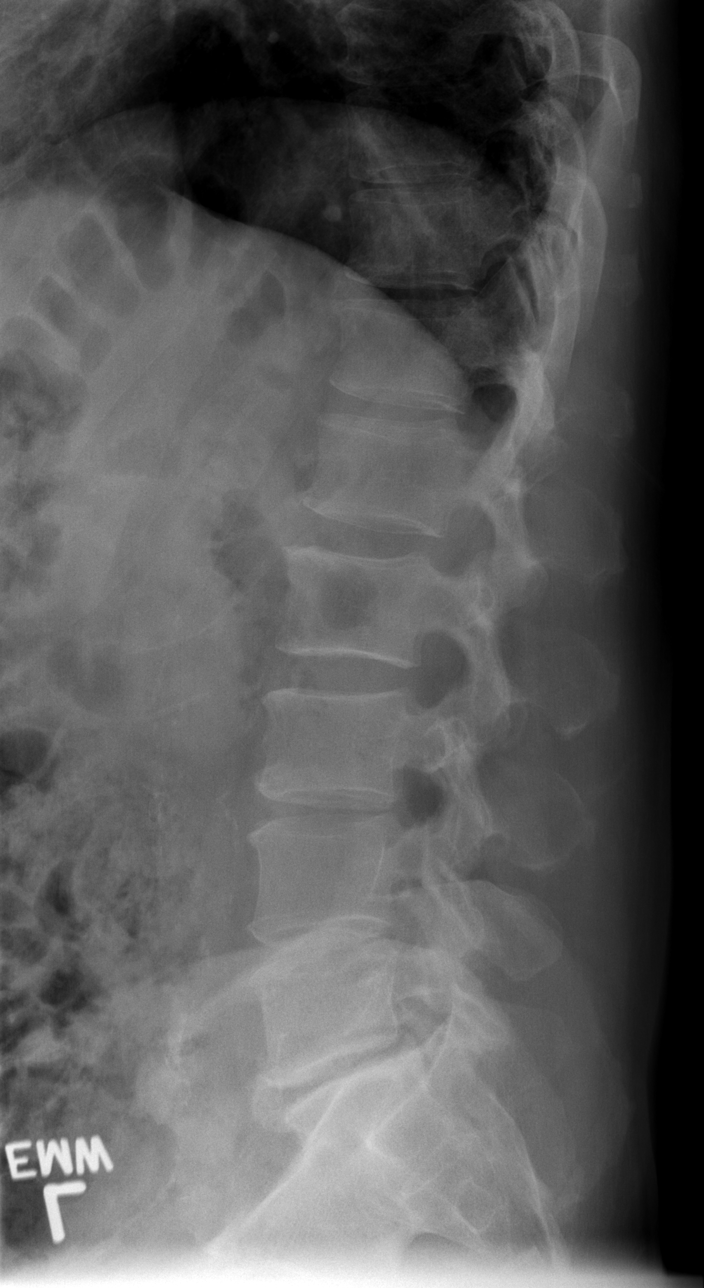

[t l-spine l5-s1 spot]
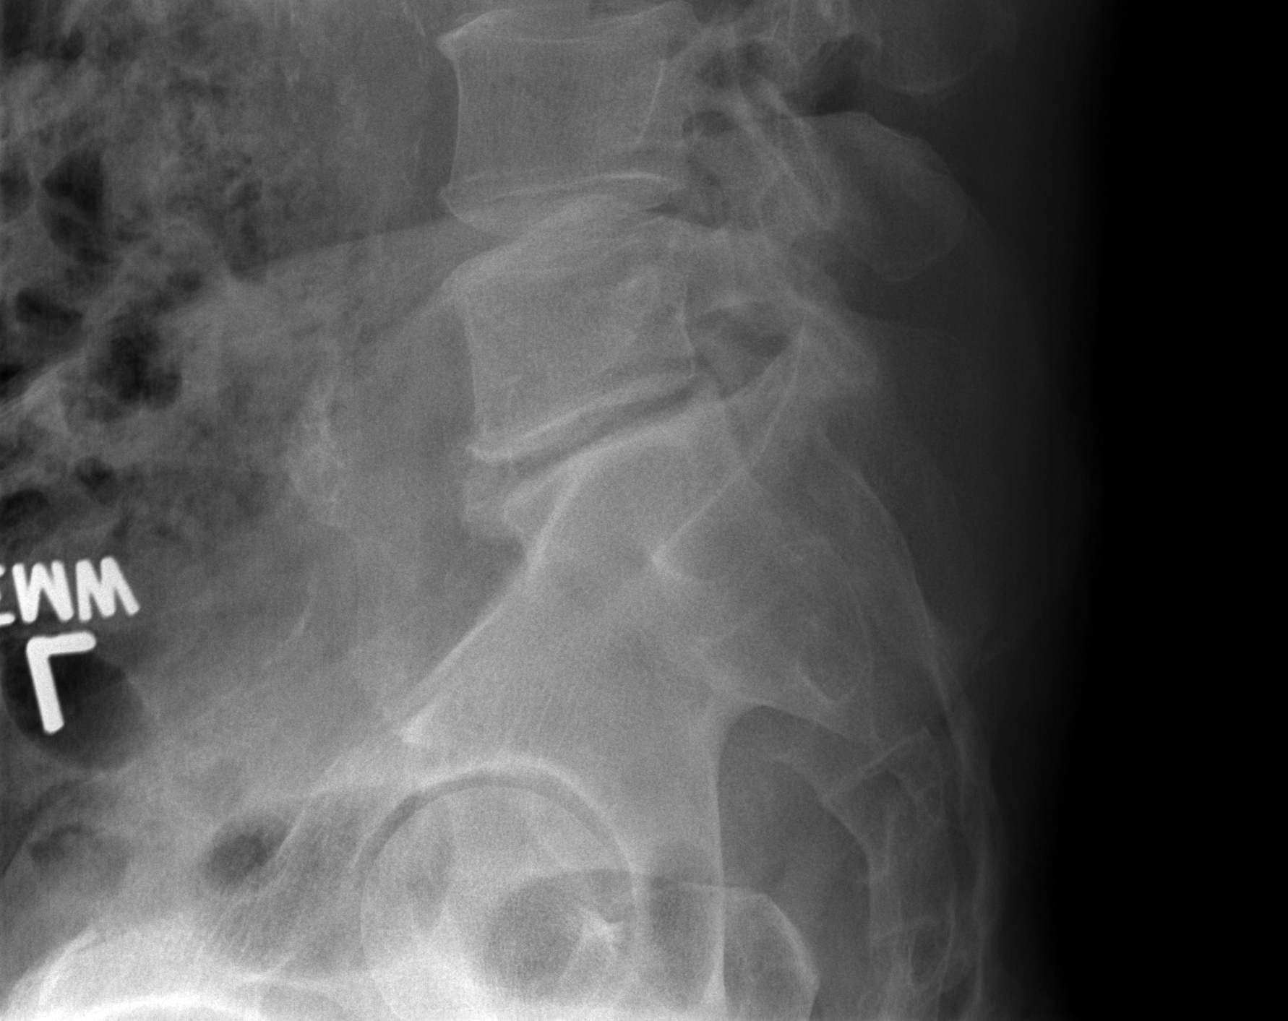

[5 of 5 positions shown; findings below may reference images not displayed]

FINDINGS: Frontal, lateral, spot lumbosacral lateral, and bilateral oblique
views were obtained. There are 5 non-rib-bearing lumbar type
vertebral bodies. There is no fracture or spondylolisthesis. There
is moderate disc space narrowing at L3-4, L4-5, and L5-S1, increased
from prior studies. There is facet osteoarthritic change at L5-S1
bilaterally.
IMPRESSION: Osteoarthritic change with slight progression from prior studies. No
fracture or spondylolisthesis.

## 2016-05-21 ENCOUNTER — Encounter (HOSPITAL_COMMUNITY): Payer: Self-pay | Admitting: Emergency Medicine

## 2016-05-21 ENCOUNTER — Emergency Department (HOSPITAL_COMMUNITY)
Admission: EM | Admit: 2016-05-21 | Discharge: 2016-05-21 | Disposition: A | Discharge status: Home / Self Care | Payer: PPO | Attending: Emergency Medicine | Admitting: Emergency Medicine

## 2016-05-21 ENCOUNTER — Emergency Department (HOSPITAL_COMMUNITY): Payer: PPO

## 2016-05-21 DIAGNOSIS — F17210 Nicotine dependence, cigarettes, uncomplicated: Secondary | ICD-10-CM | POA: Diagnosis not present

## 2016-05-21 DIAGNOSIS — R109 Unspecified abdominal pain: Secondary | ICD-10-CM | POA: Insufficient documentation

## 2016-05-21 DIAGNOSIS — Z791 Long term (current) use of non-steroidal anti-inflammatories (NSAID): Secondary | ICD-10-CM | POA: Diagnosis not present

## 2016-05-21 LAB — CBC WITH DIFFERENTIAL/PLATELET
Basophils Absolute: 0 10*3/uL (ref 0.0–0.1)
Basophils Relative: 0 %
Eosinophils Absolute: 0.1 10*3/uL (ref 0.0–0.7)
Eosinophils Relative: 1 %
HEMATOCRIT: 41.9 % (ref 39.0–52.0)
HEMOGLOBIN: 14.1 g/dL (ref 13.0–17.0)
LYMPHS ABS: 2.2 10*3/uL (ref 0.7–4.0)
Lymphocytes Relative: 28 %
MCH: 35.2 pg — AB (ref 26.0–34.0)
MCHC: 33.7 g/dL (ref 30.0–36.0)
MCV: 104.5 fL — ABNORMAL HIGH (ref 78.0–100.0)
MONOS PCT: 11 %
Monocytes Absolute: 0.8 10*3/uL (ref 0.1–1.0)
NEUTROS ABS: 4.7 10*3/uL (ref 1.7–7.7)
NEUTROS PCT: 60 %
Platelets: 173 10*3/uL (ref 150–400)
RBC: 4.01 MIL/uL — AB (ref 4.22–5.81)
RDW: 13.6 % (ref 11.5–15.5)
WBC: 7.7 10*3/uL (ref 4.0–10.5)

## 2016-05-21 LAB — URINALYSIS, ROUTINE W REFLEX MICROSCOPIC
Bilirubin Urine: NEGATIVE
GLUCOSE, UA: NEGATIVE mg/dL
HGB URINE DIPSTICK: NEGATIVE
KETONES UR: NEGATIVE mg/dL
LEUKOCYTES UA: NEGATIVE
Nitrite: NEGATIVE
PROTEIN: NEGATIVE mg/dL
Specific Gravity, Urine: 1.01 (ref 1.005–1.030)
pH: 5.5 (ref 5.0–8.0)

## 2016-05-21 LAB — BASIC METABOLIC PANEL
Anion gap: 9 (ref 5–15)
BUN: 13 mg/dL (ref 6–20)
CHLORIDE: 109 mmol/L (ref 101–111)
CO2: 23 mmol/L (ref 22–32)
CREATININE: 0.89 mg/dL (ref 0.61–1.24)
Calcium: 8.7 mg/dL — ABNORMAL LOW (ref 8.9–10.3)
GFR calc non Af Amer: >60 mL/min (ref >60)
Glucose, Bld: 91 mg/dL (ref 65–99)
POTASSIUM: 3.9 mmol/L (ref 3.5–5.1)
SODIUM: 141 mmol/L (ref 135–145)

## 2016-05-21 MED ORDER — HYDROMORPHONE HCL 1 MG/ML IJ SOLN
0.5000 mg | Freq: Once | INTRAMUSCULAR | Status: AC
Start: 2016-05-21 — End: 2016-05-21
  Administered 2016-05-21: 0.5 mg via INTRAVENOUS
  Filled 2016-05-21: qty 1

## 2016-05-21 MED ORDER — KETOROLAC TROMETHAMINE 30 MG/ML IJ SOLN
30.0000 mg | Freq: Once | INTRAMUSCULAR | Status: AC
  Administered 2016-05-21: 30 mg via INTRAVENOUS
  Filled 2016-05-21: qty 1

## 2016-05-21 MED ORDER — CYCLOBENZAPRINE HCL 10 MG PO TABS: 10.0000 mg | ORAL_TABLET | Freq: Two times a day (BID) | ORAL | 0 refills | Status: DC | PRN

## 2016-05-21 NOTE — ED Notes (Signed)
Bed: [id number] Expected date:  Expected time:  Means of arrival:  Comments: Left flank pain/urinary retention

## 2016-05-21 NOTE — Discharge Instructions (Signed)
Your abdominal aorta is 3.3 cm. Follow-up in 3 years for an ultrasound.

## 2016-05-21 NOTE — ED Provider Notes (Signed)
[facility]-EMERGENCY DEPT Provider Note   CSN: [id number] Arrival date & time: 05/21/16  4098     History   Chief Complaint Chief Complaint  Patient presents with  . Flank Pain    HPI Lee [name] is a 60 y.o. male.  The history is provided by the patient. The history is limited by a language barrier (Patient is deaf and sign language interpreter used). A language interpreter was used.  Patient presents with right flank pain. Began yesterday while he was walking to work. States pain is severe. Hurts to walk and can't find a comfortable position. No blood in the urine but states sometimes pain makes it so he can sleep. States he could not work and had to walk all that also is painful. States he did not sleep well last night. No fevers. No nausea vomiting. No change in his bowel movements. Reportedly was drinking 2 24 ounce beers along with BC powder to help with the pain. It did not really help.  Past Medical History:  Diagnosis Date  . Deaf     Patient Active Problem List   Diagnosis Date Noted  . Pain in joint, ankle and foot   . Adjustment disorder with mixed anxiety and depressed mood   . Alcohol dependence with uncomplicated withdrawal (HCC)   . Alcohol dependence (HCC) 11/15/2015  . 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 NECK SURGERY         Home Medications    Prior to Admission medications   Medication Sig Start Date End Date Taking? Authorizing Provider  citalopram (CELEXA) 40 MG tablet Take 1 tablet (40 mg total) by mouth daily. Patient not taking: Reported on 05/21/2016 12/04/15   Adonis Brook, NP  cyclobenzaprine (FLEXERIL) 10 MG tablet Take 1 tablet (10 mg total) by mouth 2 (two) times daily as needed for muscle spasms. 05/21/16   Benjiman Core, MD  hydrOXYzine (ATARAX/VISTARIL) 25 MG tablet Take 1 tablet (25 mg total) by mouth every 6 (six) hours as needed  (anxiety). Patient not taking: Reported on 05/21/2016 12/04/15   Adonis Brook, NP  ketorolac (TORADOL) 10 MG tablet Take 1 tablet (10 mg total) by mouth 2 (two) times daily. Patient not taking: Reported on 05/21/2016 12/04/15   Adonis Brook, NP  nicotine (NICODERM CQ - DOSED IN MG/24 HOURS) 21 mg/24hr patch Place 1 patch (21 mg total) onto the skin daily. Patient not taking: Reported on 05/21/2016 12/04/15   Adonis Brook, NP  oxyCODONE-acetaminophen (PERCOCET) 5-325 MG tablet Take 1-2 tablets by mouth every 6 (six) hours as needed for severe pain. Patient not taking: Reported on 05/21/2016 12/04/15   Adonis Brook, NP  traZODone (DESYREL) 100 MG tablet Take 1 tablet (100 mg total) by mouth at bedtime as needed for sleep. Patient not taking: Reported on 05/21/2016 12/04/15   Adonis Brook, NP    Family History No family history on file.  Social History Social History  Substance Use Topics  . Smoking status: Current Every Day Smoker    Packs/day: 1.00    Types: Cigarettes  . Smokeless tobacco: Never Used  . Alcohol use 3.6 oz/week    6 Cans of beer per week     Allergies   Review of patient's allergies indicates no known allergies.   Review of Systems Review of Systems  Constitutional: Negative for appetite change, diaphoresis and fever.  HENT: Negative for sore throat.   Respiratory:  Negative for cough and shortness of breath.   Cardiovascular: Negative for chest pain.  Gastrointestinal: Negative for abdominal pain.  Endocrine: Negative for polyuria.  Genitourinary: Positive for flank pain. Negative for difficulty urinating, dysuria and urgency.  Musculoskeletal: Negative for back pain.  Neurological: Negative for light-headedness.  Psychiatric/Behavioral: Negative for behavioral problems.     Physical Exam Updated Vital Signs BP 137/75 (BP Location: Right Arm)   Pulse 61   Temp 97.6 F (36.4 C) (Oral)   Resp 18   Ht 5\' 7"  (1.702 m)   Wt 170 lb (77.1 kg)   SpO2 97%    BMI 26.63 kg/m   Physical Exam  Constitutional: He appears well-developed.  HENT:  Head: Atraumatic.  Eyes: EOM are normal.  Neck: Neck supple.  Cardiovascular: Normal rate.   Pulmonary/Chest: Effort normal.  Abdominal: Soft.  Genitourinary:  Genitourinary Comments: Some CVA tenderness on right.  Musculoskeletal: Normal range of motion.  Neurological: He is alert.  Skin: No rash noted.  Psychiatric: He has a normal mood and affect.     ED Treatments / Results  Labs (all labs ordered are listed, but only abnormal results are displayed) Labs Reviewed  CBC WITH DIFFERENTIAL/PLATELET - Abnormal; Notable for the following:       Result Value   RBC 4.01 (*)    MCV 104.5 (*)    MCH 35.2 (*)    All other components within normal limits  BASIC METABOLIC PANEL - Abnormal; Notable for the following:    Calcium 8.7 (*)    All other components within normal limits  URINALYSIS, ROUTINE W REFLEX MICROSCOPIC (NOT [facility])    EKG  EKG Interpretation None       Radiology Ct Renal Stone Study  Result Date: 05/21/2016 CLINICAL DATA:  Right flank pain with difficulty urinating, recent onset. EXAM: CT ABDOMEN AND PELVIS WITHOUT CONTRAST TECHNIQUE: Multidetector CT imaging of the abdomen and pelvis was performed following the standard protocol without IV contrast. COMPARISON:  Lumbar radiographs 10/17/2014. FINDINGS: Lower chest: Mild scarring at the lung bases. No evidence of infiltrate, mass, effusion or collapse. Calcified granuloma in the right lower lobe with a calcified right lymph node. Hepatobiliary: Liver is normal without contrast. No calcified gallstones. Pancreas: Normal Spleen: Normal Adrenals/Urinary Tract: Adrenal glands are normal. Kidneys are normal. No cyst, mass, stone or hydronephrosis. Bladder appears normal. Stomach/Bowel: No evidence of obstruction, inflammatory process, or abnormal fluid collections. Appendix is normal. Vascular/Lymphatic: Aortic atherosclerosis. Mild  aneurysmal dilatation of the infrarenal abdominal aorta with maximal transverse diameter of 3.3 cm. Atherosclerosis of the aortic branch vessels. Reproductive: No mass or other significant abnormality. Prostate gland is not grossly enlarged. Other: None. Musculoskeletal: Chronic appearing degenerative disc disease and degenerative facet disease at L5-S1. IMPRESSION: No evidence of urinary tract stone disease. No cause of right flank pain or difficulty urinating identified. Aortic atherosclerosis. **An incidental finding of potential clinical significance has been found. Infrarenal aortic aneurysmal dilatation with maximal transverse diameter of 3.3 cm. Recommend followup by ultrasound in 3 years. This recommendation follows ACR consensus guidelines: White Paper of the ACR Incidental Findings Committee II on Vascular Findings. Earlyne Iba[employer] Am Coll Radiol 2013; 16:109-604; 10:789-794** Electronically Signed   By: Paulina FusiMark  Shogry M.D.   On: 05/21/2016 07:46    Procedures Procedures (including critical care time)  Medications Ordered in ED Medications  HYDROmorphone (DILAUDID) injection 0.5 mg (0.5 mg Intravenous Given 05/21/16 0734)  ketorolac (TORADOL) 30 MG/ML injection 30 mg (30 mg Intravenous Given 05/21/16 0918)  Initial Impression / Assessment and Plan / ED Course  I have reviewed the triage vital signs and the nursing notes.  Pertinent labs & imaging results that were available during my care of the patient were reviewed by me and considered in my medical decision making (see chart for details).  Clinical Course    Patient with flank pain. CT scan reassuring. Lab work reassuring. Potentially muscle skeletal. Informed of small AAA that will need follow-up. Unlikely the cause of this pain. We'll give muscle relaxers and have follow-up as needed.  Final Clinical Impressions(s) / ED Diagnoses   Final diagnoses:  Flank pain    New Prescriptions Discharge Medication List as of 05/21/2016  9:16 AM    START taking  these medications   Details  cyclobenzaprine (FLEXERIL) 10 MG tablet Take 1 tablet (10 mg total) by mouth 2 (two) times daily as needed for muscle spasms., Starting Tue 05/21/2016, Print         Benjiman CoreNathan Marlen Mollica, MD 05/21/16 [id number]

## 2016-05-21 NOTE — ED Triage Notes (Signed)
Per [employer] pt has been having right flank pain for the last 7 days and difficulty urinating. Pt reports drinking daily and [facility]. EMS established IV access and 50mcg Fentanyl given prior to arrival.

## 2016-05-22 DIAGNOSIS — Z1159 Encounter for screening for other viral diseases: Secondary | ICD-10-CM | POA: Diagnosis not present

## 2016-05-22 DIAGNOSIS — S29011A Strain of muscle and tendon of front wall of thorax, initial encounter: Secondary | ICD-10-CM | POA: Diagnosis not present

## 2016-05-22 DIAGNOSIS — Z23 Encounter for immunization: Secondary | ICD-10-CM | POA: Diagnosis not present

## 2016-05-22 DIAGNOSIS — F10230 Alcohol dependence with withdrawal, uncomplicated: Secondary | ICD-10-CM | POA: Diagnosis not present

## 2016-06-04 ENCOUNTER — Encounter (HOSPITAL_COMMUNITY): Payer: Self-pay

## 2016-06-04 ENCOUNTER — Emergency Department (HOSPITAL_COMMUNITY)
Admission: EM | Admit: 2016-06-04 | Discharge: 2016-06-04 | Disposition: A | Discharge status: Home / Self Care | Payer: PPO | Attending: Emergency Medicine | Admitting: Emergency Medicine

## 2016-06-04 ENCOUNTER — Emergency Department (HOSPITAL_COMMUNITY): Payer: PPO

## 2016-06-04 DIAGNOSIS — R1084 Generalized abdominal pain: Secondary | ICD-10-CM | POA: Insufficient documentation

## 2016-06-04 DIAGNOSIS — F17210 Nicotine dependence, cigarettes, uncomplicated: Secondary | ICD-10-CM | POA: Diagnosis not present

## 2016-06-04 DIAGNOSIS — R1011 Right upper quadrant pain: Secondary | ICD-10-CM

## 2016-06-04 DIAGNOSIS — M549 Dorsalgia, unspecified: Secondary | ICD-10-CM | POA: Diagnosis not present

## 2016-06-04 LAB — COMPREHENSIVE METABOLIC PANEL
ALBUMIN: 4.2 g/dL (ref 3.5–5.0)
ALK PHOS: 72 U/L (ref 38–126)
ALT: 20 U/L (ref 17–63)
ANION GAP: 10 (ref 5–15)
AST: 57 U/L — ABNORMAL HIGH (ref 15–41)
BUN: 9 mg/dL (ref 6–20)
CALCIUM: 9.3 mg/dL (ref 8.9–10.3)
CHLORIDE: 106 mmol/L (ref 101–111)
CO2: 24 mmol/L (ref 22–32)
Creatinine, Ser: 0.97 mg/dL (ref 0.61–1.24)
GFR calc non Af Amer: >60 mL/min (ref >60)
GLUCOSE: 92 mg/dL (ref 65–99)
Potassium: 4.4 mmol/L (ref 3.5–5.1)
SODIUM: 140 mmol/L (ref 135–145)
Total Bilirubin: 0.9 mg/dL (ref 0.3–1.2)
Total Protein: 7.3 g/dL (ref 6.5–8.1)

## 2016-06-04 LAB — CBC WITH DIFFERENTIAL/PLATELET
BASOS PCT: 1 %
Basophils Absolute: 0.1 10*3/uL (ref 0.0–0.1)
EOS ABS: 0.1 10*3/uL (ref 0.0–0.7)
EOS PCT: 2 %
HCT: 46.4 % (ref 39.0–52.0)
Hemoglobin: 15.5 g/dL (ref 13.0–17.0)
LYMPHS ABS: 2.2 10*3/uL (ref 0.7–4.0)
Lymphocytes Relative: 28 %
MCH: 34.8 pg — ABNORMAL HIGH (ref 26.0–34.0)
MCHC: 33.4 g/dL (ref 30.0–36.0)
MCV: 104.3 fL — ABNORMAL HIGH (ref 78.0–100.0)
MONO ABS: 0.9 10*3/uL (ref 0.1–1.0)
MONOS PCT: 11 %
Neutro Abs: 4.5 10*3/uL (ref 1.7–7.7)
Neutrophils Relative %: 58 %
PLATELETS: 210 10*3/uL (ref 150–400)
RBC: 4.45 MIL/uL (ref 4.22–5.81)
RDW: 12.8 % (ref 11.5–15.5)
WBC: 7.7 10*3/uL (ref 4.0–10.5)

## 2016-06-04 LAB — LIPASE, BLOOD: Lipase: 50 U/L (ref 11–51)

## 2016-06-04 LAB — MAGNESIUM: MAGNESIUM: 2.3 mg/dL (ref 1.7–2.4)

## 2016-06-04 MED ORDER — GI COCKTAIL ~~LOC~~
30.0000 mL | Freq: Once | ORAL | Status: AC
  Administered 2016-06-04: 30 mL via ORAL
  Filled 2016-06-04: qty 30

## 2016-06-04 MED ORDER — MORPHINE SULFATE (PF) 4 MG/ML IV SOLN
4.0000 mg | Freq: Once | INTRAVENOUS | Status: AC
  Administered 2016-06-04: 4 mg via INTRAVENOUS
  Filled 2016-06-04: qty 1

## 2016-06-04 MED ORDER — IBUPROFEN 800 MG PO TABS
800.0000 mg | ORAL_TABLET | Freq: Once | ORAL | Status: AC
  Administered 2016-06-04: 800 mg via ORAL
  Filled 2016-06-04: qty 1

## 2016-06-04 MED ORDER — ACETAMINOPHEN 500 MG PO TABS
1000.0000 mg | ORAL_TABLET | Freq: Once | ORAL | Status: AC
  Administered 2016-06-04: 1000 mg via ORAL
  Filled 2016-06-04: qty 2

## 2016-06-04 MED ORDER — SODIUM CHLORIDE 0.9 % IV BOLUS (SEPSIS)
1000.0000 mL | Freq: Once | INTRAVENOUS | Status: AC
  Administered 2016-06-04: 1000 mL via INTRAVENOUS

## 2016-06-04 NOTE — ED Notes (Addendum)
Pt assessed by ASL interpreter Marlena [id number].   Pt reports this pain has been ongoing x 3 months. Has been trying to relieve pain w/ alcohol. Denies n/v/d. States he was not given anything to take for pain after his last ED visit. Pt also states he fell at some point and heard a crack today. No bruising or obvious deformity noted.

## 2016-06-04 NOTE — Progress Notes (Signed)
CSW received ASL interpreter phone call from Patient's wife, [name] [phone]), regarding Patient being severely depressed and drinking a lot. CSW explained that due to HIPAA CSW could confirm or deny any information nor could CSW provide any information. Lee [name] reports understanding. Patient's wife reports wanting Patient to be psychiatrically assessed and given substance abuse resources as well as outpatient mental health resources/medication management at discharge. CSW has staffed this information with Patient's RN, Lee [name].          Lance MussAshley Gardner,MSW, LCSW [facility] Clinical Social Worker [phone]

## 2016-06-04 NOTE — Discharge Instructions (Signed)
Try zantac 150mg twice a day.  ° ° °

## 2016-06-04 NOTE — ED Notes (Signed)
[name] [phone] for explaining administration of meds

## 2016-06-04 NOTE — ED Notes (Signed)
Placed patient into a gown and on the monitor 

## 2016-06-04 NOTE — ED Triage Notes (Signed)
Per [facility] - pt found on ground by bystanders. [facility] dispatched to assess pt. No injury reported but placed on backboard b/c c/o back and abd pain. Pt drank 3 large beers today[facility]

## 2016-06-04 NOTE — ED Notes (Signed)
DC papers reviewed with sign language interpreter on the screen. Pt. sts that he still has weird cramps but will follow up with his doctor.

## 2016-06-04 NOTE — ED Notes (Signed)
During DC, pt. Refuses to leave and requests to speak with a doctor

## 2016-06-04 NOTE — ED Provider Notes (Signed)
[facility]-EMERGENCY DEPT Provider Note   CSN: [id number] Arrival date & time: 06/04/16  1326     History   Chief Complaint Chief Complaint  Patient presents with  . Abdominal Pain    HPI DEARIES MEIKLE is a 60 y.o. male.  60 yo M with a chief complaint of right sided abdominal pain. This been going on for the past weeks. Was seen in the emergency department with a CT scan that was negative for acute pathology but there is a new finding of a 3.3 cm AAA. Since then patient has continued to have diffuse cramping which he says goes across his entire body as well as abdomen. He had one episode that was so severe that he dropped to the ground. States it comes and goes. Unsure what makes it better or worse. Denies fevers or chills denies vomiting or diarrhea.   The history is provided by the patient. The history is limited by a language barrier. A language interpreter was used.  Abdominal Pain   This is a chronic problem. The current episode started more than 1 week ago. The problem occurs constantly. The problem has not changed since onset.The pain is associated with eating and alcohol use. The pain is located in the RUQ. The pain is at a severity of 8/10. The pain is moderate. Pertinent negatives include fever, diarrhea, vomiting, headaches, arthralgias and myalgias. The symptoms are aggravated by certain positions and drinking alcohol. Nothing relieves the symptoms.    Past Medical History:  Diagnosis Date  . Deaf     Patient Active Problem List   Diagnosis Date Noted  . Pain in joint, ankle and foot   . Adjustment disorder with mixed anxiety and depressed mood   . Alcohol dependence with uncomplicated withdrawal (HCC)   . Alcohol dependence (HCC) 11/15/2015  . 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 NECK SURGERY         Home Medications    Prior to Admission medications     Medication Sig Start Date End Date Taking? Authorizing Provider  citalopram (CELEXA) 40 MG tablet Take 1 tablet (40 mg total) by mouth daily. Patient not taking: Reported on 05/21/2016 12/04/15   Adonis Brook, NP  cyclobenzaprine (FLEXERIL) 10 MG tablet Take 1 tablet (10 mg total) by mouth 2 (two) times daily as needed for muscle spasms. 05/21/16   Benjiman Core, MD  hydrOXYzine (ATARAX/VISTARIL) 25 MG tablet Take 1 tablet (25 mg total) by mouth every 6 (six) hours as needed (anxiety). Patient not taking: Reported on 05/21/2016 12/04/15   Adonis Brook, NP  ketorolac (TORADOL) 10 MG tablet Take 1 tablet (10 mg total) by mouth 2 (two) times daily. Patient not taking: Reported on 05/21/2016 12/04/15   Adonis Brook, NP  nicotine (NICODERM CQ - DOSED IN MG/24 HOURS) 21 mg/24hr patch Place 1 patch (21 mg total) onto the skin daily. Patient not taking: Reported on 05/21/2016 12/04/15   Adonis Brook, NP  oxyCODONE-acetaminophen (PERCOCET) 5-325 MG tablet Take 1-2 tablets by mouth every 6 (six) hours as needed for severe pain. Patient not taking: Reported on 05/21/2016 12/04/15   Adonis Brook, NP  traZODone (DESYREL) 100 MG tablet Take 1 tablet (100 mg total) by mouth at bedtime as needed for sleep. Patient not taking: Reported on 05/21/2016 12/04/15   Adonis Brook, NP    Family History History reviewed. No pertinent family history.  Social History Social History  Substance Use Topics  . Smoking status: Current Every Day Smoker    Packs/day: 1.00    Types: Cigarettes  . Smokeless tobacco: Never Used  . Alcohol use 3.6 oz/week    6 Cans of beer per week     Allergies   Review of patient's allergies indicates no known allergies.   Review of Systems Review of Systems  Constitutional: Negative for chills and fever.  HENT: Negative for congestion and facial swelling.   Eyes: Negative for discharge and visual disturbance.  Respiratory: Negative for shortness of breath.   Cardiovascular:  Negative for chest pain and palpitations.  Gastrointestinal: Positive for abdominal pain. Negative for diarrhea and vomiting.  Musculoskeletal: Negative for arthralgias and myalgias.  Skin: Negative for color change and rash.  Neurological: Negative for tremors, syncope and headaches.  Psychiatric/Behavioral: Negative for confusion and dysphoric mood.     Physical Exam Updated Vital Signs BP 158/78   Pulse 64   Temp 98.5 F (36.9 C)   Resp 16   SpO2 97%   Physical Exam  Constitutional: He is oriented to person, place, and time. He appears well-developed and well-nourished.  HENT:  Head: Normocephalic and atraumatic.  Eyes: Conjunctivae and EOM are normal. Pupils are equal, round, and reactive to light.  Neck: Normal range of motion. No JVD present.  Cardiovascular: Normal rate and regular rhythm.   Pulmonary/Chest: Effort normal. No stridor. No respiratory distress.  Abdominal: He exhibits no distension. There is tenderness (RUQ + murphys). There is no guarding.  Musculoskeletal: Normal range of motion. He exhibits no edema.  Neurological: He is alert and oriented to person, place, and time.  Skin: Skin is warm and dry.  Psychiatric: He has a normal mood and affect. His behavior is normal.     ED Treatments / Results  Labs (all labs ordered are listed, but only abnormal results are displayed) Labs Reviewed  CBC WITH DIFFERENTIAL/PLATELET - Abnormal; Notable for the following:       Result Value   MCV 104.3 (*)    MCH 34.8 (*)    All other components within normal limits  COMPREHENSIVE METABOLIC PANEL - Abnormal; Notable for the following:    AST 57 (*)    All other components within normal limits  LIPASE, BLOOD  MAGNESIUM    EKG  EKG Interpretation None       Radiology US Abdomen Limited Ruq  Result Date: 06/04/2016 CLINICAL DATA:  Right upper quadrant abdominal pain for 2 months. EXAM: US ABDOMEN LIMITED - RIGHT UPPER QUADRANT COMPARISON:  None. FINDINGS:  Gallbladder: No gallstones or wall thickening visualized. No sonographic Murphy sign noted by sonographer. Common bile duct: Diameter: 4.1 mm which is within normal limits. Liver: No focal lesion identified. Within normal limits in parenchymal echogenicity. IMPRESSION: No abnormality seen in the right upper quadrant of the abdomen. Electronically Signed   By: Lupita Raider, M.D.   On: 06/04/2016 14:49    Procedures Procedures (including critical care time)  Medications Ordered in ED Medications  sodium chloride 0.9 % bolus 1,000 mL (0 mLs Intravenous Stopped 06/04/16 1530)  acetaminophen (TYLENOL) tablet 1,000 mg (1,000 mg Oral Given 06/04/16 1408)  ibuprofen (ADVIL,MOTRIN) tablet 800 mg (800 mg Oral Given 06/04/16 1408)  morphine 4 MG/ML injection 4 mg (4 mg Intravenous Given 06/04/16 1408)  gi cocktail (Maalox,Lidocaine,Donnatal) (30 mLs Oral Given 06/04/16 1410)     Initial Impression / Assessment and Plan / ED Course  I have reviewed the triage vital  signs and the nursing notes.  Pertinent labs & imaging results that were available during my care of the patient were reviewed by me and considered in my medical decision making (see chart for details).  Clinical Course    60 yo M With right upper quadrant abdominal pain. This been going on for the past 3 weeks or so. Patient has been seen here as well as by family doctor. Found to have no significant intra-abdominal pathology initially continues to have symptoms. Right upper quadrant ultrasound with no noted pathology. I feel these symptoms are atypical of AAA. PCP follow-up.  4:24 PM:  I have discussed the diagnosis/risks/treatment options with the patient and believe the pt to be eligible for discharge home to follow-up with PCP. We also discussed returning to the ED immediately if new or worsening sx occur. We discussed the sx which are most concerning (e.g., sudden worsening pain, fever, inability to tolerate by mouth) that necessitate  immediate return. Medications administered to the patient during their visit and any new prescriptions provided to the patient are listed below.  Medications given during this visit Medications  sodium chloride 0.9 % bolus 1,000 mL (0 mLs Intravenous Stopped 06/04/16 1530)  acetaminophen (TYLENOL) tablet 1,000 mg (1,000 mg Oral Given 06/04/16 1408)  ibuprofen (ADVIL,MOTRIN) tablet 800 mg (800 mg Oral Given 06/04/16 1408)  morphine 4 MG/ML injection 4 mg (4 mg Intravenous Given 06/04/16 1408)  gi cocktail (Maalox,Lidocaine,Donnatal) (30 mLs Oral Given 06/04/16 1410)     The patient appears reasonably screen and/or stabilized for discharge and I doubt any other medical condition or other [facility] requiring further screening, evaluation, or treatment in the ED at this time prior to discharge.    Final Clinical Impressions(s) / ED Diagnoses   Final diagnoses:  RUQ abdominal pain  Generalized abdominal pain    New Prescriptions New Prescriptions   No medications on file     Melene PlanDan Azrielle Springsteen, DO 06/04/16 1626

## 2016-06-06 DIAGNOSIS — F17218 Nicotine dependence, cigarettes, with other nicotine-induced disorders: Secondary | ICD-10-CM | POA: Diagnosis not present

## 2016-06-06 DIAGNOSIS — F331 Major depressive disorder, recurrent, moderate: Secondary | ICD-10-CM | POA: Diagnosis not present

## 2016-06-06 DIAGNOSIS — M79604 Pain in right leg: Secondary | ICD-10-CM | POA: Diagnosis not present

## 2016-06-06 DIAGNOSIS — R109 Unspecified abdominal pain: Secondary | ICD-10-CM | POA: Diagnosis not present

## 2016-06-06 DIAGNOSIS — I714 Abdominal aortic aneurysm, without rupture: Secondary | ICD-10-CM | POA: Diagnosis not present

## 2016-06-14 DIAGNOSIS — M79604 Pain in right leg: Secondary | ICD-10-CM | POA: Diagnosis not present

## 2016-06-18 ENCOUNTER — Encounter (HOSPITAL_COMMUNITY): Payer: Self-pay | Admitting: Emergency Medicine

## 2016-06-18 ENCOUNTER — Emergency Department (HOSPITAL_COMMUNITY)
Admission: EM | Admit: 2016-06-18 | Discharge: 2016-06-19 | Disposition: A | Discharge status: Home / Self Care | Payer: PPO | Attending: Emergency Medicine | Admitting: Emergency Medicine

## 2016-06-18 DIAGNOSIS — F17210 Nicotine dependence, cigarettes, uncomplicated: Secondary | ICD-10-CM | POA: Insufficient documentation

## 2016-06-18 DIAGNOSIS — Z79899 Other long term (current) drug therapy: Secondary | ICD-10-CM | POA: Insufficient documentation

## 2016-06-18 DIAGNOSIS — Y908 Blood alcohol level of 240 mg/100 ml or more: Secondary | ICD-10-CM | POA: Diagnosis not present

## 2016-06-18 DIAGNOSIS — F10120 Alcohol abuse with intoxication, uncomplicated: Secondary | ICD-10-CM | POA: Diagnosis not present

## 2016-06-18 DIAGNOSIS — F1010 Alcohol abuse, uncomplicated: Secondary | ICD-10-CM | POA: Insufficient documentation

## 2016-06-18 DIAGNOSIS — F918 Other conduct disorders: Secondary | ICD-10-CM | POA: Diagnosis not present

## 2016-06-18 LAB — CBC WITH DIFFERENTIAL/PLATELET
BASOS ABS: 0 10*3/uL (ref 0.0–0.1)
BASOS PCT: 0 %
EOS ABS: 0.1 10*3/uL (ref 0.0–0.7)
Eosinophils Relative: 1 %
HEMATOCRIT: 47.5 % (ref 39.0–52.0)
Hemoglobin: 16.8 g/dL (ref 13.0–17.0)
Lymphocytes Relative: 17 %
Lymphs Abs: 2 10*3/uL (ref 0.7–4.0)
MCH: 35.7 pg — ABNORMAL HIGH (ref 26.0–34.0)
MCHC: 35.4 g/dL (ref 30.0–36.0)
MCV: 100.8 fL — ABNORMAL HIGH (ref 78.0–100.0)
MONO ABS: 1 10*3/uL (ref 0.1–1.0)
MONOS PCT: 8 %
NEUTROS ABS: 8.3 10*3/uL — AB (ref 1.7–7.7)
Neutrophils Relative %: 74 %
PLATELETS: 289 10*3/uL (ref 150–400)
RBC: 4.71 MIL/uL (ref 4.22–5.81)
RDW: 12.8 % (ref 11.5–15.5)
WBC: 11.4 10*3/uL — ABNORMAL HIGH (ref 4.0–10.5)

## 2016-06-18 LAB — COMPREHENSIVE METABOLIC PANEL
ALBUMIN: 4.5 g/dL (ref 3.5–5.0)
ALT: 20 U/L (ref 17–63)
ANION GAP: 11 (ref 5–15)
AST: 40 U/L (ref 15–41)
Alkaline Phosphatase: 81 U/L (ref 38–126)
BILIRUBIN TOTAL: 0.6 mg/dL (ref 0.3–1.2)
BUN: 11 mg/dL (ref 6–20)
CHLORIDE: 107 mmol/L (ref 101–111)
CO2: 25 mmol/L (ref 22–32)
Calcium: 9.1 mg/dL (ref 8.9–10.3)
Creatinine, Ser: 1.02 mg/dL (ref 0.61–1.24)
GFR calc Af Amer: >60 mL/min (ref >60)
GFR calc non Af Amer: >60 mL/min (ref >60)
GLUCOSE: 90 mg/dL (ref 65–99)
POTASSIUM: 4.1 mmol/L (ref 3.5–5.1)
SODIUM: 143 mmol/L (ref 135–145)
Total Protein: 8.3 g/dL — ABNORMAL HIGH (ref 6.5–8.1)

## 2016-06-18 LAB — RAPID URINE DRUG SCREEN, HOSP PERFORMED
AMPHETAMINES: NOT DETECTED
BARBITURATES: NOT DETECTED
BENZODIAZEPINES: NOT DETECTED
COCAINE: NOT DETECTED
Opiates: NOT DETECTED
TETRAHYDROCANNABINOL: NOT DETECTED

## 2016-06-18 LAB — CBG MONITORING, ED: GLUCOSE-CAPILLARY: 96 mg/dL (ref 65–99)

## 2016-06-18 LAB — ETHANOL: Alcohol, Ethyl (B): 261 mg/dL — ABNORMAL HIGH (ref <5)

## 2016-06-18 MED ORDER — ZIPRASIDONE MESYLATE 20 MG IM SOLR
INTRAMUSCULAR | Status: AC
  Administered 2016-06-18: 20 mg
  Filled 2016-06-18: qty 20

## 2016-06-18 MED ORDER — SODIUM CHLORIDE 0.9 % IV BOLUS (SEPSIS)
1000.0000 mL | Freq: Once | INTRAVENOUS | Status: AC
  Administered 2016-06-18: 1000 mL via INTRAVENOUS

## 2016-06-18 NOTE — ED Notes (Signed)
Bed: WA10 Expected date:  Expected time:  Means of arrival:  Comments: No bed  

## 2016-06-18 NOTE — ED Triage Notes (Signed)
Per [facility] pt from street , was found drinking alcohol at the scene, pt is deaf . Per [facility] pt was calm and cooperative until they got to the ED. Pt is extremely combative . [employer] at bedside. Restraints order given .

## 2016-06-19 NOTE — Discharge Instructions (Signed)
Try to get a primary care doctor. Return if worse

## 2016-06-19 NOTE — ED Provider Notes (Signed)
[facility] DEPT Provider Note   CSN: [id number] Arrival date & time: 06/18/16  1734     History   Chief Complaint Chief Complaint  Patient presents with  . Aggressive Behavior    HPI Lee [name] is a 60 y.o. male.  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 Patient was found on the street drinking alcohol. Patient was initially calm and cooperative until he arrived in the emergency department. He then became extremely combative. No chest pain, dyspnea, neurodeficits.  He has a known drinking problem.       Past Medical History:  Diagnosis Date  . Deaf     Patient Active Problem List   Diagnosis Date Noted  . Pain in joint, ankle and foot   . Adjustment disorder with mixed anxiety and depressed mood   . Alcohol dependence with uncomplicated withdrawal (HCC)   . Alcohol dependence (HCC) 11/15/2015  . 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 NECK SURGERY         Home Medications    Prior to Admission medications   Medication Sig Start Date End Date Taking? Authorizing Provider  citalopram (CELEXA) 40 MG tablet Take 1 tablet (40 mg total) by mouth daily. Patient not taking: Reported on 05/21/2016 12/04/15   Adonis BrookSheila Agustin, NP  cyclobenzaprine (FLEXERIL) 10 MG tablet Take 1 tablet (10 mg total) by mouth 2 (two) times daily as needed for muscle spasms. 05/21/16   Benjiman CoreNathan Pickering, MD  hydrOXYzine (ATARAX/VISTARIL) 25 MG tablet Take 1 tablet (25 mg total) by mouth every 6 (six) hours as needed (anxiety). Patient not taking: Reported on 05/21/2016 12/04/15   Adonis BrookSheila Agustin, NP  ketorolac (TORADOL) 10 MG tablet Take 1 tablet (10 mg total) by mouth 2 (two) times daily. Patient not taking: Reported on 05/21/2016 12/04/15   Adonis BrookSheila Agustin, NP  nicotine (NICODERM CQ - DOSED IN MG/24 HOURS) 21 mg/24hr patch Place 1 patch (21 mg total) onto the skin daily. Patient not taking: Reported on  05/21/2016 12/04/15   Adonis BrookSheila Agustin, NP  oxyCODONE-acetaminophen (PERCOCET) 5-325 MG tablet Take 1-2 tablets by mouth every 6 (six) hours as needed for severe pain. Patient not taking: Reported on 05/21/2016 12/04/15   Adonis BrookSheila Agustin, NP  traZODone (DESYREL) 100 MG tablet Take 1 tablet (100 mg total) by mouth at bedtime as needed for sleep. Patient not taking: Reported on 05/21/2016 12/04/15   Adonis BrookSheila Agustin, NP    Family History No family history on file.  Social History Social History  Substance Use Topics  . Smoking status: Current Every Day Smoker    Packs/day: 1.00    Types: Cigarettes  . Smokeless tobacco: Never Used  . Alcohol use 3.6 oz/week    6 Cans of beer per week     Allergies   Review of patient's allergies indicates no known allergies.   Review of Systems Review of Systems  Reason unable to perform ROS: Deaf and mute.     Physical Exam Updated Vital Signs BP 157/69   Pulse 68   Temp 98.5 F (36.9 C) (Axillary)   Resp 25   SpO2 100%   Physical Exam  Constitutional: He is oriented to person, place, and time.  Deaf and mute, intoxicated  HENT:  Head: Normocephalic and atraumatic.  Eyes: Conjunctivae are normal.  Neck: Neck supple.  Cardiovascular: Normal rate and regular rhythm.   Pulmonary/Chest: Effort normal and breath sounds normal.  Abdominal:  Soft. Bowel sounds are normal.  Musculoskeletal: Normal range of motion.  Neurological: He is alert and oriented to person, place, and time.  Skin: Skin is warm and dry.  Psychiatric:  Combative  Nursing note and vitals reviewed.    ED Treatments / Results  Labs (all labs ordered are listed, but only abnormal results are displayed) Labs Reviewed  CBC WITH DIFFERENTIAL/PLATELET - Abnormal; Notable for the following:       Result Value   WBC 11.4 (*)    MCV 100.8 (*)    MCH 35.7 (*)    Neutro Abs 8.3 (*)    All other components within normal limits  COMPREHENSIVE METABOLIC PANEL - Abnormal; Notable  for the following:    Total Protein 8.3 (*)    All other components within normal limits  ETHANOL - Abnormal; Notable for the following:    Alcohol, Ethyl (B) 261 (*)    All other components within normal limits  URINE RAPID DRUG SCREEN, HOSP PERFORMED  CBG MONITORING, ED    EKG  EKG Interpretation None       Radiology No results found.  Procedures Procedures (including critical care time)  Medications Ordered in ED Medications  ziprasidone (GEODON) 20 MG injection (20 mg  Given 06/18/16 1803)  sodium chloride 0.9 % bolus 1,000 mL (0 mLs Intravenous Stopped 06/18/16 2343)     Initial Impression / Assessment and Plan / ED Course  I have reviewed the triage vital signs and the nursing notes.  Pertinent labs & imaging results that were available during my care of the patient were reviewed by me and considered in my medical decision making (see chart for details).  Clinical Course    Patient initially required IM Geodon for behavioral control. He slept for 2-3 hours.  He then became pleasant and cooperative. No neurological deficit at discharge  Final Clinical Impressions(s) / ED Diagnoses   Final diagnoses:  Alcohol abuse    New Prescriptions New Prescriptions   No medications on file     Donnetta Hutching, MD 06/19/16 0100

## 2016-06-24 DIAGNOSIS — F17218 Nicotine dependence, cigarettes, with other nicotine-induced disorders: Secondary | ICD-10-CM | POA: Diagnosis not present

## 2016-06-24 DIAGNOSIS — R0602 Shortness of breath: Secondary | ICD-10-CM | POA: Diagnosis not present

## 2016-06-24 DIAGNOSIS — R0781 Pleurodynia: Secondary | ICD-10-CM | POA: Diagnosis not present

## 2016-06-24 DIAGNOSIS — F331 Major depressive disorder, recurrent, moderate: Secondary | ICD-10-CM | POA: Diagnosis not present

## 2016-06-24 DIAGNOSIS — B1920 Unspecified viral hepatitis C without hepatic coma: Secondary | ICD-10-CM | POA: Diagnosis not present

## 2016-07-08 ENCOUNTER — Encounter (HOSPITAL_COMMUNITY): Payer: Self-pay | Admitting: Family Medicine

## 2016-07-08 ENCOUNTER — Emergency Department (HOSPITAL_COMMUNITY)
Admission: EM | Admit: 2016-07-08 | Discharge: 2016-07-08 | Disposition: A | Discharge status: Home / Self Care | Payer: PPO | Attending: Emergency Medicine | Admitting: Emergency Medicine

## 2016-07-08 DIAGNOSIS — B1710 Acute hepatitis C without hepatic coma: Secondary | ICD-10-CM | POA: Insufficient documentation

## 2016-07-08 DIAGNOSIS — F17210 Nicotine dependence, cigarettes, uncomplicated: Secondary | ICD-10-CM | POA: Insufficient documentation

## 2016-07-08 DIAGNOSIS — Y909 Presence of alcohol in blood, level not specified: Secondary | ICD-10-CM | POA: Diagnosis not present

## 2016-07-08 DIAGNOSIS — F10120 Alcohol abuse with intoxication, uncomplicated: Secondary | ICD-10-CM | POA: Diagnosis not present

## 2016-07-08 DIAGNOSIS — F1020 Alcohol dependence, uncomplicated: Secondary | ICD-10-CM

## 2016-07-08 DIAGNOSIS — B1920 Unspecified viral hepatitis C without hepatic coma: Secondary | ICD-10-CM | POA: Diagnosis not present

## 2016-07-08 DIAGNOSIS — F1010 Alcohol abuse, uncomplicated: Secondary | ICD-10-CM | POA: Insufficient documentation

## 2016-07-08 DIAGNOSIS — F32A Depression, unspecified: Secondary | ICD-10-CM

## 2016-07-08 DIAGNOSIS — F329 Major depressive disorder, single episode, unspecified: Secondary | ICD-10-CM | POA: Diagnosis not present

## 2016-07-08 DIAGNOSIS — Z79899 Other long term (current) drug therapy: Secondary | ICD-10-CM | POA: Diagnosis not present

## 2016-07-08 HISTORY — DX: Major depressive disorder, single episode, unspecified: F32.9

## 2016-07-08 HISTORY — DX: Chronic viral hepatitis C: B18.2

## 2016-07-08 HISTORY — DX: Anxiety disorder, unspecified: F41.9

## 2016-07-08 HISTORY — DX: Depression, unspecified: F32.A

## 2016-07-08 LAB — COMPREHENSIVE METABOLIC PANEL
ALK PHOS: 74 U/L (ref 38–126)
ALT: 18 U/L (ref 17–63)
AST: 37 U/L (ref 15–41)
Albumin: 4.4 g/dL (ref 3.5–5.0)
Anion gap: 10 (ref 5–15)
BILIRUBIN TOTAL: 0.8 mg/dL (ref 0.3–1.2)
BUN: 8 mg/dL (ref 6–20)
CALCIUM: 9.2 mg/dL (ref 8.9–10.3)
CO2: 24 mmol/L (ref 22–32)
CREATININE: 1.05 mg/dL (ref 0.61–1.24)
Chloride: 106 mmol/L (ref 101–111)
GFR calc Af Amer: >60 mL/min (ref >60)
GLUCOSE: 100 mg/dL — AB (ref 65–99)
POTASSIUM: 4.3 mmol/L (ref 3.5–5.1)
Sodium: 140 mmol/L (ref 135–145)
TOTAL PROTEIN: 8 g/dL (ref 6.5–8.1)

## 2016-07-08 LAB — CBC
HEMATOCRIT: 44.6 % (ref 39.0–52.0)
Hemoglobin: 15.9 g/dL (ref 13.0–17.0)
MCH: 35.9 pg — ABNORMAL HIGH (ref 26.0–34.0)
MCHC: 35.7 g/dL (ref 30.0–36.0)
MCV: 100.7 fL — ABNORMAL HIGH (ref 78.0–100.0)
Platelets: 270 10*3/uL (ref 150–400)
RBC: 4.43 MIL/uL (ref 4.22–5.81)
RDW: 12.7 % (ref 11.5–15.5)
WBC: 8.1 10*3/uL (ref 4.0–10.5)

## 2016-07-08 LAB — RAPID URINE DRUG SCREEN, HOSP PERFORMED
AMPHETAMINES: NOT DETECTED
BARBITURATES: NOT DETECTED
BENZODIAZEPINES: NOT DETECTED
Cocaine: NOT DETECTED
Opiates: NOT DETECTED
TETRAHYDROCANNABINOL: NOT DETECTED

## 2016-07-08 NOTE — ED Notes (Signed)
Bed: [facility] Expected date: 07/08/16 Expected time: 9:57 PM Means of arrival: Ambulance Comments:

## 2016-07-08 NOTE — ED Notes (Signed)
Pt has been changed into scrubs and belongings collected (wallet in pants pocket). And has been wanded by security.

## 2016-07-08 NOTE — ED Notes (Signed)
RN stated Dr.s DC the order for redraw on blood collection.

## 2016-07-08 NOTE — ED Notes (Signed)
Pt is deaf and has a interpreter with him.

## 2016-07-08 NOTE — Discharge Instructions (Signed)
Try to avoid alcohol use.  See your regular doctor, or call another doctor to see in [place], to discuss the hepatitis C.  Go to one of the facilities, listed, to get help with alcohol abuse, and depression

## 2016-07-08 NOTE — ED Triage Notes (Signed)
Patient is from home and [facility]. Patient is under the influence of ETOH but denies any drug use. Patient reports he has been drinking to relieve the right upper quad pain and due to depression. Pt reports he lost his son Sept 2, 2014 and his daughter on Jul 30, 2013. Around this time, he starts drinking a lot. He denies wanting to kill himself but wants to harm himself. Reports he plans to harm himself by drinking. EMS reports that his girlfriend is making comments about harming himself. Pt is deaf and has an interpreter. Interpreter: Karle PlumberBethany Jones with [facility].

## 2016-07-08 NOTE — ED Notes (Signed)
Provided a warm blanket, sandwich, and coke with ice for the wait in the lobby.

## 2016-07-08 NOTE — ED Provider Notes (Signed)
[facility] Provider Note   CSN: [id number] Arrival date & time: 07/08/16  2158     History   Chief Complaint Chief Complaint  Patient presents with  . Psychiatric Evaluation    HPI Lee [name] is a [age] y.o. male. Interview with a trained American sign language interpreter.  He presents for evaluation of depression, alcohol abuse and concern for hepatitis C. He states that he has been drinking more and more recently because of the upcoming anniversary of the death of his children, 3 years ago. He denies suicidal ideation but feels like he is harming himself with the heavy use of alcohol. He denies headache, chest pain, weakness or dizziness. He was told by his PCP, that he has hepatitis C, several weeks ago. He is concerned about what he should do about that. There are no other known modifying factors.  HPI  Past Medical History:  Diagnosis Date  . Anxiety   . Deaf   . Depression   . Hep C w/o coma, chronic [facility]HCC)     Patient Active Problem List   Diagnosis Date Noted  . Pain in joint, ankle and foot   . Adjustment disorder with mixed anxiety and depressed mood   . Alcohol dependence with uncomplicated withdrawal (HCC)   . Alcohol dependence (HCC) 11/15/2015  . 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 NECK SURGERY         Home Medications    Prior to Admission medications   Medication Sig Start Date End Date Taking? Authorizing Provider  citalopram (CELEXA) 20 MG tablet Take 1 tablet by mouth daily. 06/06/16 06/06/17 Yes Historical Provider, MD  GABAPENTIN PO Take 1 capsule by mouth 2 (two) times daily.   Yes Historical Provider, MD  lisinopril (PRINIVIL,ZESTRIL) 10 MG tablet Take 10 mg by mouth daily. 05/22/16 05/22/17 Yes Historical Provider, MD  citalopram (CELEXA) 40 MG tablet Take 1 tablet (40 mg total) by mouth daily. Patient not taking: Reported on 05/21/2016  12/04/15   Adonis BrookSheila Agustin, NP  cyclobenzaprine (FLEXERIL) 10 MG tablet Take 1 tablet (10 mg total) by mouth 2 (two) times daily as needed for muscle spasms. Patient not taking: Reported on 07/08/2016 05/21/16   Benjiman CoreNathan Pickering, MD  hydrOXYzine (ATARAX/VISTARIL) 25 MG tablet Take 1 tablet (25 mg total) by mouth every 6 (six) hours as needed (anxiety). Patient not taking: Reported on 05/21/2016 12/04/15   Adonis BrookSheila Agustin, NP  ketorolac (TORADOL) 10 MG tablet Take 1 tablet (10 mg total) by mouth 2 (two) times daily. Patient not taking: Reported on 05/21/2016 12/04/15   Adonis BrookSheila Agustin, NP  nicotine (NICODERM CQ - DOSED IN MG/24 HOURS) 21 mg/24hr patch Place 1 patch (21 mg total) onto the skin daily. Patient not taking: Reported on 05/21/2016 12/04/15   Adonis BrookSheila Agustin, NP  oxyCODONE-acetaminophen (PERCOCET) 5-325 MG tablet Take 1-2 tablets by mouth every 6 (six) hours as needed for severe pain. Patient not taking: Reported on 05/21/2016 12/04/15   Adonis BrookSheila Agustin, NP  traZODone (DESYREL) 100 MG tablet Take 1 tablet (100 mg total) by mouth at bedtime as needed for sleep. Patient not taking: Reported on 05/21/2016 12/04/15   Adonis BrookSheila Agustin, NP    Family History History reviewed. No pertinent family history.  Social History Social History  Substance Use Topics  . Smoking status: Current Every Day Smoker    Packs/day: 1.00    Types: Cigarettes  . Smokeless tobacco: Never  Used  . Alcohol use Yes     Comment: When he gets money, been drinking for about 9 days straight. 4-24oz beers in a day. Last drink: 20:00     Allergies   Review of patient's allergies indicates no known allergies.   Review of Systems Review of Systems  All other systems reviewed and are negative.    Physical Exam Updated Vital Signs BP 138/81   Pulse 60   Temp 98.3 F (36.8 C) (Oral)   Resp 18   Ht 5\' 9"  (1.753 m)   Wt 150 lb (68 kg)   SpO2 97%   BMI 22.15 kg/m   Physical Exam  Constitutional: He is oriented to person,  place, and time. He appears well-developed.  He appears under nourished, and older than stated age  HENT:  Head: Normocephalic and atraumatic.  Right Ear: External ear normal.  Left Ear: External ear normal.  Eyes: Conjunctivae and EOM are normal. Pupils are equal, round, and reactive to light.  Neck: Normal range of motion and phonation normal. Neck supple.  Cardiovascular: Normal rate.   Pulmonary/Chest: Effort normal. He exhibits no bony tenderness.  Musculoskeletal: Normal range of motion.  Neurological: He is alert and oriented to person, place, and time. No sensory deficit. He exhibits normal muscle tone. Coordination normal.  No dysarthria or aphasia  Skin: Skin is warm, dry and intact.  Psychiatric: He has a normal mood and affect. His behavior is normal. Judgment and thought content normal.  Nursing note and vitals reviewed.    ED Treatments / Results  Labs (all labs ordered are listed, but only abnormal results are displayed) Labs Reviewed  COMPREHENSIVE METABOLIC PANEL - Abnormal; Notable for the following:       Result Value   Glucose, Bld 100 (*)    All other components within normal limits  CBC - Abnormal; Notable for the following:    MCV 100.7 (*)    MCH 35.9 (*)    All other components within normal limits  RAPID URINE DRUG SCREEN, HOSP PERFORMED  ETHANOL  SALICYLATE LEVEL  ACETAMINOPHEN LEVEL    EKG  EKG Interpretation None       Radiology No results found.  Procedures Procedures (including critical care time)  Medications Ordered in ED Medications - No data to display   Initial Impression / Assessment and Plan / ED Course  I have reviewed the triage vital signs and the nursing notes.  Pertinent labs & imaging results that were available during my care of the patient were reviewed by me and considered in my medical decision making (see chart for details).  Clinical Course     Medications - No data to display  Patient Vitals for the  past 24 hrs:  BP Temp Temp src Pulse Resp SpO2 Height Weight  07/08/16 2233 - - - - - - 5\' 9"  (1.753 m) 150 lb (68 kg)  07/08/16 2226 - - - - - 97 % - -  07/08/16 2205 138/81 98.3 F (36.8 C) Oral 60 18 95 % - -    11:36 PM Reevaluation with update and discussion. After initial assessment and treatment, an updated evaluation reveals no change in clinical status. Findings discussed with patient and all questions were answered. Jassen Sarver L     Final Clinical Impressions(s) / ED Diagnoses   Final diagnoses:  Depression, unspecified depression type  Alcoholism (HCC)  Hepatitis C virus infection without hepatic coma, unspecified chronicity    Alcohol abuse, with depression,  related to stress. Patient is not currently suicidal or homicidal. There is no acute unstable psychiatric condition. Doubt acute complication of hepatitis, suspect bacterial infection. Metabolic instability or impending vascular collapse.  Nursing Notes Reviewed/ Care Coordinated Applicable Imaging Reviewed Interpretation of Laboratory Data incorporated into ED treatment  The patient appears reasonably screened and/or stabilized for discharge and I doubt any other medical condition or other [facility] requiring further screening, evaluation, or treatment in the ED at this time prior to discharge.  Plan: Home Medications- continue; Home Treatments- rest, avoid EtOH; return here if the recommended treatment, does not improve the symptoms; Recommended follow up- PCP 1-2 weeks. Alcohol/Depression treatment as OP asap   New Prescriptions New Prescriptions   No medications on file     Mancel Bale, MD 07/08/16 2339

## 2016-07-09 ENCOUNTER — Telehealth (HOSPITAL_BASED_OUTPATIENT_CLINIC_OR_DEPARTMENT_OTHER): Payer: Self-pay | Admitting: Emergency Medicine

## 2016-08-11 ENCOUNTER — Emergency Department (HOSPITAL_COMMUNITY): Payer: PPO

## 2016-08-11 ENCOUNTER — Emergency Department (HOSPITAL_COMMUNITY)
Admission: EM | Admit: 2016-08-11 | Discharge: 2016-08-11 | Disposition: A | Discharge status: Home / Self Care | Payer: PPO | Attending: Emergency Medicine | Admitting: Emergency Medicine

## 2016-08-11 ENCOUNTER — Encounter (HOSPITAL_COMMUNITY): Payer: Self-pay | Admitting: Emergency Medicine

## 2016-08-11 DIAGNOSIS — Z791 Long term (current) use of non-steroidal anti-inflammatories (NSAID): Secondary | ICD-10-CM | POA: Diagnosis not present

## 2016-08-11 DIAGNOSIS — R05 Cough: Secondary | ICD-10-CM | POA: Diagnosis not present

## 2016-08-11 DIAGNOSIS — Y999 Unspecified external cause status: Secondary | ICD-10-CM | POA: Insufficient documentation

## 2016-08-11 DIAGNOSIS — Y929 Unspecified place or not applicable: Secondary | ICD-10-CM | POA: Diagnosis not present

## 2016-08-11 DIAGNOSIS — Y939 Activity, unspecified: Secondary | ICD-10-CM | POA: Insufficient documentation

## 2016-08-11 DIAGNOSIS — R0789 Other chest pain: Secondary | ICD-10-CM | POA: Diagnosis not present

## 2016-08-11 DIAGNOSIS — Z79899 Other long term (current) drug therapy: Secondary | ICD-10-CM | POA: Diagnosis not present

## 2016-08-11 DIAGNOSIS — F17210 Nicotine dependence, cigarettes, uncomplicated: Secondary | ICD-10-CM | POA: Insufficient documentation

## 2016-08-11 DIAGNOSIS — S2241XA Multiple fractures of ribs, right side, initial encounter for closed fracture: Secondary | ICD-10-CM | POA: Diagnosis not present

## 2016-08-11 DIAGNOSIS — R0602 Shortness of breath: Secondary | ICD-10-CM | POA: Diagnosis not present

## 2016-08-11 DIAGNOSIS — S299XXA Unspecified injury of thorax, initial encounter: Secondary | ICD-10-CM | POA: Diagnosis not present

## 2016-08-11 LAB — I-STAT TROPONIN, ED: Troponin i, poc: 0.01 ng/mL (ref 0.00–0.08)

## 2016-08-11 MED ORDER — OXYCODONE-ACETAMINOPHEN 5-325 MG PO TABS
2.0000 | ORAL_TABLET | Freq: Once | ORAL | Status: AC
  Administered 2016-08-11: 2 via ORAL
  Filled 2016-08-11: qty 2

## 2016-08-11 MED ORDER — KETOROLAC TROMETHAMINE 60 MG/2ML IM SOLN
60.0000 mg | Freq: Once | INTRAMUSCULAR | Status: AC
  Administered 2016-08-11: 60 mg via INTRAMUSCULAR
  Filled 2016-08-11: qty 2

## 2016-08-11 MED ORDER — METHOCARBAMOL 750 MG PO TABS: 750.0000 mg | ORAL_TABLET | Freq: Four times a day (QID) | ORAL | 0 refills | Status: DC

## 2016-08-11 MED ORDER — OXYCODONE-ACETAMINOPHEN 5-325 MG PO TABS: 2.0000 | ORAL_TABLET | ORAL | 0 refills | Status: DC | PRN

## 2016-08-11 NOTE — ED Notes (Signed)
Bed: [id number] Expected date:  Expected time:  Means of arrival:  Comments: 60yo M chest wall pain
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
## 2016-08-13 ENCOUNTER — Other Ambulatory Visit: Payer: Self-pay
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
## 2016-08-14 ENCOUNTER — Telehealth: Payer: Self-pay | Admitting: *Deleted

## 2016-08-14 NOTE — Telephone Encounter (Signed)
Called patient via deaf interpreter stating he was referred to this clinic by PCP and to call back and ask to speak to [name].  Note sent to MD to review recent ED visits. Please advise whether to proceed with Hepatitis C referral. Lee MolaJacqueline Conard Alvira

## 2016-08-14 NOTE — Telephone Encounter (Signed)
Yes, he has active chornic hepatitis C .  Positive antibody and viral load in CareEverywhere.

## 2016-08-15 ENCOUNTER — Other Ambulatory Visit: Payer: Self-pay | Admitting: *Deleted

## 2016-08-15 NOTE — Patient Outreach (Signed)
[facility]) Care Management  08/15/2016  Lee PlanRobert [name] 1956/07/21 [id number]   CSW made an initial phone contact with patient today. CSW introduced self/role and he was able to communicate with me via phone interpreter (hearing impaired).  Patient reports he is in need of transportation to get to his PCP appointment in December-  CSW contacted [employer] who has received part of his application and will complete PART B for client as this was not done. CSW will [name] to send other community transportation resources in the mail to patient to offer other resources while he awaits word from [employer].  CSW will [name] follow up with [employer] and patient in 2-3 weeks to ensure he has ride to see his PCP.   Reece LevyJanet Lonni Dirden, MSW, LCSW Clinical Social Worker  [facility] [phone]

## 2016-08-16 NOTE — Patient Outreach (Signed)
[facility]) Care Management  08/16/2016  Lee PlanRobert [name] 1956/09/01 [id number]   Request received from Lee LevyJanet Caldwell, LCSW to mail patient transportation resource information. Information mailed today, 08/16/16  Lee PoeNicole Sherwood [name], Conrad BurlingtonB.A.  [facility] Care Management Assistant

## 2016-08-16 NOTE — Telephone Encounter (Signed)
Per Dr. Luciana Axeomer patient can be scheduled as long as he is not currently drinking alcohol and has been in a program that states this. Referring office has been notified. [facility] [phone]. Wendall MolaJacqueline Cockerham

## 2016-08-30 ENCOUNTER — Other Ambulatory Visit: Payer: Self-pay | Admitting: *Deleted

## 2016-08-30 NOTE — Patient Outreach (Signed)
[facility]) Care Management  08/30/2016  Lee [name] Jan 30, 1956 [id number]   Unable to reach patient by phone today- will [name] f/u call next week.  Reece Levy.Omolola Mittman, MSW, LCSW Clinical Social Worker  [facility] [phone]

## 2016-09-01 ENCOUNTER — Emergency Department (HOSPITAL_COMMUNITY): Payer: PPO

## 2016-09-01 ENCOUNTER — Emergency Department (HOSPITAL_COMMUNITY)
Admission: EM | Admit: 2016-09-01 | Discharge: 2016-09-01 | Disposition: A | Discharge status: Home / Self Care | Payer: PPO | Attending: Emergency Medicine | Admitting: Emergency Medicine

## 2016-09-01 ENCOUNTER — Encounter (HOSPITAL_COMMUNITY): Payer: Self-pay | Admitting: Emergency Medicine

## 2016-09-01 DIAGNOSIS — T1490XA Injury, unspecified, initial encounter: Secondary | ICD-10-CM | POA: Diagnosis not present

## 2016-09-01 DIAGNOSIS — R0789 Other chest pain: Secondary | ICD-10-CM | POA: Insufficient documentation

## 2016-09-01 DIAGNOSIS — Z79899 Other long term (current) drug therapy: Secondary | ICD-10-CM | POA: Diagnosis not present

## 2016-09-01 DIAGNOSIS — R030 Elevated blood-pressure reading, without diagnosis of hypertension: Secondary | ICD-10-CM | POA: Diagnosis not present

## 2016-09-01 DIAGNOSIS — R0781 Pleurodynia: Secondary | ICD-10-CM | POA: Diagnosis not present

## 2016-09-01 DIAGNOSIS — F17210 Nicotine dependence, cigarettes, uncomplicated: Secondary | ICD-10-CM | POA: Diagnosis not present

## 2016-09-01 LAB — COMPREHENSIVE METABOLIC PANEL
ALBUMIN: 4.3 g/dL (ref 3.5–5.0)
ALK PHOS: 95 U/L (ref 38–126)
ALT: 125 U/L — ABNORMAL HIGH (ref 17–63)
ANION GAP: 10 (ref 5–15)
AST: 107 U/L — ABNORMAL HIGH (ref 15–41)
BILIRUBIN TOTAL: 0.5 mg/dL (ref 0.3–1.2)
BUN: 15 mg/dL (ref 6–20)
CALCIUM: 8.8 mg/dL — AB (ref 8.9–10.3)
CO2: 23 mmol/L (ref 22–32)
Chloride: 109 mmol/L (ref 101–111)
Creatinine, Ser: 1.04 mg/dL (ref 0.61–1.24)
GLUCOSE: 90 mg/dL (ref 65–99)
POTASSIUM: 4.1 mmol/L (ref 3.5–5.1)
Sodium: 142 mmol/L (ref 135–145)
TOTAL PROTEIN: 7.9 g/dL (ref 6.5–8.1)

## 2016-09-01 LAB — CBC WITH DIFFERENTIAL/PLATELET
BASOS ABS: 0.1 10*3/uL (ref 0.0–0.1)
BASOS PCT: 1 %
Eosinophils Absolute: 0.1 10*3/uL (ref 0.0–0.7)
Eosinophils Relative: 2 %
HEMATOCRIT: 45.3 % (ref 39.0–52.0)
Hemoglobin: 15.8 g/dL (ref 13.0–17.0)
LYMPHS PCT: 43 %
Lymphs Abs: 2.5 10*3/uL (ref 0.7–4.0)
MCH: 34.6 pg — ABNORMAL HIGH (ref 26.0–34.0)
MCHC: 34.9 g/dL (ref 30.0–36.0)
MCV: 99.3 fL (ref 78.0–100.0)
MONO ABS: 0.7 10*3/uL (ref 0.1–1.0)
Monocytes Relative: 11 %
NEUTROS ABS: 2.5 10*3/uL (ref 1.7–7.7)
Neutrophils Relative %: 43 %
PLATELETS: 233 10*3/uL (ref 150–400)
RBC: 4.56 MIL/uL (ref 4.22–5.81)
RDW: 13 % (ref 11.5–15.5)
WBC: 5.8 10*3/uL (ref 4.0–10.5)

## 2016-09-01 LAB — I-STAT TROPONIN, ED: Troponin i, poc: 0 ng/mL (ref 0.00–0.08)

## 2016-09-01 MED ORDER — FENTANYL CITRATE (PF) 100 MCG/2ML IJ SOLN
50.0000 ug | INTRAMUSCULAR | Status: DC | PRN
  Administered 2016-09-01: 50 ug via NASAL
  Filled 2016-09-01: qty 2

## 2016-09-01 MED ORDER — LIDOCAINE 5 % EX PTCH: 1.0000 | MEDICATED_PATCH | CUTANEOUS | 0 refills | Status: DC

## 2016-09-01 MED ORDER — IBUPROFEN 800 MG PO TABS
800.0000 mg | ORAL_TABLET | Freq: Once | ORAL | Status: AC
  Administered 2016-09-01: 800 mg via ORAL
  Filled 2016-09-01: qty 1

## 2016-09-01 MED ORDER — HYDROXYZINE HCL 25 MG PO TABS: 25.0000 mg | ORAL_TABLET | Freq: Three times a day (TID) | ORAL | 0 refills | Status: DC | PRN

## 2016-09-01 NOTE — ED Notes (Signed)
Bed: WA24 Expected date:  Expected time:  Means of arrival:  Comments: 60 yo rib pain- deaf, needs interpreter

## 2016-09-01 NOTE — Discharge Instructions (Signed)
Your liver enzymes were elevated today.  Follow up with your family doctor for recheck.  Do not drink alcohol and try to stop smoking.  You can take ibuprofen, available over the counter for pain
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
## 2016-09-01 NOTE — ED Notes (Signed)
ED Provider at bedside. 

## 2016-09-03 ENCOUNTER — Encounter (HOSPITAL_COMMUNITY): Payer: Self-pay | Admitting: Emergency Medicine

## 2016-09-03 ENCOUNTER — Emergency Department (HOSPITAL_COMMUNITY)
Admission: EM | Admit: 2016-09-03 | Discharge: 2016-09-04 | Disposition: A | Discharge status: Home / Self Care | Payer: PPO | Attending: Emergency Medicine | Admitting: Emergency Medicine

## 2016-09-03 DIAGNOSIS — R1084 Generalized abdominal pain: Secondary | ICD-10-CM | POA: Diagnosis not present

## 2016-09-03 DIAGNOSIS — F17210 Nicotine dependence, cigarettes, uncomplicated: Secondary | ICD-10-CM | POA: Diagnosis not present

## 2016-09-03 DIAGNOSIS — R45851 Suicidal ideations: Secondary | ICD-10-CM | POA: Diagnosis not present

## 2016-09-03 DIAGNOSIS — F1014 Alcohol abuse with alcohol-induced mood disorder: Secondary | ICD-10-CM

## 2016-09-03 DIAGNOSIS — Z79899 Other long term (current) drug therapy: Secondary | ICD-10-CM | POA: Diagnosis not present

## 2016-09-03 DIAGNOSIS — F1024 Alcohol dependence with alcohol-induced mood disorder: Secondary | ICD-10-CM | POA: Diagnosis not present

## 2016-09-03 DIAGNOSIS — F10920 Alcohol use, unspecified with intoxication, uncomplicated: Secondary | ICD-10-CM

## 2016-09-03 DIAGNOSIS — F10120 Alcohol abuse with intoxication, uncomplicated: Secondary | ICD-10-CM | POA: Diagnosis not present

## 2016-09-03 DIAGNOSIS — F10230 Alcohol dependence with withdrawal, uncomplicated: Secondary | ICD-10-CM

## 2016-09-03 DIAGNOSIS — F1020 Alcohol dependence, uncomplicated: Secondary | ICD-10-CM | POA: Diagnosis present

## 2016-09-03 DIAGNOSIS — F10220 Alcohol dependence with intoxication, uncomplicated: Secondary | ICD-10-CM | POA: Diagnosis not present

## 2016-09-03 LAB — RAPID URINE DRUG SCREEN, HOSP PERFORMED
AMPHETAMINES: NOT DETECTED
BARBITURATES: NOT DETECTED
BENZODIAZEPINES: NOT DETECTED
COCAINE: NOT DETECTED
Opiates: POSITIVE — AB
TETRAHYDROCANNABINOL: NOT DETECTED

## 2016-09-03 LAB — COMPREHENSIVE METABOLIC PANEL
ALBUMIN: 4.4 g/dL (ref 3.5–5.0)
ALT: 113 U/L — ABNORMAL HIGH (ref 17–63)
ANION GAP: 9 (ref 5–15)
AST: 138 U/L — AB (ref 15–41)
Alkaline Phosphatase: 104 U/L (ref 38–126)
BUN: 16 mg/dL (ref 6–20)
CHLORIDE: 109 mmol/L (ref 101–111)
CO2: 24 mmol/L (ref 22–32)
Calcium: 9.2 mg/dL (ref 8.9–10.3)
Creatinine, Ser: 0.94 mg/dL (ref 0.61–1.24)
GFR calc Af Amer: >60 mL/min (ref >60)
Glucose, Bld: 87 mg/dL (ref 65–99)
POTASSIUM: 4.1 mmol/L (ref 3.5–5.1)
Sodium: 142 mmol/L (ref 135–145)
Total Bilirubin: 0.7 mg/dL (ref 0.3–1.2)
Total Protein: 7.6 g/dL (ref 6.5–8.1)

## 2016-09-03 LAB — D-DIMER, QUANTITATIVE: D-Dimer, Quant: 1 ug/mL-FEU — ABNORMAL HIGH (ref 0.00–0.50)

## 2016-09-03 LAB — CBC
HEMATOCRIT: 43.5 % (ref 39.0–52.0)
HEMOGLOBIN: 15 g/dL (ref 13.0–17.0)
MCH: 34.2 pg — ABNORMAL HIGH (ref 26.0–34.0)
MCHC: 34.5 g/dL (ref 30.0–36.0)
MCV: 99.3 fL (ref 78.0–100.0)
Platelets: 208 10*3/uL (ref 150–400)
RBC: 4.38 MIL/uL (ref 4.22–5.81)
RDW: 13 % (ref 11.5–15.5)
WBC: 8 10*3/uL (ref 4.0–10.5)

## 2016-09-03 LAB — ETHANOL: ALCOHOL ETHYL (B): 299 mg/dL — AB (ref <5)

## 2016-09-03 MED ORDER — IBUPROFEN 800 MG PO TABS
800.0000 mg | ORAL_TABLET | Freq: Once | ORAL | Status: AC
  Administered 2016-09-03: 800 mg via ORAL
  Filled 2016-09-03: qty 1
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
## 2016-09-03 NOTE — Progress Notes (Signed)
CSW received call from patients spouse through a sign language service. Spouse stated "needs need to be committed and does not need to be home. He just left rehab after 45 days and is not any better". Spouse states that patient has threatened to kill himself and she does not want him coming home. CSW explained TTS services/ if they would be needed and resources for inpatient rehab. Patients spouse stated "I will IVC him if the doctor will not".   Diane (spouse)- [phone]  Stacy GardnerErin Nayzeth Altman, [facility] Center[facility]WA Clinical Social Worker [phone]

## 2016-09-03 NOTE — ED Notes (Signed)
Unable to use wall-e  For translating due to poor connection/ pixilated image. Interpreter being called in for bedside translation.

## 2016-09-04 DIAGNOSIS — F1014 Alcohol abuse with alcohol-induced mood disorder: Secondary | ICD-10-CM | POA: Diagnosis present

## 2016-09-04 MED ORDER — LORAZEPAM 2 MG/ML IJ SOLN: 0.0000 mg | Freq: Two times a day (BID) | INTRAMUSCULAR | Status: DC

## 2016-09-04 MED ORDER — LORAZEPAM 1 MG PO TABS
1.0000 mg | ORAL_TABLET | Freq: Once | ORAL | Status: AC
  Administered 2016-09-04: 1 mg via ORAL
  Filled 2016-09-04: qty 1

## 2016-09-04 MED ORDER — CITALOPRAM HYDROBROMIDE 40 MG PO TABS: 10.0000 mg | ORAL_TABLET | Freq: Every day | ORAL | 0 refills | Status: DC

## 2016-09-04 MED ORDER — KETOROLAC TROMETHAMINE 30 MG/ML IJ SOLN
30.0000 mg | Freq: Once | INTRAMUSCULAR | Status: AC
  Administered 2016-09-04: 30 mg via INTRAVENOUS
  Filled 2016-09-04: qty 1

## 2016-09-04 MED ORDER — LORAZEPAM 1 MG PO TABS: 0.0000 mg | ORAL_TABLET | Freq: Two times a day (BID) | ORAL | Status: DC

## 2016-09-04 MED ORDER — IBUPROFEN 800 MG PO TABS
800.0000 mg | ORAL_TABLET | Freq: Once | ORAL | Status: AC
  Administered 2016-09-04: 800 mg via ORAL
  Filled 2016-09-04: qty 1

## 2016-09-04 MED ORDER — LORAZEPAM 2 MG/ML IJ SOLN: 0.0000 mg | Freq: Four times a day (QID) | INTRAMUSCULAR | Status: DC

## 2016-09-04 MED ORDER — TRAZODONE HCL 100 MG PO TABS: 100.0000 mg | ORAL_TABLET | Freq: Every evening | ORAL | 0 refills | Status: DC | PRN

## 2016-09-04 MED ORDER — LORAZEPAM 1 MG PO TABS: 0.0000 mg | ORAL_TABLET | Freq: Four times a day (QID) | ORAL | Status: DC
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
## 2016-09-04 NOTE — [facility] Counselor (Signed)
Writer was informed by Sheryle SprayKelly Owens [phone] that the sign language interpreter would arrive after 11am. Patient will continue to remain in the ED awaiting a TTS assessment.

## 2016-09-04 NOTE — [facility] Counselor (Signed)
Received another call from Sheryle SprayKelly Owens #[phone] stating that the interpreter was in [facility].

## 2016-09-04 NOTE — Progress Notes (Signed)
Chaplain paged by RN.  Pt in need of coat for discharge.  Coat provided from [employer] [facility] [employer].   Provided brief support with pt working with ASL interpreter.     Belva CromeStalnaker, Aayana Reinertsen Wayne MDiv

## 2016-09-04 NOTE — ED Notes (Signed)
Interpreter leaving at this time. Patient sleeping.  Call interpreter agency in am to be present at TTS consult.  Aileen PilotKelle [phone]

## 2016-09-04 NOTE — ED Notes (Signed)
Patient continues to sleep. VSS. Patient startles easily and prefers to be touched on shoulder when getting his attention or waving at him when he is awake.

## 2016-09-04 NOTE — ED Notes (Signed)
TTS at bedside with translator

## 2016-09-04 NOTE — ED Notes (Signed)
Still waiting on translator

## 2016-09-04 NOTE — BH Assessment (Signed)
TTS ordered for this patient. Patient reportedly is hearing impaired and a sign language interpreter is needed to communicate with this patient. Writer contacted interpreter agency and spoke to W.W. Grainger IncKelly Owens [phone]. Tresa EndoKelly will try to send a sign language interpreter [facility] to assist with the TTS assessment approximately 0915. Meanwhile Clinical research associatewriter asked patient's nurse to remove TTS consult and re-order when the sign language interpreter arrives. She agreed.

## 2016-09-04 NOTE — ED Notes (Signed)
Translator arrived. TTS called.

## 2016-09-05 ENCOUNTER — Emergency Department (HOSPITAL_COMMUNITY)
Admission: EM | Admit: 2016-09-05 | Discharge: 2016-09-05 | Disposition: A | Discharge status: Home / Self Care | Payer: PPO | Attending: Emergency Medicine | Admitting: Emergency Medicine

## 2016-09-05 ENCOUNTER — Other Ambulatory Visit: Payer: Self-pay | Admitting: *Deleted

## 2016-09-05 ENCOUNTER — Emergency Department (HOSPITAL_BASED_OUTPATIENT_CLINIC_OR_DEPARTMENT_OTHER): Admit: 2016-09-05 | Discharge: 2016-09-05 | Disposition: A | Discharge status: Home / Self Care | Payer: PPO

## 2016-09-05 ENCOUNTER — Encounter (HOSPITAL_COMMUNITY): Payer: Self-pay | Admitting: Family Medicine

## 2016-09-05 DIAGNOSIS — F17210 Nicotine dependence, cigarettes, uncomplicated: Secondary | ICD-10-CM | POA: Diagnosis not present

## 2016-09-05 DIAGNOSIS — Y999 Unspecified external cause status: Secondary | ICD-10-CM | POA: Insufficient documentation

## 2016-09-05 DIAGNOSIS — M79609 Pain in unspecified limb: Secondary | ICD-10-CM

## 2016-09-05 DIAGNOSIS — Z79899 Other long term (current) drug therapy: Secondary | ICD-10-CM | POA: Insufficient documentation

## 2016-09-05 DIAGNOSIS — T148XXA Other injury of unspecified body region, initial encounter: Secondary | ICD-10-CM | POA: Diagnosis not present

## 2016-09-05 DIAGNOSIS — R0781 Pleurodynia: Secondary | ICD-10-CM

## 2016-09-05 DIAGNOSIS — W500XXA Accidental hit or strike by another person, initial encounter: Secondary | ICD-10-CM | POA: Diagnosis not present

## 2016-09-05 DIAGNOSIS — M79661 Pain in right lower leg: Secondary | ICD-10-CM | POA: Insufficient documentation

## 2016-09-05 DIAGNOSIS — Y939 Activity, unspecified: Secondary | ICD-10-CM | POA: Diagnosis not present

## 2016-09-05 DIAGNOSIS — Y929 Unspecified place or not applicable: Secondary | ICD-10-CM | POA: Insufficient documentation

## 2016-09-05 DIAGNOSIS — R071 Chest pain on breathing: Secondary | ICD-10-CM | POA: Diagnosis not present

## 2016-09-05 MED ORDER — OXYCODONE-ACETAMINOPHEN 5-325 MG PO TABS
1.0000 | ORAL_TABLET | Freq: Once | ORAL | Status: AC
  Administered 2016-09-05: 1 via ORAL
  Filled 2016-09-05: qty 1

## 2016-09-05 MED ORDER — ACETAMINOPHEN 500 MG PO TABS: 500.0000 mg | ORAL_TABLET | Freq: Four times a day (QID) | ORAL | 0 refills | Status: DC | PRN

## 2016-09-05 NOTE — Progress Notes (Signed)
*  PRELIMINARY RESULTS* Vascular Ultrasound Right lower extremity venous duplex has been completed.  Preliminary findings: No evidence of DVT or baker's cyst.  Farrel DemarkJill Eunice, RDMS, RVT  09/05/2016, 8:28 AM

## 2016-09-05 NOTE — ED Provider Notes (Signed)
Patient care [facility] from [facility] PA-C at shift change. Please see her note for further.  Briefly, patient presented with atraumatic right leg pain and at shift change is awaiting DVT study. If negative patient can be discharged home. No prescriptions for narcotic medicines at discharge. DVT study returned negative. Patient eating breakfast when I entered the room. I discussed his results using sign language interpreter. I encouraged patient to take Tylenol as needed for pain and to follow-up closely with primary care. I discussed return precautions. I advised the patient to follow-up with their primary care provider this week. I advised the patient to return to the emergency department with new or worsening symptoms or new concerns. The patient verbalized understanding and agreement with plan.    Pleuritic chest pain  Right calf pain     Lee FarrierWilliam Reyanna Baley, PA-C 09/05/16 [phone]    April Palumbo, MD 10/16/16 2254
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
## 2016-09-05 NOTE — ED Provider Notes (Signed)
[facility]-EMERGENCY DEPT Provider Note   CSN: [id number] Arrival date & time: 09/05/16  0058    History   Chief Complaint Chief Complaint  Patient presents with  . Leg Pain  . Abdominal Pain    HPI Lee [name] is a 60 y.o. male.  60 year old male with a history of depression, anxiety, hepatitis C, and alcohol abuse presents to the emergency department shortly following discharge for complaints of persistent right-sided chest pain. He does have a history of rib fracture. He states pain is worse with deep breathing. He has taken ibuprofen, but states that this does not help him. He had a repeat chest x-ray on 09/01/2016 which did not show any acute changes. No focal consolidation, pneumonia, or pneumothorax. Breath smells of alcohol. He reports drinking after discharge. He has not seen his primary care doctor regarding this complaint.  Patient also complaining of right calf pain. He reports that pain is a dramatic in onset and is worse with ambulation. He believes that his calf has become more swollen than normal. He has no personal or family history of blood clots. Ibuprofen taken prior to arrival, also, did not help this pain. No numbness or weakness in the extremity.   The history is provided by the patient. A language interpreter was used ([employer]).  Leg Pain    Abdominal Pain   Associated symptoms include myalgias.    Past Medical History:  Diagnosis Date  . Anxiety   . Deaf   . Depression   . Hep C w/o coma, chronic [facility](HCC)     Patient Active Problem List   Diagnosis Date Noted  . Alcohol abuse with alcohol-induced mood disorder (HCC) 09/04/2016  . Pain in joint, ankle and foot   . Adjustment disorder with mixed anxiety and depressed mood   . Alcohol dependence with uncomplicated withdrawal (HCC)   . Alcohol dependence (HCC) 11/15/2015  . Severe recurrent major depression without psychotic features (HCC) 11/15/2015    Past Surgical History:  Procedure Laterality Date   . NECK SURGERY       Home Medications    Prior to Admission medications   Medication Sig Start Date End Date Taking? Authorizing Provider  citalopram (CELEXA) 40 MG tablet Take 0.5 tablets (20 mg total) by mouth daily. 09/04/16   Charm RingsJamison Y Lord, NP  cyclobenzaprine (FLEXERIL) 10 MG tablet Take 1 tablet (10 mg total) by mouth 2 (two) times daily as needed for muscle spasms. Patient not taking: Reported on 07/08/2016 05/21/16   Benjiman CoreNathan Pickering, MD  hydrOXYzine (ATARAX/VISTARIL) 25 MG tablet Take 1 tablet (25 mg total) by mouth every 8 (eight) hours as needed for anxiety. 09/01/16   Tilden FossaElizabeth Rees, MD  ketorolac (TORADOL) 10 MG tablet Take 1 tablet (10 mg total) by mouth 2 (two) times daily. Patient not taking: Reported on 05/21/2016 12/04/15   Adonis BrookSheila Agustin, NP  lidocaine (LIDODERM) 5 % Place 1 patch onto the skin daily. Remove & Discard patch within 12 hours or as directed by MD 09/01/16   Tilden FossaElizabeth Rees, MD  methocarbamol (ROBAXIN-750) 750 MG tablet Take 1 tablet (750 mg total) by mouth 4 (four) times daily. Patient not taking: Reported on 09/04/2016 08/11/16   Lorre NickAnthony Allen, MD  nicotine (NICODERM CQ - DOSED IN MG/24 HOURS) 21 mg/24hr patch Place 1 patch (21 mg total) onto the skin daily. Patient not taking: Reported on 05/21/2016 12/04/15   Adonis BrookSheila Agustin, NP  oxyCODONE-acetaminophen (PERCOCET) 5-325 MG tablet Take 1-2 tablets by mouth every 6 (six) hours  as needed for severe pain. Patient not taking: Reported on 05/21/2016 12/04/15   Adonis Brook, NP  oxyCODONE-acetaminophen (PERCOCET/ROXICET) 5-325 MG tablet Take 2 tablets by mouth every 4 (four) hours as needed for severe pain. 08/11/16   Lorre Nick, MD  traZODone (DESYREL) 100 MG tablet Take 1 tablet (100 mg total) by mouth at bedtime as needed for sleep. 09/04/16   Charm Rings, NP    Family History History reviewed. No pertinent family history.  Social History Social History  Substance Use Topics  . Smoking status: Current Every Day  Smoker    Packs/day: 1.00    Types: Cigarettes  . Smokeless tobacco: Never Used  . Alcohol use Yes     Comment: When he gets money, been drinking for about 9 days straight. 4-24oz beers in a day. Last drink: 20:00     Allergies   Patient has no known allergies.   Review of Systems Review of Systems  Cardiovascular: Positive for chest pain and leg swelling.  Musculoskeletal: Positive for myalgias.  Ten systems reviewed and are negative for acute change, except as noted in the HPI.    Physical Exam Updated Vital Signs BP 148/84 (BP Location: Right Arm)   Pulse 70   Resp 16   SpO2 96%   Physical Exam  Constitutional: He is oriented to person, place, and time. He appears well-developed and well-nourished. No distress.  Nontoxic and in NAD  HENT:  Head: Normocephalic and atraumatic.  Eyes: Conjunctivae and EOM are normal. No scleral icterus.  Neck: Normal range of motion.  Pulmonary/Chest: Effort normal. No respiratory distress. He has no wheezes.  Respirations even and unlabored  Musculoskeletal: Normal range of motion. He exhibits tenderness.  TTP to posterior RLE and calf. No palpable cords. No BLE edema. Normal ROM of BLE.  Neurological: He is alert and oriented to person, place, and time.  Skin: Skin is warm and dry. No rash noted. He is not diaphoretic. No erythema. No pallor.  Psychiatric: He has a normal mood and affect. His behavior is normal.  Nursing note and vitals reviewed.    ED Treatments / Results  Labs (all labs ordered are listed, but only abnormal results are displayed) Labs Reviewed - No data to display  EKG  EKG Interpretation None       Radiology No results found.  Procedures Procedures (including critical care time)  Medications Ordered in ED Medications  oxyCODONE-acetaminophen (PERCOCET/ROXICET) 5-325 MG per tablet 1 tablet (1 tablet Oral Given 09/05/16 0343)     Initial Impression / Assessment and Plan / ED Course  I have  reviewed the triage vital signs and the nursing notes.  Pertinent labs & imaging results that were available during my care of the patient were reviewed by me and considered in my medical decision making (see chart for details).  Clinical Course     60 year old male presents to the emergency department for complaints of persistent right sided pleuritic chest pain. He has a history of rib fracture one month ago. X-ray on 09/01/2016 did not show any evidence of acute process. Patient is likely secondary to healing fractures. This is his third visit this week for similar complaints. Patient requesting pain medication. He was given 1 tablet of Percocet, but told that he will not be discharged with a narcotic prescription. I have explained to the patient that there is concern over him taking too much of this medication, especially given his history of alcohol abuse and frequent presentations with  intoxication.  Patient also complaining of new atraumatic right calf pain. There is some tenderness. No palpable cords or pitting edema. Unable to exclude DVT. Will hold for vascular ultrasound in the morning. Patient signed out to oncoming provider who will reassess and disposition appropriately.   Final Clinical Impressions(s) / ED Diagnoses   Final diagnoses:  Pleuritic chest pain  Right calf pain    New Prescriptions New Prescriptions   No medications on file     Antony Madura, PA-C 09/05/16 0535    Antony Madura, PA-C 09/05/16 0536    April Palumbo, MD 09/05/16 [phone]

## 2016-09-05 NOTE — Patient Outreach (Signed)
[facility] Care Management  09/05/2016  Lee PlanRobert [name] 27-Nov-1955 [id number]   CSW left message for patient today in regards to follow up needs for transportation to MD appointment later this month.  Will try again next week.   Reece LevyJanet Richie Bonanno, MSW, LCSW Clinical Social Worker  [facility] [phone]

## 2016-09-12 ENCOUNTER — Emergency Department (HOSPITAL_COMMUNITY)
Admission: EM | Admit: 2016-09-12 | Discharge: 2016-09-12 | Disposition: A | Discharge status: Home / Self Care | Payer: PPO | Attending: Emergency Medicine | Admitting: Emergency Medicine

## 2016-09-12 ENCOUNTER — Encounter (HOSPITAL_COMMUNITY): Payer: Self-pay | Admitting: Emergency Medicine

## 2016-09-12 DIAGNOSIS — F17210 Nicotine dependence, cigarettes, uncomplicated: Secondary | ICD-10-CM | POA: Diagnosis not present

## 2016-09-12 DIAGNOSIS — F1010 Alcohol abuse, uncomplicated: Secondary | ICD-10-CM | POA: Diagnosis not present

## 2016-09-12 DIAGNOSIS — F1020 Alcohol dependence, uncomplicated: Secondary | ICD-10-CM | POA: Diagnosis not present

## 2016-09-12 DIAGNOSIS — Z79899 Other long term (current) drug therapy: Secondary | ICD-10-CM | POA: Insufficient documentation

## 2016-09-12 DIAGNOSIS — R45851 Suicidal ideations: Secondary | ICD-10-CM

## 2016-09-12 DIAGNOSIS — Y908 Blood alcohol level of 240 mg/100 ml or more: Secondary | ICD-10-CM | POA: Diagnosis not present

## 2016-09-12 DIAGNOSIS — F332 Major depressive disorder, recurrent severe without psychotic features: Secondary | ICD-10-CM | POA: Diagnosis present

## 2016-09-12 LAB — COMPREHENSIVE METABOLIC PANEL
ALT: 70 U/L — ABNORMAL HIGH (ref 17–63)
ANION GAP: 12 (ref 5–15)
AST: 75 U/L — AB (ref 15–41)
Albumin: 3.7 g/dL (ref 3.5–5.0)
Alkaline Phosphatase: 91 U/L (ref 38–126)
BUN: 11 mg/dL (ref 6–20)
CHLORIDE: 108 mmol/L (ref 101–111)
CO2: 22 mmol/L (ref 22–32)
Calcium: 8.8 mg/dL — ABNORMAL LOW (ref 8.9–10.3)
Creatinine, Ser: 1.17 mg/dL (ref 0.61–1.24)
Glucose, Bld: 83 mg/dL (ref 65–99)
POTASSIUM: 4.6 mmol/L (ref 3.5–5.1)
Sodium: 142 mmol/L (ref 135–145)
Total Bilirubin: 0.5 mg/dL (ref 0.3–1.2)
Total Protein: 6.5 g/dL (ref 6.5–8.1)

## 2016-09-12 LAB — I-STAT CG4 LACTIC ACID, ED: LACTIC ACID, VENOUS: 2.05 mmol/L — AB (ref 0.5–1.9)

## 2016-09-12 LAB — RAPID URINE DRUG SCREEN, HOSP PERFORMED
AMPHETAMINES: NOT DETECTED
BARBITURATES: NOT DETECTED
BENZODIAZEPINES: NOT DETECTED
COCAINE: NOT DETECTED
Opiates: NOT DETECTED
Tetrahydrocannabinol: NOT DETECTED

## 2016-09-12 LAB — CBC WITH DIFFERENTIAL/PLATELET
Basophils Absolute: 0.1 10*3/uL (ref 0.0–0.1)
Basophils Relative: 1 %
Eosinophils Absolute: 0.1 10*3/uL (ref 0.0–0.7)
Eosinophils Relative: 1 %
HCT: 41.8 % (ref 39.0–52.0)
HEMOGLOBIN: 14.4 g/dL (ref 13.0–17.0)
LYMPHS ABS: 2.2 10*3/uL (ref 0.7–4.0)
LYMPHS PCT: 32 %
MCH: 34.2 pg — AB (ref 26.0–34.0)
MCHC: 34.4 g/dL (ref 30.0–36.0)
MCV: 99.3 fL (ref 78.0–100.0)
Monocytes Absolute: 0.7 10*3/uL (ref 0.1–1.0)
Monocytes Relative: 10 %
NEUTROS ABS: 3.8 10*3/uL (ref 1.7–7.7)
NEUTROS PCT: 56 %
Platelets: 202 10*3/uL (ref 150–400)
RBC: 4.21 MIL/uL — AB (ref 4.22–5.81)
RDW: 13 % (ref 11.5–15.5)
WBC: 6.7 10*3/uL (ref 4.0–10.5)

## 2016-09-12 LAB — ETHANOL: Alcohol, Ethyl (B): 252 mg/dL — ABNORMAL HIGH (ref <5)

## 2016-09-12 LAB — ACETAMINOPHEN LEVEL: Acetaminophen (Tylenol), Serum: 26 ug/mL (ref 10–30)

## 2016-09-12 LAB — INFLUENZA PANEL BY PCR (TYPE A & B)
INFLAPCR: NEGATIVE
INFLBPCR: NEGATIVE

## 2016-09-12 LAB — SALICYLATE LEVEL: SALICYLATE LVL: 22.4 mg/dL (ref 2.8–30.0)

## 2016-09-12 NOTE — ED Notes (Signed)
Pt given ice water, tolerated well.
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
## 2016-09-12 NOTE — ED Notes (Signed)
RN spoke with PA Greta DoomBowie, PA advised patient has signed contract for safety with pervious RN and he is medically cleared and [facility] intake has clear patient and patient to be discharge. PA advised [facility]Steven [facility]  [facility] for patient to go since Mr. Durene CalHunter has called and set up services. interrupter then called   Tresa EndoKelly Owns, with deaf services, and advised patient would be discharge and we were not transporting. Mrs. Owns then requested to speak with RN, Then RN spoke with Mrs Owns advised PA has discharge patient and they case manger would have to set up transportation. Mrs. Owns then [employer] Hunter the case manager, while RN was still on the phone. RN explained the same. Mr. Alto DenverHunt then states patient was IVC. RN advised Mr. Alto DenverHunt Patient not IVC. Patient has been medically cleared and the PA is dischargingand would he be available to come get the patient. Case manager states he could not transport. Case manager advised patient could go home but it would have to be cab . Patient states he would go home by cab. PA made aware. RN asked PA again if patient to be discharge, PA stated, " patient Medically and Psy cleared patient has signed contract for safety patient is to be discharge.

## 2016-09-12 NOTE — ED Provider Notes (Signed)
[facility]-EMERGENCY DEPT Provider Note   CSN: [id number] Arrival date & time: 09/12/16  1220     History   Chief Complaint Chief Complaint  Patient presents with  . Suicide Attempt    HPI Lee [name] is a 60 y.o. male.  HPI   60 year old male with hearing impairment, depression, anxiety, alcohol abuse, hepatitis brought here via GPD for further evaluation of suicidal ideation. History obtained through sign language interpreter via video conference. Patient report he is having suicidal ideation without any specific plan. He lost both of his son and daughter 2 years ago a month apart and normally around holiday time he become increasingly depressed. He is having fleeting suicidal thoughts. He admits that he has an alcohol addiction and has been drinking alcohol on a regular basis. Last use was today, a total of four 24 ounce beer. He also consumes a pack of cigarettes a day. He admits to feeling depressed but denies any homicidal ideation, auditory or visual hallucination. Report he has lack of appetite and doesn't sleep enough. No recent medication changes. According to GPD note, patient put a gun to his head yesterday and this but decided not to pull the trigger.  Also report of argument between him and his wife.  Pt however denies.    Past Medical History:  Diagnosis Date  . Anxiety   . Deaf   . Depression   . Hep C w/o coma, chronic [facility])     Patient Active Problem List   Diagnosis Date Noted  . Alcohol abuse with alcohol-induced mood disorder (HCC) 09/04/2016  . Pain in joint, ankle and foot   . Adjustment disorder with mixed anxiety and depressed mood   . Alcohol dependence with uncomplicated withdrawal (HCC)   . Alcohol dependence (HCC) 11/15/2015  . 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 NECK SURGERY         Home Medications    Prior to Admission medications     Medication Sig Start Date End Date Taking? Authorizing Provider  acetaminophen (TYLENOL) 500 MG tablet Take 1 tablet (500 mg total) by mouth every 6 (six) hours as needed. 09/05/16   Everlene Farrier, PA-C  citalopram (CELEXA) 40 MG tablet Take 0.5 tablets (20 mg total) by mouth daily. 09/04/16   Charm Rings, NP  hydrOXYzine (ATARAX/VISTARIL) 25 MG tablet Take 1 tablet (25 mg total) by mouth every 8 (eight) hours as needed for anxiety. Patient not taking: Reported on 09/05/2016 09/01/16   Tilden Fossa, MD  ibuprofen (ADVIL,MOTRIN) 200 MG tablet Take 800 mg by mouth every 6 (six) hours as needed for moderate pain.    Historical Provider, MD  lidocaine (LIDODERM) 5 % Place 1 patch onto the skin daily. Remove & Discard patch within 12 hours or as directed by MD Patient not taking: Reported on 09/05/2016 09/01/16   Tilden Fossa, MD  traZODone (DESYREL) 100 MG tablet Take 1 tablet (100 mg total) by mouth at bedtime as needed for sleep. 09/04/16   Charm Rings, NP    Family History No family history on file.  Social History Social History  Substance Use Topics  . Smoking status: Current Every Day Smoker    Packs/day: 1.00    Types: Cigarettes  . Smokeless tobacco: Never Used  . Alcohol use Yes     Comment: When he gets money, been drinking for about 9 days straight. 4-24oz beers in a  day. Last drink: 20:00     Allergies   Patient has no known allergies.   Review of Systems Review of Systems  All other systems reviewed and are negative.    Physical Exam Updated Vital Signs Ht 5\' 9"  (1.753 m)   Wt 72.6 kg   BMI 23.63 kg/m   Physical Exam  Constitutional: He appears well-developed and well-nourished. No distress.  HENT:  Head: Atraumatic.  Eyes: Conjunctivae are normal.  Neck: Neck supple.  Cardiovascular: Normal rate and regular rhythm.   Pulmonary/Chest: Effort normal and breath sounds normal.  Abdominal: Soft. There is no tenderness.  Neurological: He is alert. GCS eye  subscore is 4. GCS verbal subscore is 1. GCS motor subscore is 6.  Skin: No rash noted.  Psychiatric: His behavior is normal. Thought content is not paranoid. Cognition and memory are normal. He exhibits a depressed mood. He expresses suicidal ideation. He expresses no homicidal ideation. He is noncommunicative.  Nursing note and vitals reviewed.    ED Treatments / Results  Labs (all labs ordered are listed, but only abnormal results are displayed) Labs Reviewed  COMPREHENSIVE METABOLIC PANEL - Abnormal; Notable for the following:       Result Value   Calcium 8.8 (*)    AST 75 (*)    ALT 70 (*)    All other components within normal limits  CBC WITH DIFFERENTIAL/PLATELET - Abnormal; Notable for the following:    RBC 4.21 (*)    MCH 34.2 (*)    All other components within normal limits  ETHANOL - Abnormal; Notable for the following:    Alcohol, Ethyl (B) 252 (*)    All other components within normal limits  I-STAT CG4 LACTIC ACID, ED - Abnormal; Notable for the following:    Lactic Acid, Venous 2.05 (*)    All other components within normal limits  NASAL CULTURE (N/P)  SALICYLATE LEVEL  ACETAMINOPHEN LEVEL  RAPID URINE DRUG SCREEN, HOSP PERFORMED  INFLUENZA PANEL BY PCR (TYPE A & B, H1N1)    EKG  EKG Interpretation None       Radiology No results found.  Procedures Procedures (including critical care time)  Medications Ordered in ED Medications - No data to display   Initial Impression / Assessment and Plan / ED Course  I have reviewed the triage vital signs and the nursing notes.  Pertinent labs & imaging results that were available during my care of the patient were reviewed by me and considered in my medical decision making (see chart for details).  Clinical Course     BP 140/74   Pulse (!) 49   Temp 98.8 F (37.1 C) (Oral)   Resp 16   Ht 5\' 9"  (1.753 m)   Wt 72.6 kg   SpO2 91%   BMI 23.63 kg/m    Final Clinical Impressions(s) / ED Diagnoses    Final diagnoses:  Suicidal ideation  Alcohol abuse    New Prescriptions New Prescriptions   No medications on file   1:04 PM Pt who is deaf presents requesting help with SI but without a specific plan.  He does abuse alcohol.  Will perform medical screening and will consult TTS for further psychiatric assessment once pt is medically cleared.    2:04 PM Mildly elevated lactic acid of 2.05, likely from prolonged alcohol use.  Doubt infectious cause.    3:45 PM Pt is medically cleared and clinically sober.  Will consult TTS for further assessment.  A gentlemen by the name of Annice PihSteven Hunter, who is a regional care coordinator of the Deaf and Hard of Hearing Program is present at the patient's behalf.  He expressed that pt has a place that he can be transfer to for further care.  The address is   [facility] [address]  [place], [place]  [id number]  Care discussed with Dr. Erma HeritageIsaacs.   4:25 PM TTS have evaluated pt.  Pt agrees to sign Engineer, manufacturing systemssafety contract.  He will be [facility] for further management of his condition.  An MRSA and flu test were obtain per request of the facility.     Fayrene HelperBowie Nareg Breighner, PA-C 09/12/16 1626    Shaune Pollackameron Isaacs, MD 09/13/16 [phone]

## 2016-09-12 NOTE — Consult Note (Signed)
Telepsych Consultation   Reason for Consult:  Suicidal ideation Referring Physician:  EDP Patient Identification: Lee [name] MRN:  [id number] Principal Diagnosis:Severe recurrent major depression without psychotic features [facility])   Diagnosis:   Patient Active Problem List   Diagnosis Date Noted  . Suicidal ideation [R45.851]   . Alcohol abuse with alcohol-induced mood disorder (Lee [name]) [F10.14] 09/04/2016  . Pain in joint, ankle and foot [M25.579]   . Adjustment disorder with mixed anxiety and depressed mood [F43.23]   . Alcohol dependence with uncomplicated withdrawal (Lee [name]) [F10.230]   . Alcohol dependence ([place]) [F10.20] 11/15/2015  . Severe recurrent major depression without psychotic features (Lee [name]) [F33.2] 11/15/2015    Total Time spent with patient: 15 minutes  Subjective:   IMRI LOR is a 60 y.o. male patient admitted with alcohol intoxication and complaints of pain in his liver.    HPI:  Per tele assessment note on chart written by Lee [name], [facility] Counselor: Lee [name] is an 60 y.o. male with history of anxiety and depression. He presents to [facility] via EMS called by his spouse. Sts that his spouse called EMS because he was in pain. Sts, "My liver is hurting me". Per ED notes, spouse sts that patient has threatened to kill himself". Patient denies every making a suicidal comment or statement. He denies intentions of self harm. He denies history of harm to self. No current or history of self mutilating behaviors. He does admit to depressive symptoms including loss of interest in usual pleasures, fatigue, and crying spells. Appetite is good. Patient requesting food during the assessment. His sleep patterns are erratic. Sts, "I sleep throughout the day 2-3 hrs at a time."  He does not not have a family history of mental health illness. No history of abuse. No HI. He has a previous history of aggressive behavior (40+ yrs ago). Since he has not had any aggressive issues. No legal  issues. No AVH's. Patient has received INPT substance abuse treatment at [facility], [place], and "another facility but I can't remember the name". No outpatient therapist or psychiatrist. Patient sts, "I don't have transportation to get to any outpatient providers".   Today during tele psych consult: Lee [name] is a 60 year old male who [facility] with a BAL of 252 after ingesting too much alcohol. Pt was calm and cooperative, alert & oriented and appropriate. This Probation officer spoke with Pt through a patient advocate and sign language interpreter due to Pt is deaf.  Pt stated he still feels like he wants to die but it when he drinks too much and thinks about his son and daughter who are both deceased. Pt stated he does not want to hurt others and does have auditory or visual hallucinations. Pt stated he gets messed up in his mind when he drinks too much and is depressed about his children being dead. Pt was calm and cooperative, alert & oriented and appropriate for the situation. Pt was able to contract for safety with this Probation officer. Pt has been accepted to an inpatient deaf unit at [facility]. Pt is willing to go to this facility voluntarily to get help for his depression and alcoholism.  Discussed case with Dr Parke Poisson who is in agreement that pt can be dischaged to [facility] when medically cleared.    Past Psychiatric History: alcohol abuse, depression,deafness, anxiety  Risk to Self: Is patient at risk for suicide?: Yes Risk to Others:  No Prior Inpatient Therapy:  Yes Prior  Outpatient Therapy:  Yes  Past Medical History:  Past Medical History:  Diagnosis Date  . Anxiety   . Deaf   . Depression   . Hep C w/o coma, chronic (HCC)     Past Surgical History:  Procedure Laterality Date  . NECK SURGERY     Family History: No family history on file. Family Psychiatric  History: Unknown Social History:  History  Alcohol Use  . Yes    Comment: When he gets money,  been drinking for about 9 days straight. 4-24oz beers in a day. Last drink: 20:00     History  Drug Use No    Social History   Social History  . Marital status: Divorced    Spouse name: N/A  . Number of children: N/A  . Years of education: N/A   Social History Main Topics  . Smoking status: Current Every Day Smoker    Packs/day: 1.00    Types: Cigarettes  . Smokeless tobacco: Never Used  . Alcohol use Yes     Comment: When he gets money, been drinking for about 9 days straight. 4-24oz beers in a day. Last drink: 20:00  . Drug use: No  . Sexual activity: Not Asked   Other Topics Concern  . None   Social History Narrative  . None   Additional Social History:    Allergies:  No Known Allergies  Labs:  Results for orders placed or performed during the hospital encounter of 09/12/16 (from the past 48 hour(s))  Comprehensive metabolic panel     Status: Abnormal   Collection Time: 09/12/16  1:05 PM  Result Value Ref Range   Sodium 142 135 - 145 mmol/L   Potassium 4.6 3.5 - 5.1 mmol/L   Chloride 108 101 - 111 mmol/L   CO2 22 22 - 32 mmol/L   Glucose, Bld 83 65 - 99 mg/dL   BUN 11 6 - 20 mg/dL   Creatinine, Ser 1.17 0.61 - 1.24 mg/dL   Calcium 8.8 (L) 8.9 - 10.3 mg/dL   Total Protein 6.5 6.5 - 8.1 g/dL   Albumin 3.7 3.5 - 5.0 g/dL   AST 75 (H) 15 - 41 U/L   ALT 70 (H) 17 - 63 U/L   Alkaline Phosphatase 91 38 - 126 U/L   Total Bilirubin 0.5 0.3 - 1.2 mg/dL   GFR calc non Af Amer >60 >60 mL/min   GFR calc Af Amer >60 >60 mL/min    Comment: (NOTE) The eGFR has been calculated using the CKD EPI equation. This calculation has not been validated in all clinical situations. eGFR's persistently <60 mL/min signify possible Chronic Kidney Disease.    Anion gap 12 5 - 15  CBC with Differential     Status: Abnormal   Collection Time: 09/12/16  1:05 PM  Result Value Ref Range   WBC 6.7 4.0 - 10.5 K/uL   RBC 4.21 (L) 4.22 - 5.81 MIL/uL   Hemoglobin 14.4 13.0 - 17.0 g/dL    HCT 41.8 39.0 - 52.0 %   MCV 99.3 78.0 - 100.0 fL   MCH 34.2 (H) 26.0 - 34.0 pg   MCHC 34.4 30.0 - 36.0 g/dL   RDW 13.0 11.5 - 15.5 %   Platelets 202 150 - 400 K/uL   Neutrophils Relative % 56 %   Neutro Abs 3.8 1.7 - 7.7 K/uL   Lymphocytes Relative 32 %   Lymphs Abs 2.2 0.7 - 4.0 K/uL   Monocytes Relative 10 %  Monocytes Absolute 0.7 0.1 - 1.0 K/uL   Eosinophils Relative 1 %   Eosinophils Absolute 0.1 0.0 - 0.7 K/uL   Basophils Relative 1 %   Basophils Absolute 0.1 0.0 - 0.1 K/uL  Ethanol     Status: Abnormal   Collection Time: 09/12/16  1:05 PM  Result Value Ref Range   Alcohol, Ethyl (B) 252 (H) <5 mg/dL    Comment:        LOWEST DETECTABLE LIMIT FOR SERUM ALCOHOL IS 5 mg/dL FOR MEDICAL PURPOSES ONLY   Salicylate level     Status: None   Collection Time: 09/12/16  1:05 PM  Result Value Ref Range   Salicylate Lvl 16.1 2.8 - 30.0 mg/dL  Acetaminophen level     Status: None   Collection Time: 09/12/16  1:05 PM  Result Value Ref Range   Acetaminophen (Tylenol), Serum 26 10 - 30 ug/mL    Comment:        THERAPEUTIC CONCENTRATIONS VARY SIGNIFICANTLY. A RANGE OF 10-30 ug/mL MAY BE AN EFFECTIVE CONCENTRATION FOR MANY PATIENTS. HOWEVER, SOME ARE BEST TREATED AT CONCENTRATIONS OUTSIDE THIS RANGE. ACETAMINOPHEN CONCENTRATIONS >150 ug/mL AT 4 HOURS AFTER INGESTION AND >50 ug/mL AT 12 HOURS AFTER INGESTION ARE OFTEN ASSOCIATED WITH TOXIC REACTIONS.   I-Stat CG4 Lactic Acid, ED     Status: Abnormal   Collection Time: 09/12/16  1:50 PM  Result Value Ref Range   Lactic Acid, Venous 2.05 (HH) 0.5 - 1.9 mmol/L   Comment NOTIFIED PHYSICIAN     No current facility-administered medications for this encounter.    Current Outpatient Prescriptions  Medication Sig Dispense Refill  . acetaminophen (TYLENOL) 500 MG tablet Take 1 tablet (500 mg total) by mouth every 6 (six) hours as needed. 30 tablet 0  . citalopram (CELEXA) 40 MG tablet Take 0.5 tablets (20 mg total) by mouth  daily. 30 tablet 0  . hydrOXYzine (ATARAX/VISTARIL) 25 MG tablet Take 1 tablet (25 mg total) by mouth every 8 (eight) hours as needed for anxiety. (Patient not taking: Reported on 09/05/2016) 10 tablet 0  . ibuprofen (ADVIL,MOTRIN) 200 MG tablet Take 800 mg by mouth every 6 (six) hours as needed for moderate pain.    Marland Kitchen lidocaine (LIDODERM) 5 % Place 1 patch onto the skin daily. Remove & Discard patch within 12 hours or as directed by MD (Patient not taking: Reported on 09/05/2016) 10 patch 0  . traZODone (DESYREL) 100 MG tablet Take 1 tablet (100 mg total) by mouth at bedtime as needed for sleep. 30 tablet 0    Musculoskeletal: Unable to assess: camera  Psychiatric Specialty Exam: Physical Exam  Review of Systems  Psychiatric/Behavioral: Positive for depression, substance abuse and suicidal ideas. Negative for hallucinations and memory loss. The patient is not nervous/anxious and does not have insomnia.     Blood pressure 140/74, pulse (!) 49, temperature 98.8 F (37.1 C), temperature source Oral, resp. rate 16, height 5' 9"  (1.753 m), weight 72.6 kg (160 lb), SpO2 91 %.Body mass index is 23.63 kg/m.  General Appearance: Disheveled  Eye Contact:  Good  Speech:  Pt is deaf, sign language interpreter present  Volume:  Deaf, sign language interpreter  Mood:  Depressed and Hopeless  Affect:  Congruent, Depressed and Flat  Thought Process:  Coherent, Goal Directed and Linear  Orientation:  Full (Time, Place, and Person)  Thought Content:  Logical  Suicidal Thoughts:  Yes.  without intent/plan  Homicidal Thoughts:  No  Memory:  Immediate;  Good Recent;   Good Remote;   Fair  Judgement:  Fair  Insight:  Fair  Psychomotor Activity:  Normal  Concentration:  Concentration: Good and Attention Span: Good  Recall:  Good  Fund of Knowledge:  Good  Language:  deaf, sign language  Akathisia:  No  Handed:  Right  AIMS (if indicated):     Assets:  Communication Skills Desire for  Improvement Financial Resources/Insurance Housing Resilience Social Support Transportation  ADL's:  Intact  Cognition:  WNL  Sleep:        Treatment Plan Summary: Pt has a bed at [facility]  deaf unit for treatment of substance abuse and depression.   Disposition: Recommend psychiatric Inpatient admission when medically cleared.    Ethelene Hal, NP 09/12/2016 3:43 PM

## 2016-09-12 NOTE — ED Notes (Signed)
Patient given a cab voucher,

## 2016-09-12 NOTE — ED Triage Notes (Signed)
Pt has had 4 24oz Edge beers.

## 2016-09-12 NOTE — Discharge Instructions (Signed)
You will receive additional care from [facility] for further management of your condition.  We wish you the best of luck.

## 2016-09-12 NOTE — ED Triage Notes (Signed)
Son died sept 2, daughter died October 31 of 2014. 1 year apart. 2 months ago he was in the hospital for drinking.  He cant get it off of his mind.

## 2016-09-12 NOTE — ED Notes (Signed)
Patient given sprite. Sitter at bedside. Patient clam , not complaints at this time.

## 2016-09-15 LAB — NASAL CULTURE (N/P): Culture: NORMAL

## 2016-09-18 ENCOUNTER — Encounter (HOSPITAL_COMMUNITY): Payer: Self-pay | Admitting: *Deleted

## 2016-09-18 ENCOUNTER — Emergency Department (HOSPITAL_COMMUNITY)
Admission: EM | Admit: 2016-09-18 | Discharge: 2016-09-20 | Disposition: A | Discharge status: Other Acute Inpatient Hospital | Payer: PPO | Attending: Emergency Medicine | Admitting: Emergency Medicine

## 2016-09-18 DIAGNOSIS — R45851 Suicidal ideations: Secondary | ICD-10-CM

## 2016-09-18 DIAGNOSIS — Z79899 Other long term (current) drug therapy: Secondary | ICD-10-CM | POA: Diagnosis not present

## 2016-09-18 DIAGNOSIS — F1099 Alcohol use, unspecified with unspecified alcohol-induced disorder: Secondary | ICD-10-CM | POA: Diagnosis not present

## 2016-09-18 DIAGNOSIS — Y908 Blood alcohol level of 240 mg/100 ml or more: Secondary | ICD-10-CM | POA: Diagnosis not present

## 2016-09-18 DIAGNOSIS — R0789 Other chest pain: Secondary | ICD-10-CM | POA: Insufficient documentation

## 2016-09-18 DIAGNOSIS — F332 Major depressive disorder, recurrent severe without psychotic features: Secondary | ICD-10-CM | POA: Diagnosis present

## 2016-09-18 DIAGNOSIS — F32A Depression, unspecified: Secondary | ICD-10-CM

## 2016-09-18 DIAGNOSIS — F1020 Alcohol dependence, uncomplicated: Secondary | ICD-10-CM | POA: Insufficient documentation

## 2016-09-18 DIAGNOSIS — F329 Major depressive disorder, single episode, unspecified: Secondary | ICD-10-CM

## 2016-09-18 DIAGNOSIS — F17210 Nicotine dependence, cigarettes, uncomplicated: Secondary | ICD-10-CM | POA: Diagnosis not present

## 2016-09-18 DIAGNOSIS — Z9889 Other specified postprocedural states: Secondary | ICD-10-CM | POA: Diagnosis not present

## 2016-09-18 DIAGNOSIS — F333 Major depressive disorder, recurrent, severe with psychotic symptoms: Secondary | ICD-10-CM | POA: Diagnosis not present

## 2016-09-18 LAB — COMPREHENSIVE METABOLIC PANEL
ALBUMIN: 4.1 g/dL (ref 3.5–5.0)
ALK PHOS: 80 U/L (ref 38–126)
ALT: 46 U/L (ref 17–63)
ANION GAP: 10 (ref 5–15)
AST: 55 U/L — ABNORMAL HIGH (ref 15–41)
BUN: 13 mg/dL (ref 6–20)
CHLORIDE: 109 mmol/L (ref 101–111)
CO2: 21 mmol/L — AB (ref 22–32)
Calcium: 8.9 mg/dL (ref 8.9–10.3)
Creatinine, Ser: 1 mg/dL (ref 0.61–1.24)
GFR calc non Af Amer: >60 mL/min (ref >60)
GLUCOSE: 90 mg/dL (ref 65–99)
Potassium: 3.9 mmol/L (ref 3.5–5.1)
SODIUM: 140 mmol/L (ref 135–145)
Total Bilirubin: 0.6 mg/dL (ref 0.3–1.2)
Total Protein: 7.6 g/dL (ref 6.5–8.1)

## 2016-09-18 LAB — RAPID URINE DRUG SCREEN, HOSP PERFORMED
AMPHETAMINES: NOT DETECTED
BARBITURATES: NOT DETECTED
BENZODIAZEPINES: NOT DETECTED
Cocaine: NOT DETECTED
Opiates: NOT DETECTED
TETRAHYDROCANNABINOL: NOT DETECTED

## 2016-09-18 LAB — CBC
HEMATOCRIT: 39.9 % (ref 39.0–52.0)
Hemoglobin: 13.8 g/dL (ref 13.0–17.0)
MCH: 34 pg (ref 26.0–34.0)
MCHC: 34.6 g/dL (ref 30.0–36.0)
MCV: 98.3 fL (ref 78.0–100.0)
Platelets: 211 10*3/uL (ref 150–400)
RBC: 4.06 MIL/uL — AB (ref 4.22–5.81)
RDW: 13.6 % (ref 11.5–15.5)
WBC: 9.2 10*3/uL (ref 4.0–10.5)

## 2016-09-18 LAB — SALICYLATE LEVEL: SALICYLATE LVL: 10.5 mg/dL (ref 2.8–30.0)

## 2016-09-18 LAB — ACETAMINOPHEN LEVEL

## 2016-09-18 LAB — ETHANOL: Alcohol, Ethyl (B): 247 mg/dL — ABNORMAL HIGH (ref <5)

## 2016-09-18 MED ORDER — THIAMINE HCL 100 MG/ML IJ SOLN: 100.0000 mg | Freq: Every day | INTRAMUSCULAR | Status: DC

## 2016-09-18 MED ORDER — LORAZEPAM 1 MG PO TABS: 1.0000 mg | ORAL_TABLET | Freq: Four times a day (QID) | ORAL | Status: DC | PRN

## 2016-09-18 MED ORDER — LORAZEPAM 1 MG PO TABS
0.0000 mg | ORAL_TABLET | Freq: Two times a day (BID) | ORAL | Status: DC
Start: 2016-09-20 — End: 2016-09-20

## 2016-09-18 MED ORDER — ADULT MULTIVITAMIN W/MINERALS CH
1.0000 | ORAL_TABLET | Freq: Every day | ORAL | Status: DC
  Administered 2016-09-18 – 2016-09-20 (×3): 1 via ORAL
  Filled 2016-09-18 (×3): qty 1

## 2016-09-18 MED ORDER — CITALOPRAM HYDROBROMIDE 10 MG PO TABS
10.0000 mg | ORAL_TABLET | Freq: Every day | ORAL | Status: DC
  Administered 2016-09-18 – 2016-09-19 (×2): 10 mg via ORAL
  Filled 2016-09-18 (×2): qty 1

## 2016-09-18 MED ORDER — TRAZODONE HCL 100 MG PO TABS
100.0000 mg | ORAL_TABLET | Freq: Every evening | ORAL | Status: DC | PRN
  Administered 2016-09-19: 100 mg via ORAL
  Filled 2016-09-18: qty 1

## 2016-09-18 MED ORDER — IBUPROFEN 200 MG PO TABS
600.0000 mg | ORAL_TABLET | Freq: Three times a day (TID) | ORAL | Status: DC | PRN
  Administered 2016-09-18 – 2016-09-19 (×3): 600 mg via ORAL
  Filled 2016-09-18 (×3): qty 3

## 2016-09-18 MED ORDER — VITAMIN B-1 100 MG PO TABS
100.0000 mg | ORAL_TABLET | Freq: Every day | ORAL | Status: DC
  Administered 2016-09-18 – 2016-09-20 (×3): 100 mg via ORAL
  Filled 2016-09-18 (×3): qty 1

## 2016-09-18 MED ORDER — FOLIC ACID 1 MG PO TABS
1.0000 mg | ORAL_TABLET | Freq: Every day | ORAL | Status: DC
  Administered 2016-09-18 – 2016-09-20 (×3): 1 mg via ORAL
  Filled 2016-09-18 (×3): qty 1

## 2016-09-18 MED ORDER — ALUM & MAG HYDROXIDE-SIMETH 200-200-20 MG/5ML PO SUSP: 30.0000 mL | ORAL | Status: DC | PRN

## 2016-09-18 MED ORDER — ACETAMINOPHEN 325 MG PO TABS: 650.0000 mg | ORAL_TABLET | ORAL | Status: DC | PRN

## 2016-09-18 MED ORDER — ZOLPIDEM TARTRATE 5 MG PO TABS: 5.0000 mg | ORAL_TABLET | Freq: Every evening | ORAL | Status: DC | PRN

## 2016-09-18 MED ORDER — LORAZEPAM 1 MG PO TABS
0.0000 mg | ORAL_TABLET | Freq: Four times a day (QID) | ORAL | Status: DC
  Administered 2016-09-18 – 2016-09-20 (×6): 1 mg via ORAL
  Filled 2016-09-18 (×6): qty 1

## 2016-09-18 MED ORDER — LORAZEPAM 1 MG PO TABS
1.0000 mg | ORAL_TABLET | Freq: Three times a day (TID) | ORAL | Status: DC | PRN
  Administered 2016-09-18: 1 mg via ORAL
  Filled 2016-09-18: qty 1

## 2016-09-18 MED ORDER — ONDANSETRON HCL 4 MG PO TABS: 4.0000 mg | ORAL_TABLET | Freq: Three times a day (TID) | ORAL | Status: DC | PRN

## 2016-09-18 MED ORDER — LORAZEPAM 2 MG/ML IJ SOLN: 1.0000 mg | Freq: Four times a day (QID) | INTRAMUSCULAR | Status: DC | PRN
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
## 2016-09-18 NOTE — ED Notes (Signed)
Patient has an interpreter service.  [facility] and ask for interpreter coordinator.  The number is [phone].  [facility] [place], [place].  Interpreter has been here majority of the day.
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
## 2016-09-18 NOTE — Progress Notes (Signed)
CSW received call from [place] with [facility]. Patient is now on their wait list.   Stacy GardnerErin Mare Ludtke, [facility] Clinical Social Worker [phone]

## 2016-09-18 NOTE — Progress Notes (Signed)
[facility] results from patients chest x-ray on 12/03. CSW will fax results.   Stacy GardnerErin Roarke Marciano, LCSWA Clinical Social Worker [phone]

## 2016-09-18 NOTE — Progress Notes (Signed)
09/18/16 1355:  With the assistance of interpreter, LRT introduced self to pt and offered activities.  Pt wanted to know the type of activities offered so LRT explained to pt what was available.  Pt expressed that he recognized LRT from across the street.  Pt was not interested in activities at the moment but encouraged to let LRT know if he changes his mind about doing an activity.   Caroll RancherMarjette Reeanna Acri, LRT/CTRS

## 2016-09-18 NOTE — ED Triage Notes (Signed)
Patient states he is going to slit his wrists. Patient is complaining of right sided flank pain. Patient is agitated and has severe anxiety.

## 2016-09-18 NOTE — ED Notes (Signed)
Patient seen resting in his room. Verbalizes no concern. Denies pain, SI/HI, AH/VH at this time. No behavioral issues.  Patient requested for snacks and drinks.  NB: Communication is via signs and patient reading my lips. Will continue to monitor patient.
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
## 2016-09-18 NOTE — ED Notes (Signed)
Waiting for SAPU to look over chart so patient can be moved back.

## 2016-09-18 NOTE — BH Assessment (Addendum)
[facility] Assessment Progress Note  Per [name], DNP, this pt requires psychiatric hospitalization at this time.  Pt is requesting to be [facility] for specialty programming for the deaf, owing to the fact that the pt is deaf and relies on American Sign Language for communication.  At 13:58 this Clinical research associatewriter [facility] the [facility] and spoke to [name].  She authorizes state hospital referral, authorization [id number] from 09/18/2016 - 09/24/2016.  Please note that authorization does not mean that pt has been accepted to the facility.  I then called [facility] and spoke to [name] for procedural guidance.  She reports [place], would need to be [facility] because he lives in their catchment area.  After they have reviewed the case they will make the referral to [facility] if they determine that they are not able to provide appropriate treatment for him.  [name] then took demographic information.  As of this writing, referral information is in the process of being faxed to [facility] and decision is pending.  [name], MA Triage Specialist [phone]   Addendum:  At 16:08 [name] confirms receipt of referral information.  Decision is pending.  [name], MA Triage Specialist [phone]

## 2016-09-19 DIAGNOSIS — F332 Major depressive disorder, recurrent severe without psychotic features: Secondary | ICD-10-CM | POA: Diagnosis not present

## 2016-09-19 DIAGNOSIS — R45851 Suicidal ideations: Secondary | ICD-10-CM

## 2016-09-19 DIAGNOSIS — F1020 Alcohol dependence, uncomplicated: Secondary | ICD-10-CM

## 2016-09-19 DIAGNOSIS — Z79899 Other long term (current) drug therapy: Secondary | ICD-10-CM

## 2016-09-19 DIAGNOSIS — Z9889 Other specified postprocedural states: Secondary | ICD-10-CM

## 2016-09-19 DIAGNOSIS — F17210 Nicotine dependence, cigarettes, uncomplicated: Secondary | ICD-10-CM

## 2016-09-19 MED ORDER — CITALOPRAM HYDROBROMIDE 10 MG PO TABS
20.0000 mg | ORAL_TABLET | Freq: Every day | ORAL | Status: DC
  Administered 2016-09-20: 20 mg via ORAL
  Filled 2016-09-19: qty 2

## 2016-09-19 NOTE — Progress Notes (Signed)
09/19/16 1343:  LRT went to pt room for activities, pt was sleep.  Caroll RancherMarjette Jahmir Salo, LRT/CTRS

## 2016-09-19 NOTE — ED Notes (Addendum)
Vital sign rechecked - see record. Patient complained of right rib cage pain of 7/10. Accepted Ibuprofen 650 mg per ordered.

## 2016-09-19 NOTE — ED Notes (Signed)
Patient seen in his room awake. Verbalizes no concern. Denies pain, SI/HI, AH/VH at this time. No behavioral issues noted.  Communication is via signs and patient reading my lips. Responds by nodding "yes or no".

## 2016-09-19 NOTE — [facility] Counselor (Signed)
Dr. Jannifer FranklinAkintayo & [name], DNP, continue to recommend inpatint treatment. Patient requesting services at [facility]  (deaf [facility]). TTS unable to directly refer patient to this unit due to [facility] being out of the catchment area. [facility] is the catchment area for this patient. Pt must be [facility] first and [facility] staff determine where patient is to be placed. [facility] made aware that patient strongly prefers [facility] because they have a specialized unit for individuals that ar deaf. Despite informing [facility] of patient's request they are recommending that patient is referred to [facility] due to withdrawal symptoms.  Faxed referral to [facility]; pending review.  Berkley HarveyAuth #[id number] from 12/20 - 12/26; decision pending.
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
## 2016-09-20 ENCOUNTER — Inpatient Hospital Stay (HOSPITAL_COMMUNITY)
Admission: AD | Admit: 2016-09-20 | Discharge: 2016-09-27 | DRG: 885 | Disposition: A | Discharge status: Other Acute Inpatient Hospital | Payer: PPO | Attending: Psychiatry | Admitting: Psychiatry

## 2016-09-20 ENCOUNTER — Encounter (HOSPITAL_COMMUNITY): Payer: Self-pay

## 2016-09-20 DIAGNOSIS — Z590 Homelessness: Secondary | ICD-10-CM | POA: Diagnosis not present

## 2016-09-20 DIAGNOSIS — Z79899 Other long term (current) drug therapy: Secondary | ICD-10-CM

## 2016-09-20 DIAGNOSIS — Z634 Disappearance and death of family member: Secondary | ICD-10-CM | POA: Diagnosis not present

## 2016-09-20 DIAGNOSIS — F332 Major depressive disorder, recurrent severe without psychotic features: Secondary | ICD-10-CM | POA: Diagnosis present

## 2016-09-20 DIAGNOSIS — B182 Chronic viral hepatitis C: Secondary | ICD-10-CM | POA: Diagnosis not present

## 2016-09-20 DIAGNOSIS — F17200 Nicotine dependence, unspecified, uncomplicated: Secondary | ICD-10-CM | POA: Diagnosis not present

## 2016-09-20 DIAGNOSIS — F1020 Alcohol dependence, uncomplicated: Secondary | ICD-10-CM | POA: Diagnosis not present

## 2016-09-20 DIAGNOSIS — Z638 Other specified problems related to primary support group: Secondary | ICD-10-CM

## 2016-09-20 DIAGNOSIS — F10229 Alcohol dependence with intoxication, unspecified: Secondary | ICD-10-CM | POA: Diagnosis not present

## 2016-09-20 DIAGNOSIS — H9190 Unspecified hearing loss, unspecified ear: Secondary | ICD-10-CM | POA: Diagnosis present

## 2016-09-20 DIAGNOSIS — G894 Chronic pain syndrome: Secondary | ICD-10-CM | POA: Diagnosis not present

## 2016-09-20 DIAGNOSIS — Z23 Encounter for immunization: Secondary | ICD-10-CM | POA: Diagnosis not present

## 2016-09-20 DIAGNOSIS — F10239 Alcohol dependence with withdrawal, unspecified: Secondary | ICD-10-CM | POA: Diagnosis not present

## 2016-09-20 DIAGNOSIS — F17210 Nicotine dependence, cigarettes, uncomplicated: Secondary | ICD-10-CM | POA: Diagnosis not present

## 2016-09-20 DIAGNOSIS — I10 Essential (primary) hypertension: Secondary | ICD-10-CM | POA: Diagnosis not present

## 2016-09-20 DIAGNOSIS — F10230 Alcohol dependence with withdrawal, uncomplicated: Secondary | ICD-10-CM | POA: Diagnosis not present

## 2016-09-20 DIAGNOSIS — F333 Major depressive disorder, recurrent, severe with psychotic symptoms: Secondary | ICD-10-CM | POA: Diagnosis not present

## 2016-09-20 DIAGNOSIS — F4323 Adjustment disorder with mixed anxiety and depressed mood: Secondary | ICD-10-CM | POA: Diagnosis not present

## 2016-09-20 DIAGNOSIS — R45851 Suicidal ideations: Secondary | ICD-10-CM | POA: Diagnosis not present

## 2016-09-20 MED ORDER — TRAZODONE HCL 100 MG PO TABS
100.0000 mg | ORAL_TABLET | Freq: Every evening | ORAL | Status: DC | PRN
  Administered 2016-09-20 – 2016-09-26 (×7): 100 mg via ORAL
  Filled 2016-09-20 (×7): qty 1

## 2016-09-20 MED ORDER — ONDANSETRON HCL 4 MG PO TABS: 4.0000 mg | ORAL_TABLET | Freq: Three times a day (TID) | ORAL | Status: DC | PRN

## 2016-09-20 MED ORDER — MAGNESIUM HYDROXIDE 400 MG/5ML PO SUSP: 30.0000 mL | Freq: Every day | ORAL | Status: DC | PRN

## 2016-09-20 MED ORDER — LORAZEPAM 1 MG PO TABS
1.0000 mg | ORAL_TABLET | Freq: Every day | ORAL | Status: AC
  Administered 2016-09-24: 1 mg via ORAL
  Filled 2016-09-20: qty 1

## 2016-09-20 MED ORDER — NICOTINE 21 MG/24HR TD PT24
21.0000 mg | MEDICATED_PATCH | Freq: Every day | TRANSDERMAL | Status: DC
  Administered 2016-09-21 – 2016-09-27 (×7): 21 mg via TRANSDERMAL
  Filled 2016-09-20 (×9): qty 1

## 2016-09-20 MED ORDER — LORAZEPAM 1 MG PO TABS: 0.0000 mg | ORAL_TABLET | Freq: Two times a day (BID) | ORAL | Status: DC

## 2016-09-20 MED ORDER — ADULT MULTIVITAMIN W/MINERALS CH
1.0000 | ORAL_TABLET | Freq: Every day | ORAL | Status: DC
  Filled 2016-09-20: qty 1

## 2016-09-20 MED ORDER — LORAZEPAM 1 MG PO TABS
1.0000 mg | ORAL_TABLET | Freq: Four times a day (QID) | ORAL | Status: AC | PRN
  Administered 2016-09-22: 1 mg via ORAL
  Filled 2016-09-20: qty 1

## 2016-09-20 MED ORDER — LORAZEPAM 1 MG PO TABS
1.0000 mg | ORAL_TABLET | Freq: Two times a day (BID) | ORAL | Status: AC
Start: 2016-09-23 — End: 2016-09-23
  Administered 2016-09-23 (×2): 1 mg via ORAL
  Filled 2016-09-20 (×2): qty 1

## 2016-09-20 MED ORDER — ALUM & MAG HYDROXIDE-SIMETH 200-200-20 MG/5ML PO SUSP: 30.0000 mL | ORAL | Status: DC | PRN

## 2016-09-20 MED ORDER — INFLUENZA VAC SPLIT QUAD 0.5 ML IM SUSY
0.5000 mL | PREFILLED_SYRINGE | INTRAMUSCULAR | Status: AC
  Administered 2016-09-21: 0.5 mL via INTRAMUSCULAR
  Filled 2016-09-20 (×2): qty 0.5

## 2016-09-20 MED ORDER — VITAMIN B-1 100 MG PO TABS
100.0000 mg | ORAL_TABLET | Freq: Every day | ORAL | Status: DC
  Filled 2016-09-20: qty 1

## 2016-09-20 MED ORDER — VITAMIN B-1 100 MG PO TABS
100.0000 mg | ORAL_TABLET | Freq: Every day | ORAL | Status: DC
  Administered 2016-09-21 – 2016-09-27 (×7): 100 mg via ORAL
  Filled 2016-09-20 (×8): qty 1

## 2016-09-20 MED ORDER — FOLIC ACID 1 MG PO TABS
1.0000 mg | ORAL_TABLET | Freq: Every day | ORAL | Status: DC
  Administered 2016-09-21 – 2016-09-27 (×7): 1 mg via ORAL
  Filled 2016-09-20 (×8): qty 1

## 2016-09-20 MED ORDER — LORAZEPAM 1 MG PO TABS
1.0000 mg | ORAL_TABLET | Freq: Three times a day (TID) | ORAL | Status: AC
Start: 2016-09-22 — End: 2016-09-22
  Administered 2016-09-22 (×3): 1 mg via ORAL
  Filled 2016-09-20 (×3): qty 1

## 2016-09-20 MED ORDER — IBUPROFEN 600 MG PO TABS
600.0000 mg | ORAL_TABLET | Freq: Three times a day (TID) | ORAL | Status: DC | PRN
  Administered 2016-09-20 – 2016-09-26 (×12): 600 mg via ORAL
  Filled 2016-09-20 (×11): qty 1

## 2016-09-20 MED ORDER — IBUPROFEN 600 MG PO TABS: 600.0000 mg | ORAL_TABLET | Freq: Three times a day (TID) | ORAL | Status: DC | PRN

## 2016-09-20 MED ORDER — CITALOPRAM HYDROBROMIDE 20 MG PO TABS
20.0000 mg | ORAL_TABLET | Freq: Every day | ORAL | Status: DC
  Administered 2016-09-21 – 2016-09-24 (×4): 20 mg via ORAL
  Filled 2016-09-20 (×5): qty 1

## 2016-09-20 MED ORDER — LORAZEPAM 1 MG PO TABS: 0.0000 mg | ORAL_TABLET | Freq: Four times a day (QID) | ORAL | Status: DC

## 2016-09-20 MED ORDER — THIAMINE HCL 100 MG/ML IJ SOLN: 100.0000 mg | Freq: Once | INTRAMUSCULAR | Status: DC

## 2016-09-20 MED ORDER — LORAZEPAM 1 MG PO TABS
1.0000 mg | ORAL_TABLET | Freq: Four times a day (QID) | ORAL | Status: AC
  Administered 2016-09-20 – 2016-09-21 (×6): 1 mg via ORAL
  Filled 2016-09-20 (×6): qty 1

## 2016-09-20 MED ORDER — ADULT MULTIVITAMIN W/MINERALS CH
1.0000 | ORAL_TABLET | Freq: Every day | ORAL | Status: DC
  Administered 2016-09-21 – 2016-09-27 (×7): 1 via ORAL
  Filled 2016-09-20 (×8): qty 1

## 2016-09-20 MED ORDER — HYDROXYZINE HCL 25 MG PO TABS
25.0000 mg | ORAL_TABLET | Freq: Four times a day (QID) | ORAL | Status: AC | PRN
  Administered 2016-09-23: 25 mg via ORAL
  Filled 2016-09-20 (×2): qty 1

## 2016-09-20 MED ORDER — LOPERAMIDE HCL 2 MG PO CAPS: 2.0000 mg | ORAL_CAPSULE | ORAL | Status: AC | PRN

## 2016-09-20 MED ORDER — ONDANSETRON 4 MG PO TBDP: 4.0000 mg | ORAL_TABLET | Freq: Four times a day (QID) | ORAL | Status: AC | PRN

## 2016-09-20 MED ORDER — IBUPROFEN 600 MG PO TABS
600.0000 mg | ORAL_TABLET | Freq: Three times a day (TID) | ORAL | Status: DC | PRN
  Filled 2016-09-20: qty 1

## 2016-09-20 NOTE — Progress Notes (Signed)
Patient ID: Lee PlanRobert [name], male   DOB: [birth year], 60 y.o.   MRN: [id number] PER STATE REGULATIONS 482.30  THIS CHART WAS REVIEWED FOR MEDICAL NECESSITY WITH RESPECT TO THE PATIENT'S ADMISSION/DURATION OF STAY.  NEXT REVIEW DATE:09/24/16  Loura HaltBARBARA Zailey Audia, RN, BSN CASE MANAGER

## 2016-09-20 NOTE — Progress Notes (Signed)
Pt attended AA meeting this evening.  

## 2016-09-20 NOTE — [facility] Group Notes (Signed)
[facility] LCSW Group Therapy  09/20/2016 2:27 PM  Type of Therapy:  Group Therapy  Participation Level:  Invited, chose not to attend  Summary of Progress/Problems: Feelings around Relapse. Group members discussed the meaning of relapse and shared personal stories of relapse, how it affected them and others, and how they perceived themselves during this time. Group members were encouraged to identify triggers, warning signs and coping skills used when facing the possibility of relapse. Social supports were discussed and explored in detail. Post Acute Withdrawal Syndrome (handout provided) was introduced and examined. Pt's were encouraged to ask questions, talk about key points associated with PAWS, and process this information in terms of relapse prevention.    Lee [name] Lee [name] 09/20/2016, 2:27 PM   

## 2016-09-20 NOTE — ED Notes (Signed)
[facility]. All belongings returned to pt who signed for same.
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
## 2016-09-20 NOTE — BH Assessment (Addendum)
Dr. Carson MyrtleAkitayo and [name], DNP continue to recommend INPT treatment. Patient has [facility]af [facility]). TTS is unable to send a referral to [facility] because it's not in the catchment area. [facility] is the facility located in the catchment area and patient must be referred to this facility. [facility]. However, [facility] contacted this writer yesterday and declined patient. [facility] recommended [facility]. Writer referred patient to [facility] yesterday. [facility] does not have a bed at this time and patient's referral continues to remain under review. Writer discussed case with Dr. Jannifer FranklinAkintayo an [name], DNP and they feel patient is appropriate [facility]. Writer informed AC, Lillia AbedLindsay that patient is appropriate [facility]. Lillia AbedLindsay [facility]. Patient [facility]. Patient understands that [facility] will [facility] prior to 2pm.
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
## 2016-09-20 NOTE — Progress Notes (Signed)
Patient ID: Lee PlanRobert [name], male   DOB: 05/06/1956, 60 y.o.   MRN: [id number] D: Client visible on the unit, interacted with the help of interpreter. Client interested in medication administration schedule. A: Writer reviewed medications, discussed administration schedule. Medications administered as ordered. Client assessed for symptoms of WD. Staff will monitor q3115min for safety. R:  Client is safe on the unit.

## 2016-09-20 NOTE — Progress Notes (Signed)
Interpreter present.  Reviewed treatment agreement and questions for admission.  Interpreter stated that he would be able to write for communication if interpreter is not present.  He will need interpreter during groups and medication times.  Information reported to nurse.  We will continue to monitor the progress towards his goals.

## 2016-09-21 DIAGNOSIS — F4323 Adjustment disorder with mixed anxiety and depressed mood: Secondary | ICD-10-CM

## 2016-09-21 DIAGNOSIS — F10230 Alcohol dependence with withdrawal, uncomplicated: Secondary | ICD-10-CM

## 2016-09-21 MED ORDER — GABAPENTIN 600 MG PO TABS
300.0000 mg | ORAL_TABLET | Freq: Two times a day (BID) | ORAL | Status: DC
  Administered 2016-09-21 (×2): 300 mg via ORAL
  Filled 2016-09-21 (×3): qty 0.5
  Filled 2016-09-21: qty 1
  Filled 2016-09-21 (×2): qty 0.5

## 2016-09-21 MED ORDER — GABAPENTIN 300 MG PO CAPS
ORAL_CAPSULE | ORAL | Status: AC
  Administered 2016-09-21: 14:00:00
  Filled 2016-09-21: qty 1

## 2016-09-21 NOTE — Plan of Care (Signed)
Problem: Safety: Goal: Periods of time without injury will increase Outcome: Progressing Periods of time without injury will increase with q5515min safety checks, implementation of fall/safety risk plan. Client remains safe on the unit.

## 2016-09-21 NOTE — [facility] Suicide Risk Assessment (Signed)
[facility]   Nursing information obtained from:  Patient Demographic factors:  Male, Caucasian, Unemployed Current Mental Status:  Suicidal ideation indicated by patient Loss Factors:  Loss of significant relationship, Financial problems / change in socioeconomic status Historical Factors:  Prior suicide attempts, Family history of mental illness or substance abuse Risk Reduction Factors:  Living with another person, especially a relative  Total Time spent with patient: 1.5 hours Principal Problem: <principal problem not specified> Diagnosis:   Patient Active Problem List   Diagnosis Date Noted  . MDD (major depressive disorder), recurrent severe, without psychosis (HCC) [F33.2] 09/20/2016  . Suicidal ideation [R45.851]   . Pain in joint, ankle and foot [M25.579]   . Adjustment disorder with mixed anxiety and depressed mood [F43.23]   . Alcohol use disorder, severe, dependence (HCC) [F10.20]   . Alcohol dependence (HCC) [F10.20] 11/15/2015  . Severe recurrent major depression without psychotic features (HCC) [F33.2] 11/15/2015   Subjective Data: Alert oriented, Patient Deaf . Interview and assessment was done with the help of interpretator  Continued Clinical Symptoms:  Alcohol Use Disorder Identification Test Final Score (AUDIT): 25 The "Alcohol Use Disorders Identification Test", Guidelines for Use in Primary Care, Second Edition.  World Science writerHealth Organization [facility](WHO). Score between 0-7:  no or low risk or alcohol related problems. Score between 8-15:  moderate risk of alcohol related problems. Score between 16-19:  high risk of alcohol related problems. Score 20 or above:  warrants further diagnostic evaluation for alcohol dependence and treatment.   CLINICAL FACTORS:   Depression:   Anhedonia Hopelessness Impulsivity Alcohol/Substance Abuse/Dependencies More than one psychiatric diagnosis Unstable or Poor Therapeutic Relationship Previous Psychiatric  Diagnoses and Treatments   Musculoskeletal: Strength & Muscle Tone: within normal limits Gait & Station: normal Patient leans: no lean  Psychiatric Specialty Exam: Physical Exam  Constitutional: He appears well-developed.  Neurological: He is alert.    ROS  Blood pressure (!) 152/86, pulse 72, temperature 97.8 F (36.6 C), resp. rate 18, height 5\' 9"  (1.753 m), weight 72.6 kg (160 lb), SpO2 98 %.Body mass index is 23.63 kg/m.  General Appearance: Disheveled  Eye Contact:  Fair  Speech:  sign language  Volume:  slow  Mood:  Depressed  Affect:  Constricted  Thought Process:  Goal Directed  Orientation:  Full (Time, Place, and Person)  Thought Content:  Rumination  Suicidal Thoughts:  No  Homicidal Thoughts:  No  Memory:  Immediate;   Fair Recent;   Fair  Judgement:  Poor  Insight:  Shallow  Psychomotor Activity:  Normal  Concentration:  Concentration: Fair and Attention Span: Fair  Recall:  [employer]  Fund of Knowledge:  Fair  Language:  Fair  Akathisia:  Negative  Handed:  Right  AIMS (if indicated):     Assets:  Desire for Improvement  ADL's:  Intact  Cognition:  WNL  Sleep:  Number of Hours: 6.75      COGNITIVE FEATURES THAT CONTRIBUTE TO RISK:  Closed-mindedness    SUICIDE RISK:   Moderate:  Frequent suicidal ideation with limited intensity, and duration, some specificity in terms of plans, no associated intent, good self-control, limited dysphoria/symptomatology, some risk factors present, and identifiable protective factors, including available and accessible social support.   PLAN OF CARE: Admit for stabilization, medication management. Safety and detox off from alcohol  I certify that inpatient services furnished can reasonably be expected to improve the patient's condition.  Thresa RossAKHTAR, Asiyah Pineau, MD 09/21/2016, 10:24 AM

## 2016-09-21 NOTE — Progress Notes (Signed)
[facility] Group Notes:  (Nursing/MHT/Case Management/Adjunct)  Date:  09/21/2016  Time:  10:12 PM  Type of Therapy:  wrap up group  Participation Level:  Active  Participation Quality:  Appropriate, Attentive, Sharing and Supportive  Affect:  Appropriate and Depressed  Cognitive:  Appropriate  Insight:  Improving  Engagement in Group:  Engaged  Modes of Intervention:  Clarification, Education and Support  Summary of Progress/Problems: Pt shared that he was happy [facility] have a deaf unit there. Pt shares that he is very depressed and still struggles with loss of his children and relapsed 3 months ago. Pt shares that holidays are extra hard for him.   Marcille BuffyMcNeil, Charley Miske S 09/21/2016, 10:12 PM

## 2016-09-21 NOTE — [facility] Group Notes (Signed)
Nursing Psycho-educational Group:   Patient attended and actively participated in nursing group. Cooperative and engaged. 
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
## 2016-09-21 NOTE — Plan of Care (Signed)
Problem: Activity: Goal: Interest or engagement in activities will improve Outcome: Progressing Pt has attended evening groups this weekend with interpreter

## 2016-09-21 NOTE — Progress Notes (Signed)
D.  Pt pleasant on approach, interpreter present from 1945 until 2100.  Pt denied complaints other than requesting a nighttime dose of neurontin for his rib pain.  Pt had received one dose of two ordered today so NP allowed second dose at HS tonight.  Pt did attend evening wrap up group with interpreter.  Pt denies SI/HI/hallucinations at this time.  A.  Support and encouragement offered, medication given as ordered.  R.  Pt remains safe on the unit, will continue to monitor.
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
## 2016-09-21 NOTE — [facility] Group Notes (Signed)
[facility] LCSW Group Therapy  09/21/2016 10 to 11:00 AM  Type of Therapy:  Group Therapy  Participation Level:  Active  Participation Quality:  Appropriate  Affect:  Depressed  Cognitive:  Alert and oriented  Insight:  Developing  Engagement in Therapy:  Engaged  Modes of Intervention:  Discussion, Problem-solving, Socialization and Support  Summary of Progress/Problems:  The main focus of today's process group was for the patient to identify ways in which they have in the past sabotaged their own recovery or even sabotaged the holidays for themselves and perhaps others Patient shared stressors of losing both his adult son and adult daughter within 2 months to overdose in 2014. Patient reports this is the reason for his ongoing depression and daily alcohol use and had difficulty stating anything he is grateful for or any reason to stop doing what he is doing.    Carney Bernatherine C Harrill
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
## 2016-09-22 MED ORDER — GABAPENTIN 300 MG PO CAPS
300.0000 mg | ORAL_CAPSULE | Freq: Two times a day (BID) | ORAL | Status: DC
  Administered 2016-09-22 – 2016-09-23 (×3): 300 mg via ORAL
  Filled 2016-09-22 (×5): qty 1

## 2016-09-22 NOTE — [facility] Group Notes (Signed)
  [facility] LCSW Group Therapy Note   09/22/2016  10 to 10:45 AM   Type of Therapy and Topic: Group Therapy: Feelings Around Returning Home & Establishing a Supportive Framework and Activity to Identify signs of Improvement or Decompensation   Participation Level: Active   Description of Group:  Patients first processed thoughts and feelings about up coming discharge. These included fears of upcoming changes, lack of change, new living environments, judgements and expectations from others and overall stigma of MH issues. We then discussed what is a supportive framework? What does it look like feel like and how do I discern it from and unhealthy non-supportive network? Learn how to cope when supports are not helpful and don't support you. Discuss what to do when your family/friends are not supportive.   Therapeutic Goals Addressed in Processing Group:  1. Patient will identify one healthy supportive network that they can use at discharge. 2. Patient will identify one factor of a supportive framework and how to tell it from an unhealthy network. 3. Patient able to identify one coping skill to use when they do not have positive supports from others. 4. Patient will demonstrate ability to communicate their needs through discussion and/or role plays.  Summary of Patient Progress:  Pt engaged easily during group session with the help of his sign language interpreter. As patients processed their anxiety about discharge and described healthy supports patient  Shared he has not supports outside of hospital and will likely return to alcohol when discharged. Patient was given opportunity to process different outcomes yet was reluctant to describe outcomes without alcohol use.  Patient chose a visual to represent decompensation as gossip and improvement as peace.  Carney Bernatherine C Harrill, LCSW

## 2016-09-22 NOTE — Progress Notes (Signed)
Pt attended the evening AA speaker meeting. Pt was engaged and appropriate. Pt had an interpreter available. Lee [name], Lee [name], NT 09/22/16  10:07 PM

## 2016-09-22 NOTE — [facility] Group Notes (Signed)
Nurse Psycho-Educational Group centered on human needs and healthy support systems.  Patient attended the group and engaged actively.  
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
## 2016-09-22 NOTE — Progress Notes (Signed)
D   Pt endorses mild to moderate anxiety and nerve problems    He is pleasant on approach and cooperative   He is compliant with treatment and is appropriate in his interactions with others although he has a hearing disability  A    Verbal support given   Medications administered and effectiveness monitored    Q 15 min checks R    Pt is safe at present time and receptive to verbal support 

## 2016-09-23 DIAGNOSIS — F1020 Alcohol dependence, uncomplicated: Secondary | ICD-10-CM

## 2016-09-23 MED ORDER — GABAPENTIN 400 MG PO CAPS
400.0000 mg | ORAL_CAPSULE | Freq: Two times a day (BID) | ORAL | Status: DC
  Administered 2016-09-23 – 2016-09-24 (×3): 400 mg via ORAL
  Filled 2016-09-23 (×7): qty 1

## 2016-09-23 MED ORDER — CLONIDINE HCL 0.1 MG PO TABS
0.1000 mg | ORAL_TABLET | Freq: Every day | ORAL | Status: DC
  Administered 2016-09-23 – 2016-09-27 (×5): 0.1 mg via ORAL
  Filled 2016-09-23 (×8): qty 1

## 2016-09-23 MED ORDER — CLONIDINE HCL 0.2 MG PO TABS
0.2000 mg | ORAL_TABLET | Freq: Once | ORAL | Status: AC
  Administered 2016-09-23: 0.2 mg via ORAL
  Filled 2016-09-23: qty 2
  Filled 2016-09-23: qty 1

## 2016-09-23 MED ORDER — ENSURE ENLIVE PO LIQD
237.0000 mL | Freq: Every day | ORAL | Status: DC | PRN
  Administered 2016-09-23 – 2016-09-26 (×4): 237 mL via ORAL
  Filled 2016-09-23 (×4): qty 237
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
## 2016-09-23 NOTE — Progress Notes (Signed)
[facility]  09/23/2016 11:33 AM Melene PlanRobert [name]  MRN:  [id number] Subjective:   Patient writes he is still experiencing  Depression and sadness.  - patient has concerns with bilateral hand numbness when he woke-up this morning.  Objective: Melene Planobert C Crespo is awake, alert and oriented and pleasant throughout this assessment. Seen pacing the halls.  Denies suicidal or homicidal ideation. Denies auditory or visual hallucination and does not appear to be responding to internal stimuli. Patient reports he is medication compliant without mediation side effects.  Patient reports hx of neck surgery, which he feels the numbness is coming from.  Support, encouragement and reassurance was provided.   Principal Problem: MDD (major depressive disorder), recurrent severe, without psychosis (HCC) Diagnosis:   Patient Active Problem List   Diagnosis Date Noted  . MDD (major depressive disorder), recurrent severe, without psychosis (HCC) [F33.2] 09/20/2016  . Suicidal ideation [R45.851]   . Pain in joint, ankle and foot [M25.579]   . Adjustment disorder with mixed anxiety and depressed mood [F43.23]   . Alcohol use disorder, severe, dependence (HCC) [F10.20]   . Alcohol dependence with uncomplicated withdrawal (HCC) [F10.230] 11/15/2015  . Severe recurrent major depression without psychotic features (HCC) [F33.2] 11/15/2015   Total Time spent with patient: 20 minutes    Past Medical History:  Past Medical History:  Diagnosis Date  . Anxiety   . Deaf   . Depression   . Hep C w/o coma, chronic (HCC)     Past Surgical History:  Procedure Laterality Date  . NECK SURGERY     Family History: History reviewed. No pertinent family history.  Social History:  History  Alcohol Use  . Yes    Comment: When he gets money, been drinking for about 9 days straight. 4-24oz beers in a day. Last drink: 20:00     History  Drug Use No    Social History   Social History  . Marital status:  Divorced    Spouse name: N/A  . Number of children: N/A  . Years of education: N/A   Social History Main Topics  . Smoking status: Current Every Day Smoker    Packs/day: 1.00    Types: Cigarettes  . Smokeless tobacco: Never Used  . Alcohol use Yes     Comment: When he gets money, been drinking for about 9 days straight. 4-24oz beers in a day. Last drink: 20:00  . Drug use: No  . Sexual activity: Yes   Other Topics Concern  . None   Social History Narrative  . None   Additional Social History:    Pain Medications: Given Ibuprofen at ED Prescriptions: see PTA meds Over the Counter: denies History of alcohol / drug use?: Yes Longest period of sobriety (when/how long): pt reports being in rehab in 1999 Negative Consequences of Use: Legal, Personal relationships, Financial Withdrawal Symptoms: Agitation, Tremors, Diarrhea, Sweats Name of Substance 1: Alcohol  1 - Age of First Use: teens 1 - Amount (size/oz): 4 or more 24 ounce beers  1 - Frequency: daily; "I like to drink  in the mornings" 1 - Duration: daily since 2014 1 - Last Use / Amount: 09/18/2016; #4 24 ounce beers                   Sleep: Fair  Appetite:  Fair  Current Medications: Current Facility-Administered Medications  Medication Dose Route Frequency Provider Last Rate Last Dose  . alum & mag hydroxide-simeth (MAALOX/MYLANTA) 200-200-20 MG/5ML  suspension 30 mL  30 mL Oral PRN Charm RingsJamison Y Lord, NP      . citalopram (CELEXA) tablet 20 mg  20 mg Oral Daily Charm RingsJamison Y Lord, NP   20 mg at 09/23/16 [id number]  . feeding supplement (ENSURE ENLIVE) (ENSURE ENLIVE) liquid 237 mL  237 mL Oral Daily PRN Oneta Rackanika N Lewis, NP   237 mL at 09/23/16 1125  . folic acid (FOLVITE) tablet 1 mg  1 mg Oral Daily Charm RingsJamison Y Lord, NP   1 mg at 09/23/16 [id number]  . gabapentin (NEURONTIN) capsule 400 mg  400 mg Oral BID Oneta Rackanika N Lewis, NP      . hydrOXYzine (ATARAX/VISTARIL) tablet 25 mg  25 mg Oral Q6H PRN Sanjuana KavaAgnes I Nwoko, NP      . ibuprofen  (ADVIL,MOTRIN) tablet 600 mg  600 mg Oral Q8H PRN Beau FannyJohn C Withrow, FNP   600 mg at 09/23/16 0845  . loperamide (IMODIUM) capsule 2-4 mg  2-4 mg Oral PRN Sanjuana KavaAgnes I Nwoko, NP      . LORazepam (ATIVAN) tablet 1 mg  1 mg Oral Q6H PRN Sanjuana KavaAgnes I Nwoko, NP   1 mg at 09/22/16 2002  . LORazepam (ATIVAN) tablet 1 mg  1 mg Oral BID Sanjuana KavaAgnes I Nwoko, NP   1 mg at 09/23/16 [id number]   Followed by  . [START ON 09/24/2016] LORazepam (ATIVAN) tablet 1 mg  1 mg Oral Daily Sanjuana KavaAgnes I Nwoko, NP      . magnesium hydroxide (MILK OF MAGNESIA) suspension 30 mL  30 mL Oral Daily PRN Charm RingsJamison Y Lord, NP      . multivitamin with minerals tablet 1 tablet  1 tablet Oral Daily Sanjuana KavaAgnes I Nwoko, NP   1 tablet at 09/23/16 [id number]  . nicotine (NICODERM CQ - dosed in mg/24 hours) patch 21 mg  21 mg Transdermal Daily Craige CottaFernando A Decklyn Hornik, MD   21 mg at 09/23/16 0729  . ondansetron (ZOFRAN-ODT) disintegrating tablet 4 mg  4 mg Oral Q6H PRN Sanjuana KavaAgnes I Nwoko, NP      . thiamine (VITAMIN B-1) tablet 100 mg  100 mg Oral Daily Sanjuana KavaAgnes I Nwoko, NP   100 mg at 09/23/16 [id number]  . traZODone (DESYREL) tablet 100 mg  100 mg Oral QHS PRN Charm RingsJamison Y Lord, NP   100 mg at 09/22/16 2205    Lab Results: No results found for this or any previous visit (from the past 48 hour(s)).  Blood Alcohol level:  Lab Results  Component Value Date   ETH 247 (H) 09/18/2016   ETH 252 (H) 09/12/2016    Metabolic Disorder Labs: No results found for: HGBA1C, MPG No results found for: PROLACTIN No results found for: CHOL, TRIG, HDL, CHOLHDL, VLDL, LDLCALC  Physical Findings: AIMS: Facial and Oral Movements Muscles of Facial Expression: None, normal Lips and Perioral Area: None, normal Jaw: None, normal Tongue: None, normal,Extremity Movements Upper (arms, wrists, hands, fingers): None, normal Lower (legs, knees, ankles, toes): None, normal, Trunk Movements Neck, shoulders, hips: None, normal, Overall Severity Severity of abnormal movements (highest score from questions above): None,  normal Incapacitation due to abnormal movements: None, normal Patient's awareness of abnormal movements (rate only patient's report): No Awareness, Dental Status Current problems with teeth and/or dentures?: No Does patient usually wear dentures?: No  CIWA:  CIWA-Ar Total: 6 COWS:     Musculoskeletal: Strength & Muscle Tone: within normal limits Gait & Station: normal Patient leans: no lean  Psychiatric Specialty Exam: Physical Exam  Vitals reviewed. Constitutional: He  appears well-developed and well-nourished.  HENT:  Head: Normocephalic and atraumatic.  Neurological: He is alert.  Psychiatric: He has a normal mood and affect. His behavior is normal.    Review of Systems  Psychiatric/Behavioral: Positive for depression. The patient is nervous/anxious.     Blood pressure (!) 136/104, pulse 81, temperature 97.8 F (36.6 C), temperature source Oral, resp. rate 18, height 5\' 9"  (1.753 m), weight 72.6 kg (160 lb), SpO2 98 %.Body mass index is 23.63 kg/m.  General Appearance: Casual  Eye Contact:  Fair  Speech:  no speech.   Volume:  no speech. used sign language  Mood:  Depressed  Affect:  Congruent  Thought Process:  Goal Directed  Orientation:  Full (Time, Place, and Person)  Thought Content:  Logical  Suicidal Thoughts:  No  Homicidal Thoughts:  No  Memory:  Immediate;   Fair Recent;   Fair  Judgement:  Fair  Insight:  Shallow  Psychomotor Activity:  Normal  Concentration:  Concentration: Fair and Attention Span: Fair  Recall:  [employer] of Knowledge:  Fair  Language:  poor .  Akathisia:  Negative  Handed:  Right  AIMS (if indicated):     Assets:  Desire for Improvement  ADL's:  Intact  Cognition:  WNL  Sleep:  Number of Hours: 6.75     Treatment Plan Summary: Daily contact with patient to assess and evaluate symptoms and progress in treatment, Medication management and Plan as follows  EKG orders placed, Increased gabapentin 300 mg to 400 mg BID for neuro  pain. Start Clonidine 0.1 mg PO Q D for HTN Major depression severe: continue Celexa 20mg s Alcohol use disorder; continue ativan for detox. Monitor vitals . Stable Attend support groups. Has interpretator for sign language [employer] improving.    Oneta Rack, NP 09/23/2016, 11:33 AM   Agree with NP Progress Note as above

## 2016-09-23 NOTE — Progress Notes (Signed)
D   Pt endorses mild to moderate anxiety and nerve problems    He is pleasant on approach and cooperative   He is compliant with treatment and is appropriate in his interactions with others although he has a hearing disability  A    Verbal support given   Medications administered and effectiveness monitored    Q 15 min checks R    Pt is safe at present time and receptive to verbal support

## 2016-09-24 MED ORDER — CITALOPRAM HYDROBROMIDE 40 MG PO TABS
40.0000 mg | ORAL_TABLET | Freq: Every day | ORAL | Status: DC
  Administered 2016-09-25 – 2016-09-27 (×3): 40 mg via ORAL
  Filled 2016-09-24 (×4): qty 1

## 2016-09-24 MED ORDER — GABAPENTIN 400 MG PO CAPS
400.0000 mg | ORAL_CAPSULE | Freq: Three times a day (TID) | ORAL | Status: DC
  Administered 2016-09-24 – 2016-09-25 (×4): 400 mg via ORAL
  Filled 2016-09-24 (×9): qty 1
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
## 2016-09-24 NOTE — Progress Notes (Signed)
Patient has deaf interpreter this afternoon.  Patient stated he does have thoughts to cut himself, contracts for safety.  [facility]  Is afraid he actually will hurt himself at home, feels very, very depressed because of his 2 children's death by overdose.  Denied HI.  Sometimes he does see images of hurting himself.  Denied hearing voices.  Safety maintained with 15 minute checks.  Emotional support and encouragement given patient.

## 2016-09-24 NOTE — [facility] Group Notes (Signed)
[facility] LCSW Group Therapy  09/24/2016 3:02 PM  Type of Therapy:  Group Therapy  Participation Level:  Did Not Attend -pt had meeting with his [facility] counselor during group time and was excused.   Summary of Progress/Problems: Today's Topic: Overcoming Obstacles. Patients identified one short term goal and potential obstacles in reaching this goal. Patients processed barriers involved in overcoming these obstacles. Patients identified steps necessary for overcoming these obstacles and explored motivation (internal and external) for facing these difficulties head on.   Ahsan Esterline N Smart LCSW 09/24/2016, 3:02 PM

## 2016-09-24 NOTE — Progress Notes (Signed)
CSW [facility](Steven Hunter) in attempt to discuss pt disposition ([email or web address]) and spoke with another provider that pt works with at [facility] named Scott, who is coming to visit with pt today 09/24/2016 after 1:00PM. CSW notified reception about pt's visitor.   Trula SladeHeather Smart, MSW, LCSW Clinical Social Worker 09/24/2016 12:01 PM

## 2016-09-24 NOTE — Progress Notes (Signed)
Recreation Therapy Notes   Animal-Assisted Activity (AAA) Program Checklist/Progress Notes Patient Eligibility Criteria Checklist & Daily Group note for Rec TxIntervention  Date: 12.26.2017 Time: 2:45pm [address]    AAA/T Program Assumption of Risk Form signed by Patient/ or Parent Legal Guardian Yes  Patient is free of allergies or sever asthma Yes  Patient reports no fear of animals Yes  Patient reports no history of cruelty to animals Yes  Patient understands his/her participation is voluntary Yes  Patient washes hands before animal contact Yes  Patient washes hands after animal contact Yes  Behavioral Response: Appropriate   Education:Hand Washing, Appropriate Animal Interaction   Education Outcome: Acknowledges education.   Clinical Observations/Feedback: Patient participated in session with assistance of interpreter. Patient attended session and interacted appropriately with therapy dog and peers. Patient asked appropriate questions about therapy dog and his training. Patient shared stories about their pets at home with group.   Marykay Lexenise L Tanna Loeffler, LRT/CTRS  Seneca Gadbois L 09/24/2016 3:04 PM

## 2016-09-24 NOTE — Progress Notes (Signed)
Patient's self inventory sheet, patient sleeps good, sleep medication is helpful.  Good appetite, normal energy level, good concentration.  Rated depression and hopeless 8, anxiety 9.  Withdrawals, cramping.  SI sometimes.  Physical problems, pain, ribs, neck.  Pain medication is helpful.  Goal is AA.  Nurse good.  Staff yes they help us.  No discharge plans.

## 2016-09-24 NOTE — Progress Notes (Signed)
D   Pt endorses mild to moderate anxiety and nerve problems    He is pleasant on approach and cooperative   He is compliant with treatment and is appropriate in his interactions with others although he has a hearing disability  A    Verbal support given   Medications administered and effectiveness monitored    Q 15 min checks R    Pt is safe at present time and receptive to verbal support 
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
## 2016-09-24 NOTE — Progress Notes (Signed)
Patient did attend the evening speaker AA meeting.  

## 2016-09-24 NOTE — Progress Notes (Signed)
CSW met with pt and his counselor through [facility] to discuss disposition. Both pt and his counselor want him to go to [facility] although out of catchment area. CSW explained process of how LME grants authorization number and has the power to decide if patient is able to go outside of catchment. CSW contacted [facility] to advocate for patient and was granted 7 day auth number from 09/24/16 to 09/30/16. Auth Number: [id number]. CSW then contacted [facility] admissions to explain the situation and was told to fax clinicals to: [phone] including authorization number. Referral faxed 09/24/2016 3:01 PM.   Lee [name], MSW, LCSW Clinical Social Worker 09/24/2016 3:01 PM

## 2016-09-25 DIAGNOSIS — F332 Major depressive disorder, recurrent severe without psychotic features: Principal | ICD-10-CM

## 2016-09-25 DIAGNOSIS — F17210 Nicotine dependence, cigarettes, uncomplicated: Secondary | ICD-10-CM

## 2016-09-25 DIAGNOSIS — Z79899 Other long term (current) drug therapy: Secondary | ICD-10-CM

## 2016-09-25 DIAGNOSIS — R45851 Suicidal ideations: Secondary | ICD-10-CM

## 2016-09-25 MED ORDER — GABAPENTIN 400 MG PO CAPS: 400.0000 mg | ORAL_CAPSULE | Freq: Three times a day (TID) | ORAL | Status: DC

## 2016-09-25 MED ORDER — GABAPENTIN 300 MG PO CAPS
600.0000 mg | ORAL_CAPSULE | Freq: Three times a day (TID) | ORAL | Status: DC
  Administered 2016-09-25 – 2016-09-27 (×5): 600 mg via ORAL
  Filled 2016-09-25 (×8): qty 2

## 2016-09-25 MED ORDER — METHOCARBAMOL 750 MG PO TABS
750.0000 mg | ORAL_TABLET | Freq: Three times a day (TID) | ORAL | Status: DC | PRN
  Administered 2016-09-26 (×2): 750 mg via ORAL
  Filled 2016-09-25 (×2): qty 1

## 2016-09-25 MED ORDER — BUPROPION HCL ER (XL) 150 MG PO TB24
150.0000 mg | ORAL_TABLET | Freq: Every day | ORAL | Status: DC
  Administered 2016-09-25 – 2016-09-27 (×3): 150 mg via ORAL
  Filled 2016-09-25 (×4): qty 1

## 2016-09-25 NOTE — Progress Notes (Signed)
Patient ID: Lee [name], male   DOB: 08/11/1956, 60 y.o.   MRN: [id number] PER STATE REGULATIONS 482.30  THIS CHART WAS REVIEWED FOR MEDICAL NECESSITY WITH RESPECT TO THE PATIENT'S ADMISSION/ DURATION OF STAY.  NEXT REVIEW DATE: 09/28/2016  Lee [name] Lee Mink, RN, BSN CASE MANAGER

## 2016-09-25 NOTE — Progress Notes (Signed)
Patient attended AA group meeting.  

## 2016-09-25 NOTE — Progress Notes (Addendum)
Patient ID: Lee PlanRobert [name], male   DOB: [birth year], 60 y.o.   MRN: [id number]  DAR: Pt. Denies HI and A/V Hallucinations. He continues to report passive SI but is able to contract for safety. He reports sleep is good, appetite is good, energy level is normal, and concentration is good. He rates depression 8/10 and anxiety 5/10. Patient now rates pain 3/10. He is independent with ADL's and is ambulating without difficulty or equipment. His affect is animated and sad and his mood is depressed and anxious. Patient does report pain in right rib cage area and his neck. PRN medication was administered to patient and provided some relief. Support and encouragement provided to the patient. Patient reports his heart emotionally hurts. He reports that [facility] Patient is cooperative and pleasant during interaction. Patient's interpreter was present this morning and reports that he will be back after patient returns from lunch. Patient is seen in the milieu mostly doing puzzles or word searches. Q15 minute checks are maintained for safety.

## 2016-09-25 NOTE — Progress Notes (Signed)
CSW spoke with Pam in [facility]. They are reviewing pt's referral and should have an answer by Thursday. Pam requested 3 page regional referral form. CSW faxed along with updated progress notes 09/25/2016 11:14 AM   Trula SladeHeather Smart, MSW, LCSW Clinical Social Worker 09/25/2016 11:14 AM

## 2016-09-25 NOTE — [facility] Group Notes (Signed)
[facility] LCSW Group Therapy  09/25/2016 4:23 PM  Type of Therapy:  Group Therapy  Participation Level:  Active  Participation Quality:  Attentive  Affect:  Appropriate  Cognitive:  Appropriate  Insight:  Engaged  Engagement in Therapy:  Engaged  Modes of Intervention:  Confrontation, Discussion, Education, Problem-solving, Socialization and Support  Summary of Progress/Problems: Today's Topic: Overcoming Obstacles. Patients identified one short term goal and potential obstacles in reaching this goal. Patients processed barriers involved in overcoming these obstacles. Patients identified steps necessary for overcoming these obstacles and explored motivation (internal and external) for facing these difficulties head on.  Lee [name] was attentive and engaged during today's processing group. He shared that the death of his two children due to overdoses is the biggest obstacle relating to his life at this point. He shared that his drinking became out of control after these losses and he has not been able to get himself back on track. [facility] and explored what he hopes to gain from further treatment for depression and alcohol abuse.   Ledell PeoplesHeather N Smart LCSW 09/25/2016, 4:23 PM

## 2016-09-25 NOTE — Progress Notes (Signed)
[facility]  09/25/2016 10:32 AM Lee [name]  MRN:  [id number] Subjective:   Patient seen, chart reviewed and case discussed with nursing staff. No significant overnight event.   Interview was done with interpreter sign language  He states that he feels sad and that he does all the time.  He is ruminative about their deaths as they died of overdose on illegal substances.  He reports compliance on Celexa 40 mg before admission and that he has been on it for 1 year but feels it is not effective anymore.  He slept well last night. He denies AH/VH. He continues to report chronic arm numbness radiating from his neck.   Principal Problem: MDD (major depressive disorder), recurrent severe, without psychosis (HCC) Diagnosis:   Patient Active Problem List   Diagnosis Date Noted  . MDD (major depressive disorder), recurrent severe, without psychosis (HCC) [F33.2] 09/20/2016  . Suicidal ideation [R45.851]   . Pain in joint, ankle and foot [M25.579]   . Adjustment disorder with mixed anxiety and depressed mood [F43.23]   . Alcohol use disorder, severe, dependence (HCC) [F10.20]   . Alcohol dependence with uncomplicated withdrawal (HCC) [F10.230] 11/15/2015  . Severe recurrent major depression without psychotic features (HCC) [F33.2] 11/15/2015   Total Time spent with patient: 20 minutes  Past Psychiatric History: see HPI  Past Medical History:  Past Medical History:  Diagnosis Date  . Anxiety   . Deaf   . Depression   . Hep C w/o coma, chronic (HCC)     Past Surgical History:  Procedure Laterality Date  . NECK SURGERY     Family History: History reviewed. No pertinent family history. Family Psychiatric  History: see HPI Social History:  History  Alcohol Use  . Yes    Comment: When he gets money, been drinking for about 9 days straight. 4-24oz beers in a day. Last drink: 20:00     History  Drug Use No    Social History   Social History  . Marital status:  Divorced    Spouse name: N/A  . Number of children: N/A  . Years of education: N/A   Social History Main Topics  . Smoking status: Current Every Day Smoker    Packs/day: 1.00    Types: Cigarettes  . Smokeless tobacco: Never Used  . Alcohol use Yes     Comment: When he gets money, been drinking for about 9 days straight. 4-24oz beers in a day. Last drink: 20:00  . Drug use: No  . Sexual activity: Yes   Other Topics Concern  . None   Social History Narrative  . None   Additional Social History:    Pain Medications: Given Ibuprofen at ED Prescriptions: see PTA meds Over the Counter: denies History of alcohol / drug use?: Yes Longest period of sobriety (when/how long): pt reports being in rehab in 1999 Negative Consequences of Use: Legal, Personal relationships, Financial Withdrawal Symptoms: Agitation, Tremors, Diarrhea, Sweats Name of Substance 1: Alcohol  1 - Age of First Use: teens 1 - Amount (size/oz): 4 or more 24 ounce beers  1 - Frequency: daily; "I like to drink  in the mornings" 1 - Duration: daily since 2014 1 - Last Use / Amount: 09/18/2016; #4 24 ounce beers                   Sleep: Good  Appetite:  Good  Current Medications: Current Facility-Administered Medications  Medication Dose Route Frequency Provider Last  Rate Last Dose  . alum & mag hydroxide-simeth (MAALOX/MYLANTA) 200-200-20 MG/5ML suspension 30 mL  30 mL Oral PRN Charm Rings, NP      . citalopram (CELEXA) tablet 40 mg  40 mg Oral Daily Neysa Hotter, MD   40 mg at 09/25/16 0809  . cloNIDine (CATAPRES) tablet 0.1 mg  0.1 mg Oral Daily Oneta Rack, NP   0.1 mg at 09/25/16 0809  . feeding supplement (ENSURE ENLIVE) (ENSURE ENLIVE) liquid 237 mL  237 mL Oral Daily PRN Oneta Rack, NP   237 mL at 09/25/16 0813  . folic acid (FOLVITE) tablet 1 mg  1 mg Oral Daily Charm Rings, NP   1 mg at 09/25/16 0809  . gabapentin (NEURONTIN) capsule 400 mg  400 mg Oral TID Neysa Hotter, MD   400 mg  at 09/25/16 0809  . ibuprofen (ADVIL,MOTRIN) tablet 600 mg  600 mg Oral Q8H PRN Beau Fanny, FNP   600 mg at 09/25/16 0810  . magnesium hydroxide (MILK OF MAGNESIA) suspension 30 mL  30 mL Oral Daily PRN Charm Rings, NP      . multivitamin with minerals tablet 1 tablet  1 tablet Oral Daily Sanjuana Kava, NP   1 tablet at 09/25/16 0809  . nicotine (NICODERM CQ - dosed in mg/24 hours) patch 21 mg  21 mg Transdermal Daily Craige Cotta, MD   21 mg at 09/25/16 0809  . thiamine (VITAMIN B-1) tablet 100 mg  100 mg Oral Daily Sanjuana Kava, NP   100 mg at 09/25/16 0809  . traZODone (DESYREL) tablet 100 mg  100 mg Oral QHS PRN Charm Rings, NP   100 mg at 09/24/16 2149    Lab Results: No results found for this or any previous visit (from the past 48 hour(s)).  Blood Alcohol level:  Lab Results  Component Value Date   ETH 247 (H) 09/18/2016   ETH 252 (H) 09/12/2016    Metabolic Disorder Labs: No results found for: HGBA1C, MPG No results found for: PROLACTIN No results found for: CHOL, TRIG, HDL, CHOLHDL, VLDL, LDLCALC  Physical Findings: AIMS: Facial and Oral Movements Muscles of Facial Expression: None, normal Lips and Perioral Area: None, normal Jaw: None, normal Tongue: None, normal,Extremity Movements Upper (arms, wrists, hands, fingers): None, normal Lower (legs, knees, ankles, toes): None, normal, Trunk Movements Neck, shoulders, hips: None, normal, Overall Severity Severity of abnormal movements (highest score from questions above): None, normal Incapacitation due to abnormal movements: None, normal Patient's awareness of abnormal movements (rate only patient's report): No Awareness, Dental Status Current problems with teeth and/or dentures?: No Does patient usually wear dentures?: No  CIWA:  CIWA-Ar Total: 4 COWS:     Musculoskeletal: Strength & Muscle Tone: within normal limits Gait & Station: normal Patient leans: N/A  Psychiatric Specialty Exam: Physical  Exam  Nursing note and vitals reviewed. Psychiatric: His speech is normal and behavior is normal. Judgment and thought content normal. His mood appears anxious. Cognition and memory are normal. He exhibits a depressed mood.    Review of Systems  Constitutional: Negative.   HENT: Negative.   Eyes: Negative.   Respiratory: Negative.   Cardiovascular: Negative.   Gastrointestinal: Negative.   Musculoskeletal: Positive for neck pain.  Skin: Negative.   Neurological: Positive for tremors.  Endo/Heme/Allergies: Negative.   Psychiatric/Behavioral: Positive for depression. Negative for hallucinations, substance abuse and suicidal ideas. The patient is not nervous/anxious and does not have insomnia.  All other systems reviewed and are negative.   Blood pressure 117/80, pulse (!) 58, temperature 98.1 F (36.7 C), resp. rate 18, height 5\' 9"  (1.753 m), weight 72.6 kg (160 lb), SpO2 98 %.Body mass index is 23.63 kg/m.  General Appearance: Fairly Groomed  Patent attorneyye Contact:  (looking at the interpreter)  Speech:  uses sign language  Volume:  uses sign language  Mood:  Depressed  Affect:  down  Thought Process:  Coherent and Goal Directed  Orientation:  Full (Time, Place, and Person)  Thought Content:  Logical Perceptions: denies AH/VH  Suicidal Thoughts:  Yes.  without intent/plan  Homicidal Thoughts:  No  Memory:  Immediate;   Good Recent;   Good Remote;   Good  Judgement:  Fair  Insight:  Fair  Psychomotor Activity:  Normal  Concentration:  Concentration: Good and Attention Span: Good  Recall:  Good  Fund of Knowledge:  Good  Language:  Good  Akathisia:  No  Handed:  Right  AIMS (if indicated):     Assets:  Communication Skills Desire for Improvement  ADL's:  Intact  Cognition:  WNL  Sleep:  Number of Hours: 6.5   Assessment Lee [name] is a 60 year old male with alcohol use, depression [facility] in the setting of alcohol use and loss of his son  and daughter in 2014.  # MDD # Bereavement # r/o substance induced mood disorder Patient reports neurovegetative symptoms including passive SI. Will increase citalopram to optimize its effect.   # Alcohol use disorder # Alcohol withdrawal Patient has mild tremors. Patient will complete ativan protocol. Will monitor with CIWA. Patient is motivated for sobriety and is [facility]; will liaise with SW. Will increase gabapentin which would be beneficial for alcohol abstinence.   Treatment Plan: -Review of chart, vital signs, medications, and notes.  - Increase Gabapentin 600 mg TID - cont Citalopram 40 mg daily (QTC 453 msec on 09/01/2016) -Add Wellbutrin XL 150 mg daily MDD -Add Robaxin 750 mg Q8hrs PRN MSK pain - Medication management to reduce current symptoms to base line and improve the patient's overall level of functioning. - Monitor for the adverse effect of the medications and anger outbursts - Continue 15 minutes observation for safety concerns - Encouraged to participate in milieu therapy and group therapy counseling sessions and also work with coping skills -  Develop treatment plan to decrease risk of relapse upon discharge and to reduce the need for readmission. -  Psycho-social education regarding relapse prevention and self care. - Health care follow up as needed for medical problems. - Restart home medications where appropriate.  Lindwood QuaSheila May Anaja Monts, NP [facility] 09/25/2016, 10:32 AM

## 2016-09-25 NOTE — Progress Notes (Signed)
Recreation Therapy Notes  Date: 09/25/16 Time: 0930 Location: 300 Hall Dayroom  Group Topic: Stress Management  Goal Area(s) Addresses:  Patient will verbalize importance of using healthy stress management.  Patient will identify positive emotions associated with healthy stress management.   Behavioral Response: Engaged  Intervention: Calm App  Activity :  Resilience Meditation.  LRT introduced the stress management technique of meditation.  LRT played the meditation from the calm app to engage patients in the activity.  Patients were to follow along as the meditation played to engage in the process.  Education:  Stress Management, Discharge Planning.   Education Outcome: Acknowledges edcuation/In group clarification offered/Needs additional education  Clinical Observations/Feedback: Pt attended group.   [name], LRT/CTRS         [name] 09/25/2016 12:22 PM 

## 2016-09-26 NOTE — Plan of Care (Signed)
Problem: Activity: Goal: Sleeping patterns will improve Outcome: Progressing Patient has been sleeping during shift without issue. Patient's respirations are even and unlabored.
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
## 2016-09-26 NOTE — [facility] Suicide Risk Assessment (Signed)
[facility] INPATIENT:  Family/Significant Other Suicide Prevention Education  Suicide Prevention Education:  Patient Refusal for Family/Significant Other Suicide Prevention Education: The patient Lee [name] has refused to provide written consent for family/significant other to be provided Family/Significant Other Suicide Prevention Education during admission and/or prior to discharge.  Physician notified.  SPE completed with pt, as pt refused to consent to family contact. SPI pamphlet provided to pt and pt was encouraged to share information with support network, ask questions, and talk about any concerns relating to SPE. Pt denies access to guns/firearms and verbalized understanding of information provided. Mobile Crisis information also provided to pt.   Alexei Ey N Smart LCSW 09/26/2016, 3:56 PM

## 2016-09-26 NOTE — [facility] Suicide Risk Assessment (Signed)
[facility]   Principal Problem: MDD (major depressive disorder), recurrent severe, without psychosis (HCC) Discharge Diagnoses:  Patient Active Problem List   Diagnosis Date Noted  . MDD (major depressive disorder), recurrent severe, without psychosis (HCC) [F33.2] 09/20/2016  . Suicidal ideation [R45.851]   . Pain in joint, ankle and foot [M25.579]   . Adjustment disorder with mixed anxiety and depressed mood [F43.23]   . Alcohol use disorder, severe, dependence (HCC) [F10.20]   . Alcohol dependence with uncomplicated withdrawal (HCC) [F10.230] 11/15/2015  . Severe recurrent major depression without psychotic features (HCC) [F33.2] 11/15/2015    Total Time spent with patient: 30 minutes  Musculoskeletal: Strength & Muscle Tone: within normal limits Gait & Station: normal Patient leans: N/A  Psychiatric Specialty Exam: Review of Systems  Musculoskeletal: Positive for neck pain.  Psychiatric/Behavioral: Positive for depression and suicidal ideas. The patient is nervous/anxious.   All other systems reviewed and are negative.   Blood pressure (!) 159/83, pulse 68, temperature 98.1 F (36.7 C), temperature source Oral, resp. rate 20, height 5\' 9"  (1.753 m), weight 72.6 kg (160 lb), SpO2 98 %.Body mass index is 23.63 kg/m.  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  without intent/plan  Homicidal Thoughts:  No  Memory:  Immediate;   Fair Recent;   Fair Remote;   Fair  Judgement:  Fair  Insight:  Fair  Psychomotor Activity:  Normal  Concentration:  Fair  Recall:  [employer]Fair  Fund of Knowledge:Fair  Language: Fair  Akathisia:  No  Handed:  Right  AIMS (if indicated):      Assets:  Desire for Improvement Physical Health  Sleep:  Number of Hours: 6.5  Cognition: WNL  ADL's:  Intact   Mental Status Per Nursing Assessment::   On Admission:  Suicidal ideation indicated by patient  Demographic Factors:  Male  Loss Factors: NA  Historical Factors: Impulsivity  Risk Reduction Factors:   Positive therapeutic relationship  Continued Clinical Symptoms:  Previous Psychiatric Diagnoses and Treatments Medical Diagnoses and Treatments/Surgeries  Cognitive Features That Contribute To Risk:  Polarized thinking and Thought constriction (tunnel vision)    Suicide Risk:  Moderate:  Frequent suicidal ideation with limited intensity, and duration, some specificity in terms of plans, no associated intent, good self-control, limited dysphoria/symptomatology, some risk factors present, and identifiable protective factors, including available and accessible social support.  Follow-up Information    [facility] Follow up.   Contact information: [address]  [place], [place] [address] [phone]       [facility] Follow up.   Why:  Referral faxed 09/24/16 at 3:00PM. [facility] Authorization Number: [id number] from 09/24/16-09/30/16.  Contact information: [address] [place], [place] [address] Phone: [phone] Fax: [phone]          Plan Of Care/Follow-up recommendations:  Activity:  NO RESTRICTIONS Diet:  REGULAR Tests:  AS PER THE ACCEPTING FACILITY Other:  NONE  Consandra Laske, MD 09/26/2016, 3:11 PM

## 2016-09-26 NOTE — Progress Notes (Signed)
[facility]  09/26/2016 10:54 AM Lee [name]  MRN:  [id number] Subjective:  "I feel better today overall. Not great but definitely better. I can tell the meds are making a difference."  Interview was done with interpreter sign language  Objective: Pt seen and chart reviewed. Pt is alert/oriented x4, calm, cooperative, and appropriate to situation. Pt denies homicidal ideation and psychosis and does not appear to be responding to internal stimuli. Pt reports that he feels suicidal at times but is able to contract for safety; he reports rumination about his family members who have died and that this is contributing to his depression.    Principal Problem: MDD (major depressive disorder), recurrent severe, without psychosis (HCC) Diagnosis:   Patient Active Problem List   Diagnosis Date Noted  . MDD (major depressive disorder), recurrent severe, without psychosis (HCC) [F33.2] 09/20/2016  . Suicidal ideation [R45.851]   . Pain in joint, ankle and foot [M25.579]   . Adjustment disorder with mixed anxiety and depressed mood [F43.23]   . Alcohol use disorder, severe, dependence (HCC) [F10.20]   . Alcohol dependence with uncomplicated withdrawal (HCC) [F10.230] 11/15/2015  . Severe recurrent major depression without psychotic features (HCC) [F33.2] 11/15/2015   Total Time spent with patient: 15 minutes  Past Psychiatric History: see HPI  Past Medical History:  Past Medical History:  Diagnosis Date  . Anxiety   . Deaf   . Depression   . Hep C w/o coma, chronic (HCC)     Past Surgical History:  Procedure Laterality Date  . NECK SURGERY     Family History: History reviewed. No pertinent family history. Family Psychiatric  History: see HPI Social History:  History  Alcohol Use  . Yes    Comment: When he gets money, been drinking for about 9 days straight. 4-24oz beers in a day. Last drink: 20:00     History  Drug Use No    Social History   Social History  .  Marital status: Divorced    Spouse name: N/A  . Number of children: N/A  . Years of education: N/A   Social History Main Topics  . Smoking status: Current Every Day Smoker    Packs/day: 1.00    Types: Cigarettes  . Smokeless tobacco: Never Used  . Alcohol use Yes     Comment: When he gets money, been drinking for about 9 days straight. 4-24oz beers in a day. Last drink: 20:00  . Drug use: No  . Sexual activity: Yes   Other Topics Concern  . None   Social History Narrative  . None   Additional Social History:    Pain Medications: Given Ibuprofen at ED Prescriptions: see PTA meds Over the Counter: denies History of alcohol / drug use?: Yes Longest period of sobriety (when/how long): pt reports being in rehab in 1999 Negative Consequences of Use: Legal, Personal relationships, Financial Withdrawal Symptoms: Agitation, Tremors, Diarrhea, Sweats Name of Substance 1: Alcohol  1 - Age of First Use: teens 1 - Amount (size/oz): 4 or more 24 ounce beers  1 - Frequency: daily; "I like to drink  in the mornings" 1 - Duration: daily since 2014 1 - Last Use / Amount: 09/18/2016; #4 24 ounce beers                   Sleep: Good  Appetite:  Good  Current Medications: Current Facility-Administered Medications  Medication Dose Route Frequency Provider Last Rate Last Dose  . alum &  mag hydroxide-simeth (MAALOX/MYLANTA) 200-200-20 MG/5ML suspension 30 mL  30 mL Oral PRN Charm Rings, NP      . buPROPion (WELLBUTRIN XL) 24 hr tablet 150 mg  150 mg Oral Daily Adonis Brook, NP   150 mg at 09/26/16 0748  . citalopram (CELEXA) tablet 40 mg  40 mg Oral Daily Neysa Hotter, MD   40 mg at 09/26/16 0748  . cloNIDine (CATAPRES) tablet 0.1 mg  0.1 mg Oral Daily Oneta Rack, NP   0.1 mg at 09/26/16 0748  . feeding supplement (ENSURE ENLIVE) (ENSURE ENLIVE) liquid 237 mL  237 mL Oral Daily PRN Oneta Rack, NP   237 mL at 09/25/16 0813  . folic acid (FOLVITE) tablet 1 mg  1 mg Oral  Daily Charm Rings, NP   1 mg at 09/26/16 0748  . gabapentin (NEURONTIN) capsule 600 mg  600 mg Oral TID Adonis Brook, NP   600 mg at 09/26/16 0748  . ibuprofen (ADVIL,MOTRIN) tablet 600 mg  600 mg Oral Q8H PRN Beau Fanny, FNP   600 mg at 09/26/16 0636  . magnesium hydroxide (MILK OF MAGNESIA) suspension 30 mL  30 mL Oral Daily PRN Charm Rings, NP      . methocarbamol (ROBAXIN) tablet 750 mg  750 mg Oral Q8H PRN Adonis Brook, NP   750 mg at 09/26/16 0636  . multivitamin with minerals tablet 1 tablet  1 tablet Oral Daily Sanjuana Kava, NP   1 tablet at 09/26/16 0748  . nicotine (NICODERM CQ - dosed in mg/24 hours) patch 21 mg  21 mg Transdermal Daily Craige Cotta, MD   21 mg at 09/26/16 0748  . thiamine (VITAMIN B-1) tablet 100 mg  100 mg Oral Daily Sanjuana Kava, NP   100 mg at 09/26/16 0750  . traZODone (DESYREL) tablet 100 mg  100 mg Oral QHS PRN Charm Rings, NP   100 mg at 09/25/16 2121    Lab Results: No results found for this or any previous visit (from the past 48 hour(s)).  Blood Alcohol level:  Lab Results  Component Value Date   ETH 247 (H) 09/18/2016   ETH 252 (H) 09/12/2016    Metabolic Disorder Labs: No results found for: HGBA1C, MPG No results found for: PROLACTIN No results found for: CHOL, TRIG, HDL, CHOLHDL, VLDL, LDLCALC  Physical Findings: AIMS: Facial and Oral Movements Muscles of Facial Expression: None, normal Lips and Perioral Area: None, normal Jaw: None, normal Tongue: None, normal,Extremity Movements Upper (arms, wrists, hands, fingers): None, normal Lower (legs, knees, ankles, toes): None, normal, Trunk Movements Neck, shoulders, hips: None, normal, Overall Severity Severity of abnormal movements (highest score from questions above): None, normal Incapacitation due to abnormal movements: None, normal Patient's awareness of abnormal movements (rate only patient's report): No Awareness, Dental Status Current problems with teeth and/or  dentures?: Yes Does patient usually wear dentures?: No  CIWA:  CIWA-Ar Total: 2 COWS:     Musculoskeletal: Strength & Muscle Tone: within normal limits Gait & Station: normal Patient leans: N/A  Psychiatric Specialty Exam: Physical Exam  Nursing note and vitals reviewed. Neck: Tracheal deviation present.  Psychiatric: His speech is normal and behavior is normal. Judgment and thought content normal. His mood appears anxious. Cognition and memory are normal. He exhibits a depressed mood.    Review of Systems  Constitutional: Negative.   HENT: Negative.   Eyes: Negative.   Respiratory: Negative.   Cardiovascular: Negative.  Gastrointestinal: Negative.   Musculoskeletal: Positive for neck pain.  Skin: Negative.   Neurological: Positive for tremors.  Endo/Heme/Allergies: Negative.   Psychiatric/Behavioral: Positive for depression, substance abuse and suicidal ideas. Negative for hallucinations. The patient is nervous/anxious and has insomnia.   All other systems reviewed and are negative.   Blood pressure (!) 159/83, pulse 68, temperature 98.1 F (36.7 C), temperature source Oral, resp. rate 20, height 5\' 9"  (1.753 m), weight 72.6 kg (160 lb), SpO2 98 %.Body mass index is 23.63 kg/m.  General Appearance: Casual and Fairly Groomed+  Eye Contact:  (looking at the interpreter)  Speech:  uses sign language  Volume:  uses sign language  Mood:  Depressed yet improving  Affect:  Depressed, congruent  Thought Process:  Coherent and Goal Directed  Orientation:  Full (Time, Place, and Person)  Thought Content:  Logical Perceptions: denies AH/VH  Suicidal Thoughts:  Yes.  without intent/plan can contract for safety on the unit  Homicidal Thoughts:  No  Memory:  Immediate;   Good Recent;   Good Remote;   Good  Judgement:  Fair  Insight:  Fair  Psychomotor Activity:  Normal  Concentration:  Concentration: Good and Attention Span: Good  Recall:  Good  Fund of Knowledge:  Good   Language:  Good  Akathisia:  No  Handed:  Right  AIMS (if indicated):     Assets:  Communication Skills Desire for Improvement  ADL's:  Intact  Cognition:  WNL  Sleep:  Number of Hours: 6.5   Assessment Lee [name] is a 60 year old male with alcohol use, depression who [facility] in the setting of alcohol use and loss of his son and daughter in 2014.  # MDD # Bereavement # r/o substance induced mood disorder Today on 09/26/16, Pt reports mild improvement and feels the medications are helping, yet still depressed today.   # Alcohol use disorder # Alcohol withdrawal Today on 09/26/16, pt reports tremors have nearly resolved,  Patient will complete ativan protocol. Will monitor with CIWA. Patient is motivated for sobriety and [facility]; will liaise with SW. Will continue gabapentin which would be beneficial for alcohol abstinence.   Treatment Plan: -Review of chart, vital signs, medications, and notes.  - ContinueGabapentin 600 mg TID -Continue Citalopram 40 mg daily (QTC 453 msec on 09/01/2016) -Continue Wellbutrin XL 150 mg daily MDD -Continue Robaxin 750 mg Q8hrs PRN MSK pain - Medication management to reduce current symptoms to base line and improve the patient's overall level of functioning. - Monitor for the adverse effect of the medications and anger outbursts - Continue 15 minutes observation for safety concerns - Encouraged to participate in milieu therapy and group therapy counseling sessions and also work with coping skills -  Develop treatment plan to decrease risk of relapse upon discharge and to reduce the need for readmission. -  Psycho-social education regarding relapse prevention and self care. - Health care follow up as needed for medical problems. - Restart home medications where appropriate.  Beau FannyWithrow, Caydn Justen C, FNP [facility] 09/26/2016, 10:54 AM

## 2016-09-26 NOTE — [facility] Group Notes (Signed)
[facility] LCSW Group Therapy  09/26/2016 2:07 PM  Type of Therapy:  Group Therapy  Participation Level:  Active  Participation Quality:  Attentive  Affect:  Appropriate  Cognitive:  Alert and Oriented  Insight:  Improving  Engagement in Therapy:  Improving  Modes of Intervention:  Discussion, Education, Exploration, Problem-solving, Rapport Building, Socialization and Support  Summary of Progress/Problems: Emotion Regulation: This group focused on both positive and negative emotion identification and allowed group members to process ways to identify feelings, regulate negative emotions, and find healthy ways to manage internal/external emotions. Group members were asked to reflect on a time when their reaction to an emotion led to a negative outcome and explored how alternative responses using emotion regulation would have benefited them. Group members were also asked to discuss a time when emotion regulation was utilized when a negative emotion was experienced. Lee MaduroRobert was attentive and engaged during today's processing group. He shared that he struggles with grief this time of year especially, due to the death's of his two children to drug overdose. He reports that his remaining family blame him and he feels down and depressed about this. Pt reports that it would do him good to go to treatment for a month and get away from the negativity. He is excited [facility] and plans to discharge in the morning.   Ireta Pullman N Smart LCSW 09/26/2016, 2:07 PM

## 2016-09-26 NOTE — Progress Notes (Signed)
Nursing Progress Note 7p-7a  D) Patient presents with flat affect. Patient answered questions to Clinical research associatewriter via interpreter. Patient denies SI/HI or AVH. Patient reported neck and rib pain 5/10. Patient had no other issues or complaints to writer at this time. Patient contracts for safety at this time.   A) Patient medicated without issues. Patient on q15 min safety checks. Opportunities for questions or concerns presented to patient. Patient provided pen and paper if needed to communicate with staff.  R) Patient appears receptive to interaction with nurse. Patient remains safe on the unit at this time. Patient is resting in bed without complaints. Will continue to monitor.

## 2016-09-26 NOTE — Progress Notes (Signed)
  [facility] Discharge Plan :  Will you be returning to the same living situation after discharge:  No.Pt [facility] Friday 09[facility]27/16 At discharge, do you have transportation home?: Yes,  [employer] will pick him up Friday morning Do you have the ability to pay for your medications: Yes,  Healthteam advantage/Mental Health  Release of information consent forms completed and submitted to medical records by CSW.  Patient to Follow up at: Follow-up Information    [facility] Follow up.   Why:  Social worker [facility]. Please follow-up with them [facility] to arrange follow-up when you return home. Thank you.  Contact information: [address]  [place], [place] [address] [phone]       [facility].   Why:  [facility]. Admitting MD: Dr. Daphine DeutscherMartin. [employer] will transport you to facility on Friday morning.  Contact information: [address] [place], [place] [address] Phone: [phone] Fax: [phone]          Next level of care provider has access to [facility] Link:no  Safety Planning and Suicide Prevention discussed: Yes,  SPE completed with pt; pt declined to consent to family contact. SPI pamphlet and Mobile Crisis information provided.  Have you used any form of tobacco in the last 30 days? (Cigarettes, Smokeless Tobacco, Cigars, and/or Pipes): Yes  Has patient been referred to the Quitline?: Patient refused referral  Patient has been referred for addiction treatment: Yes  Ledell PeoplesHeather N Smart LCSW 09/26/2016, 3:56 PM

## 2016-09-26 NOTE — Progress Notes (Signed)
Patient attended group meeting.  

## 2016-09-26 NOTE — Progress Notes (Signed)
CSW contacted [facility] admissions to check status of referral and was told that referral is still in review with medical staff and will be sent to psychiatrist for review today. CSW was told that a decision should be made by today.  Trula SladeHeather Smart, MSW, LCSW Clinical Social Worker 09/26/2016 10:51 AM

## 2016-09-26 NOTE — Progress Notes (Signed)
D: Patient denies SI/HI and A/V hallucinations; patient reports that he mood is improving; patient reports    A: Monitored q 15 minutes; patient encouraged to attend groups; patient educated about medications; patient given medications per physician orders; patient encouraged to express feelings and/or concerns  R: Patient is cooperative and pleasant; patient has a brighter affect; patient engages with staff and peers; patient attends all groups and patient takes all medications

## 2016-09-26 NOTE — Progress Notes (Signed)
Patient ID: Lee [name], male   DOB: 1956-06-26, 60 y.o.   MRN: [id number] D: client visible on the unit, interaction facilitated by an interpreter. Client reports spasms in neck, depression "8" of 10. Client requesting night medications early. A:Writer provided emotional support, reviewed medication administration schedule noting medications can be given after group, but pain medication administered at that time. Administered Robaxin 750 mg.(see MAR) Staff will monitor q6315min for safety. R:Client is safe on the unit, attended karaoke.

## 2016-09-26 NOTE — Progress Notes (Signed)
IVC findings delivered and placed in patient's chart. CSW called [employer] [facility] to notify him and to arrange transportation for tomorrow. [employer] will pick up patient in the morning and will call nurses station when en route around 9:00AM. NP, MD and RN staff made aware. [facility], [facility].  Trula SladeHeather Smart, MSW, LCSW Clinical Social Worker 09/26/2016 4:35 PM

## 2016-09-27 DIAGNOSIS — I10 Essential (primary) hypertension: Secondary | ICD-10-CM | POA: Diagnosis not present

## 2016-09-27 DIAGNOSIS — F332 Major depressive disorder, recurrent severe without psychotic features: Secondary | ICD-10-CM | POA: Diagnosis not present

## 2016-09-27 DIAGNOSIS — B182 Chronic viral hepatitis C: Secondary | ICD-10-CM | POA: Diagnosis not present

## 2016-09-27 DIAGNOSIS — F10229 Alcohol dependence with intoxication, unspecified: Secondary | ICD-10-CM | POA: Diagnosis not present

## 2016-09-27 DIAGNOSIS — F17200 Nicotine dependence, unspecified, uncomplicated: Secondary | ICD-10-CM | POA: Diagnosis not present

## 2016-09-27 DIAGNOSIS — F1020 Alcohol dependence, uncomplicated: Secondary | ICD-10-CM | POA: Diagnosis not present

## 2016-09-27 DIAGNOSIS — G894 Chronic pain syndrome: Secondary | ICD-10-CM | POA: Diagnosis not present

## 2016-09-27 NOTE — Progress Notes (Signed)
Recreation Therapy Notes  Date: 09/27/16 Time: 0930 Location: [address] Dayroom  Group Topic: Stress Management  Goal Area(s) Addresses:  Patient will verbalize importance of using healthy stress management.  Patient will identify positive emotions associated with healthy stress management.   Intervention: Stress Management  Activity :  Progressive Muscle Relaxation.  LRT introduced the stress management technique of progressive muscle relaxation.  LRT read a script to patients could partake in the activity.  Patients were to follow along as LRT read script.  Education:  Stress Management, Discharge Planning.   Education Outcome: Acknowledges edcuation/In group clarification offered/Needs additional education  Clinical Observations/Feedback: Pt did not attend  group.   Caroll RancherMarjette Damon Baisch, LRT/CTRS         Caroll RancherLindsay, Nylee Barbuto A 09/27/2016 12:38 PM

## 2016-09-27 NOTE — Progress Notes (Signed)
[facility]
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
## 2016-09-27 NOTE — Progress Notes (Signed)
Molly MaduroRobert is prepared for DC as his DC paperwork is reviewed with him via sign language  interpreter who interpret's this writer's instructions for Molly MaduroRobert. All paperwork cc are given to Molly Maduroobert ( AVS, SRA, transition report and pt's suicide safety plan)..in addition to 3 cc each of pt's [place]C [place] that [place] [facility] [place] [place] [facility]_requests and SW cc and gives to him. Pt did complete his morning assessment and on it he wrote he had experienced SI today ( he contracts for safety with this Clinical research associatewriter and says he has these thoughts " all the time". He rated his depression, hopelessness and anxiety " 5/4/4", respectively. Belongings in pt's locker were given to sherriff providing transport and pt shook his head " yes" in response tro this Clinical research associatewriter asking him if he understood dc instructions reveiwed by this Clinical research associatewriter. Pt signed that he we " hoping there would be more things for me to do [facility] because there are more deaf patients there than there are here and he's looking forward to being there". DC completed.                                                                                akes his head " yes" in response to writer asking him if he understands dc direction+

## 2016-09-30 DIAGNOSIS — F1020 Alcohol dependence, uncomplicated: Secondary | ICD-10-CM | POA: Diagnosis not present

## 2016-09-30 DIAGNOSIS — F10229 Alcohol dependence with intoxication, unspecified: Secondary | ICD-10-CM | POA: Diagnosis not present

## 2016-09-30 DIAGNOSIS — F332 Major depressive disorder, recurrent severe without psychotic features: Secondary | ICD-10-CM | POA: Diagnosis not present

## 2016-09-30 DIAGNOSIS — G894 Chronic pain syndrome: Secondary | ICD-10-CM | POA: Diagnosis not present

## 2016-09-30 DIAGNOSIS — F17200 Nicotine dependence, unspecified, uncomplicated: Secondary | ICD-10-CM | POA: Diagnosis not present

## 2016-09-30 DIAGNOSIS — B182 Chronic viral hepatitis C: Secondary | ICD-10-CM | POA: Diagnosis not present

## 2016-09-30 DIAGNOSIS — I10 Essential (primary) hypertension: Secondary | ICD-10-CM | POA: Diagnosis not present

## 2016-10-03 ENCOUNTER — Encounter: Payer: Self-pay | Admitting: *Deleted

## 2016-10-03 ENCOUNTER — Other Ambulatory Visit: Payer: Self-pay | Admitting: *Deleted

## 2016-10-03 ENCOUNTER — Ambulatory Visit: Payer: Self-pay | Admitting: *Deleted

## 2016-10-03 NOTE — Patient Outreach (Signed)
[facility] Care Management  10/03/2016  Melene PlanRobert C Langenderfer 1956/07/11 [id number]   CSW has attempted phone outreach follow up 3 times and will close case.  CSW was able to link patient with resources for transportation initially.   Will advise [facility] team of case closure.   Reece LevyJanet Patti Shorb, MSW, LCSW Clinical Social Worker  [facility] [phone]

## 2016-10-18 DIAGNOSIS — G894 Chronic pain syndrome: Secondary | ICD-10-CM | POA: Diagnosis not present

## 2016-10-18 DIAGNOSIS — F1020 Alcohol dependence, uncomplicated: Secondary | ICD-10-CM | POA: Diagnosis not present

## 2016-10-18 DIAGNOSIS — K5900 Constipation, unspecified: Secondary | ICD-10-CM | POA: Diagnosis not present

## 2016-10-18 DIAGNOSIS — B182 Chronic viral hepatitis C: Secondary | ICD-10-CM | POA: Diagnosis not present

## 2016-10-18 DIAGNOSIS — H409 Unspecified glaucoma: Secondary | ICD-10-CM | POA: Diagnosis not present

## 2016-10-18 DIAGNOSIS — E7800 Pure hypercholesterolemia, unspecified: Secondary | ICD-10-CM | POA: Diagnosis not present

## 2016-10-18 DIAGNOSIS — E559 Vitamin D deficiency, unspecified: Secondary | ICD-10-CM | POA: Diagnosis not present

## 2016-10-18 DIAGNOSIS — I10 Essential (primary) hypertension: Secondary | ICD-10-CM | POA: Diagnosis not present

## 2016-10-18 DIAGNOSIS — F332 Major depressive disorder, recurrent severe without psychotic features: Secondary | ICD-10-CM | POA: Diagnosis not present

## 2016-12-29 DIAGNOSIS — K5900 Constipation, unspecified: Secondary | ICD-10-CM | POA: Diagnosis not present

## 2016-12-29 DIAGNOSIS — B182 Chronic viral hepatitis C: Secondary | ICD-10-CM | POA: Diagnosis not present

## 2016-12-29 DIAGNOSIS — E559 Vitamin D deficiency, unspecified: Secondary | ICD-10-CM | POA: Diagnosis not present

## 2016-12-29 DIAGNOSIS — F332 Major depressive disorder, recurrent severe without psychotic features: Secondary | ICD-10-CM | POA: Diagnosis not present

## 2016-12-29 DIAGNOSIS — E7800 Pure hypercholesterolemia, unspecified: Secondary | ICD-10-CM | POA: Diagnosis not present

## 2016-12-29 DIAGNOSIS — G894 Chronic pain syndrome: Secondary | ICD-10-CM | POA: Diagnosis not present

## 2016-12-29 DIAGNOSIS — H409 Unspecified glaucoma: Secondary | ICD-10-CM | POA: Diagnosis not present

## 2016-12-29 DIAGNOSIS — I10 Essential (primary) hypertension: Secondary | ICD-10-CM | POA: Diagnosis not present

## 2016-12-29 DIAGNOSIS — F1020 Alcohol dependence, uncomplicated: Secondary | ICD-10-CM | POA: Diagnosis not present

## 2017-04-21 IMAGING — CR DG ANKLE COMPLETE 3+V*L*
3 series · 3 of 3 positions shown · non-contrast
Comparison: None.

CLINICAL DATA: Pain after fall while jumping over fence 6 days
prior

EXAM:
LEFT ANKLE COMPLETE - 3+ VIEW

[t ankle joint ap left]
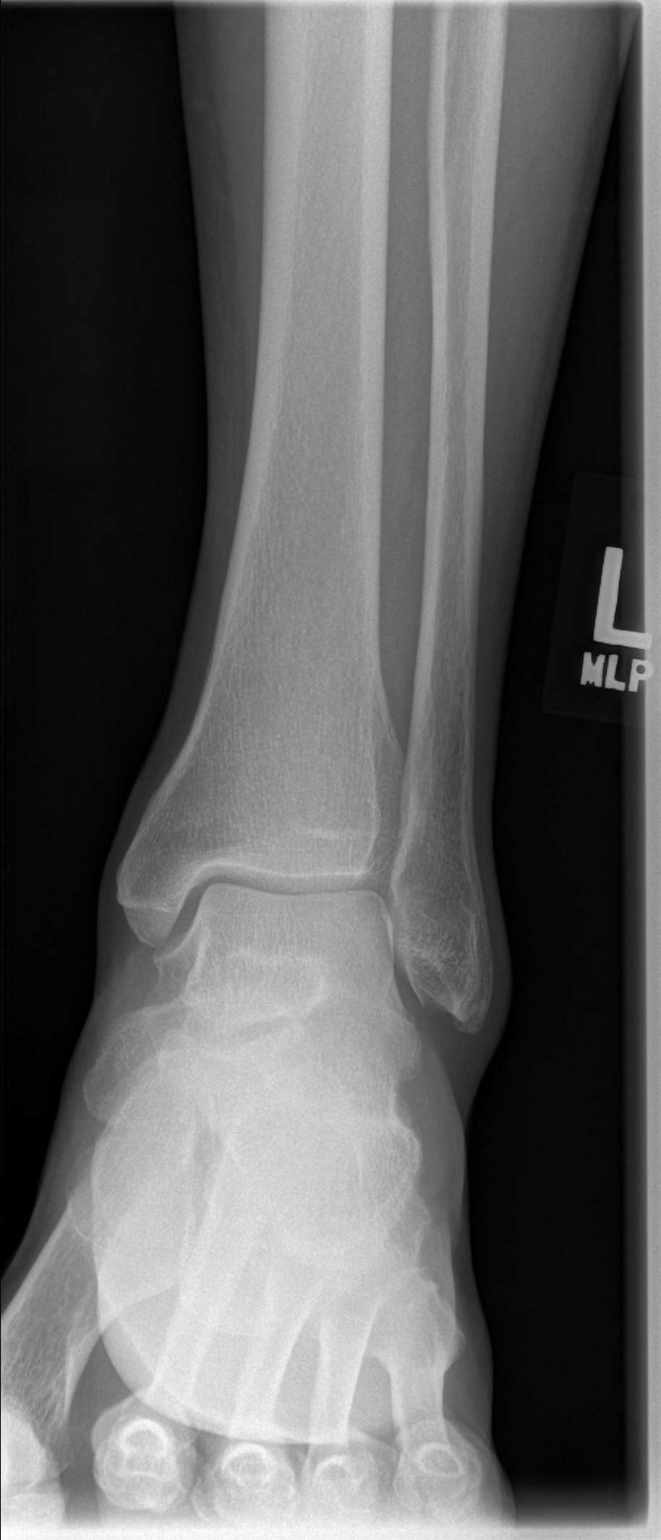

[t ankle joint oblique left]
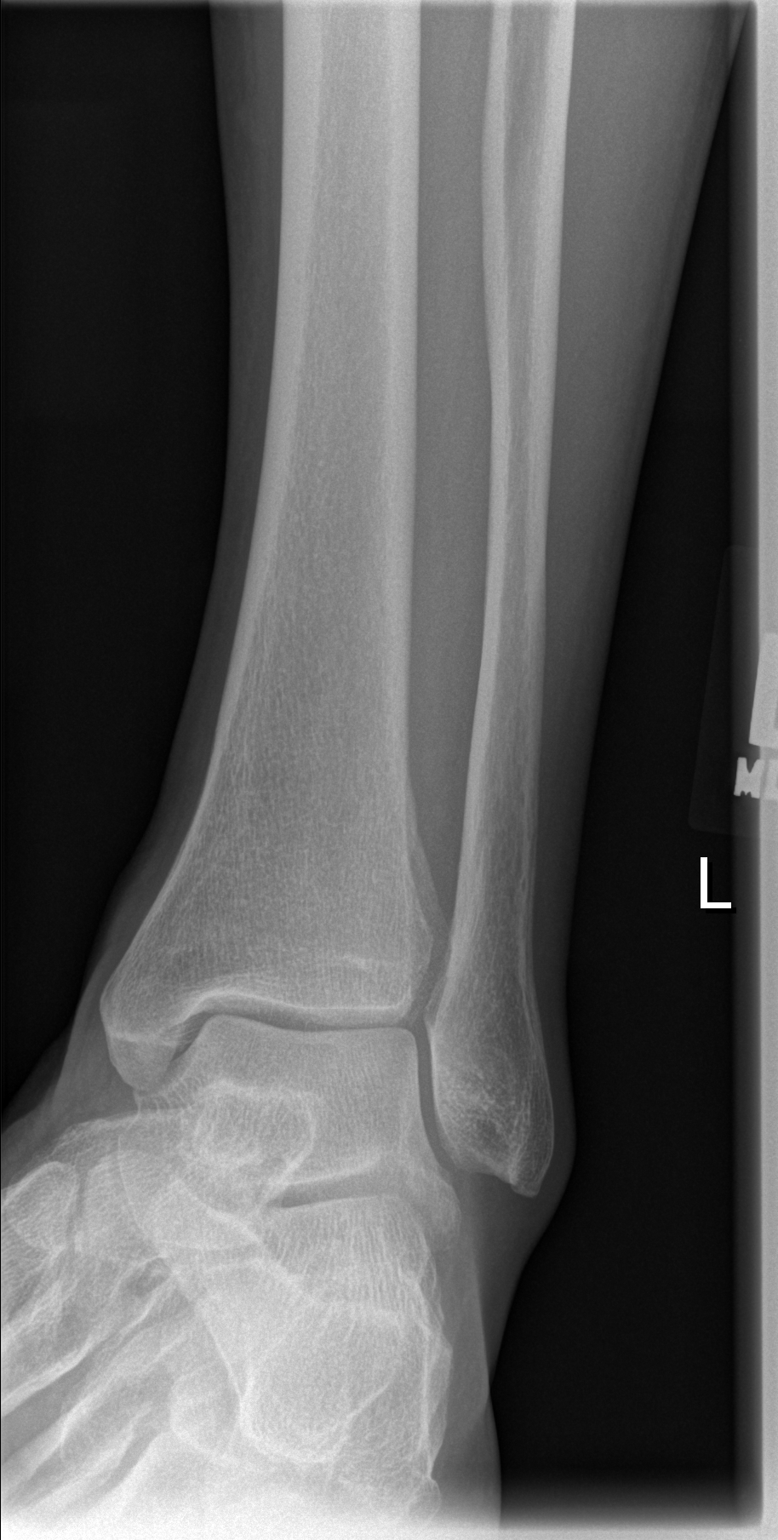

[t ankle joint lat left]
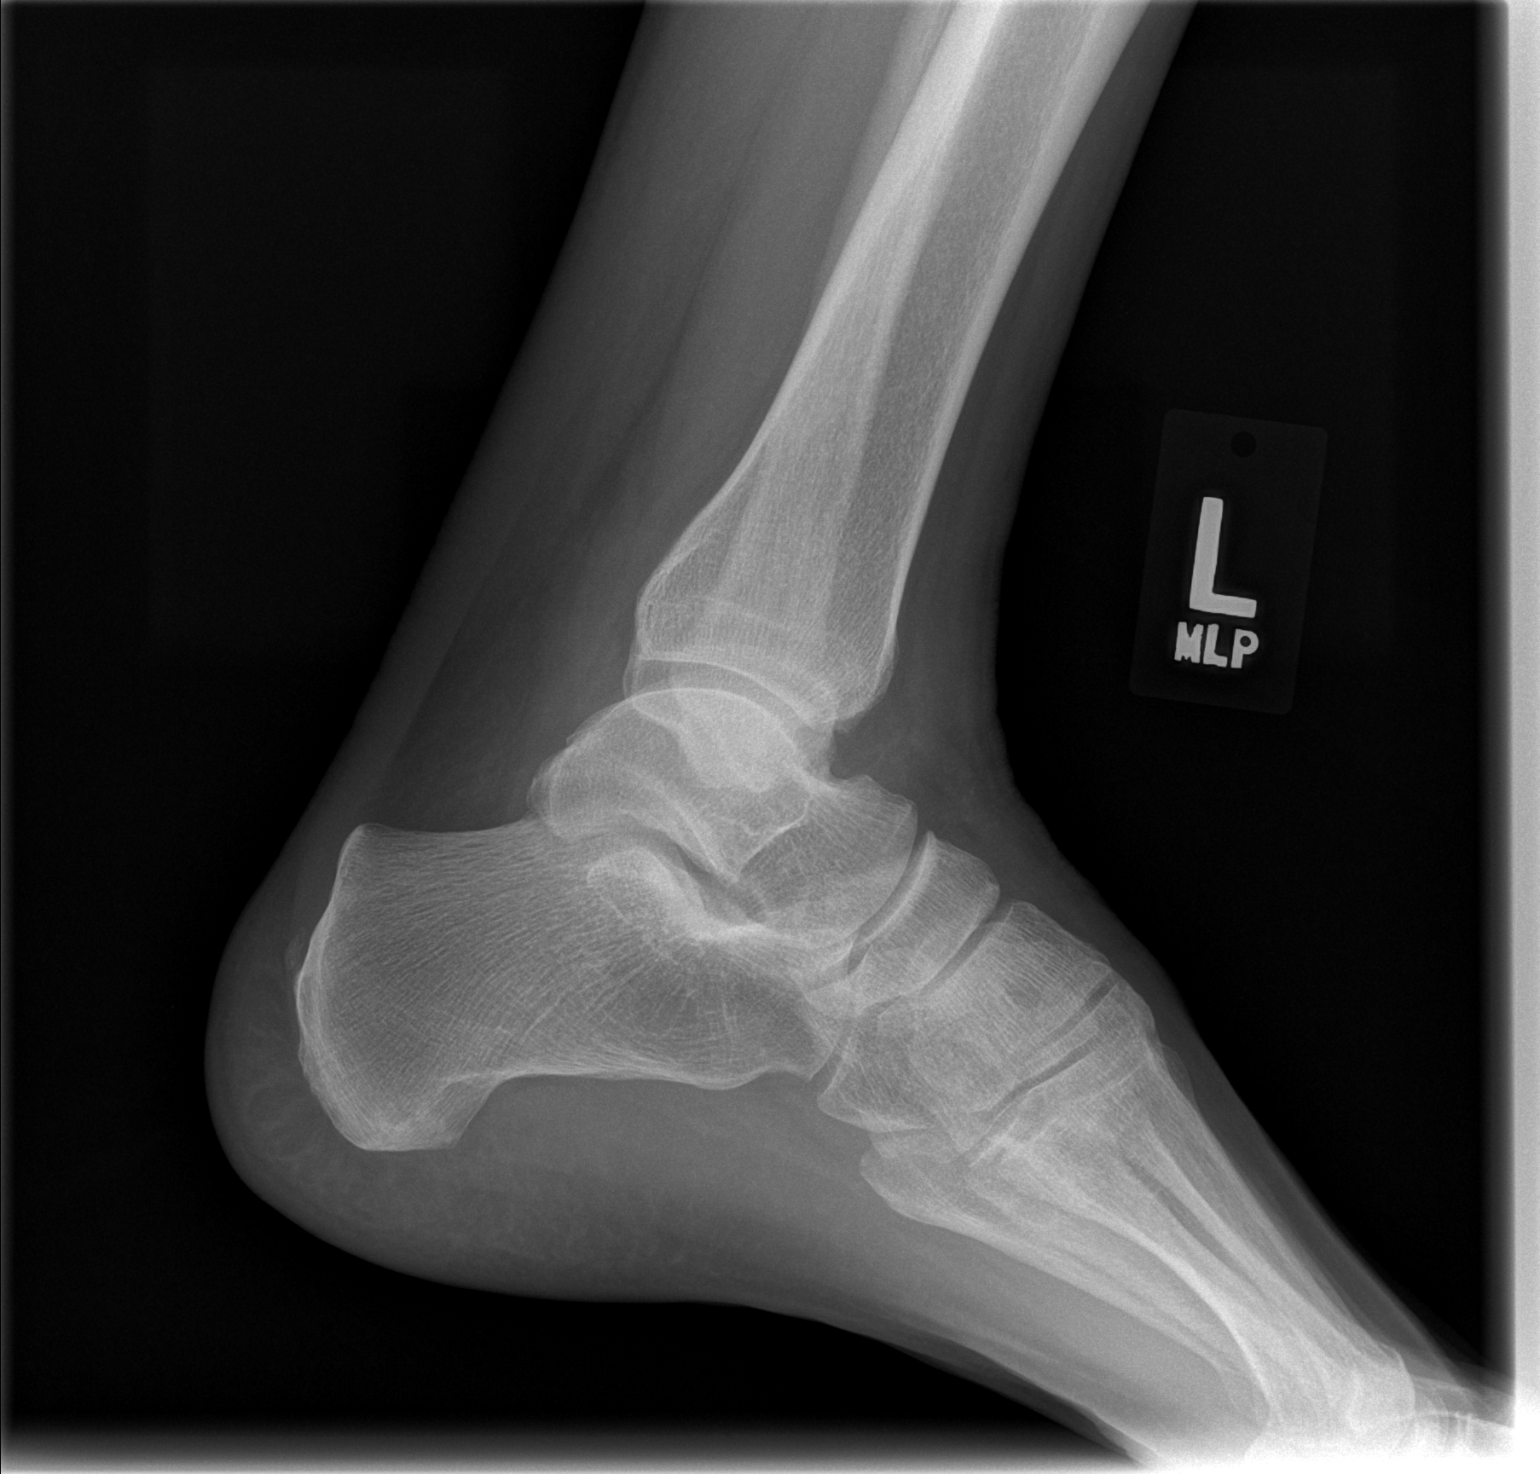

[3 of 3 positions shown; findings below may reference images not displayed]

FINDINGS: Frontal, oblique, and lateral views were obtained. There is no
demonstrable fracture or effusion. The ankle mortise appears intact.
There is no appreciable joint space narrowing. There is mild distal
Achilles tendon calcification.
IMPRESSION: No demonstrable fracture. The ankle mortise appears intact. Mild
distal Achilles tendinosis.

## 2018-04-04 IMAGING — US US ABDOMEN LIMITED
1 series · 14 of 25 positions shown · non-contrast
Comparison: None.

CLINICAL DATA: Right upper quadrant abdominal pain for 2 months.

EXAM:
US ABDOMEN LIMITED - RIGHT UPPER QUADRANT

[Series 1: us abdomen limited · 0.23mm/px · 14 of 55 slices shown]
[im 1/55]
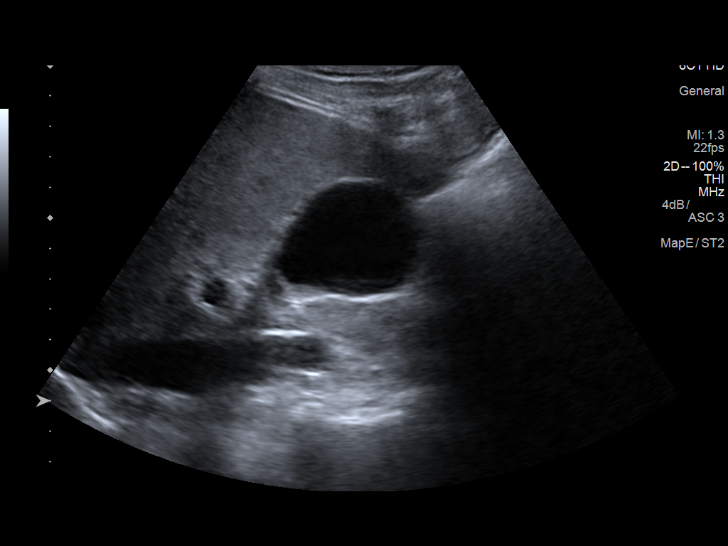
[im 5/55]
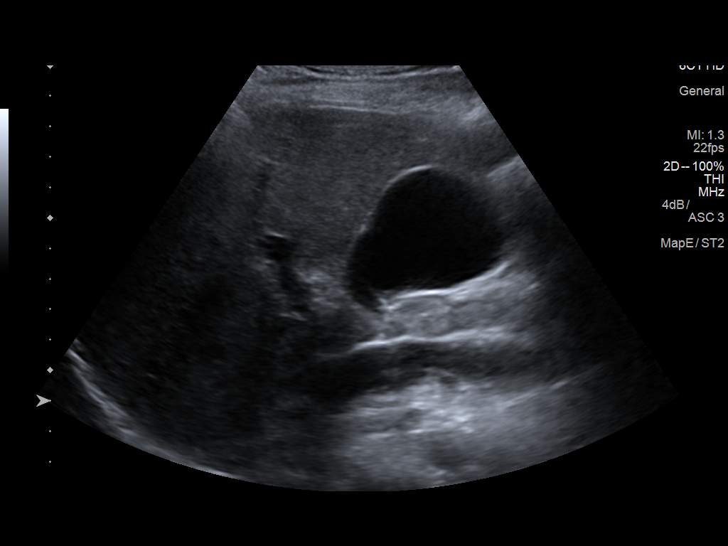
[im 10/55]
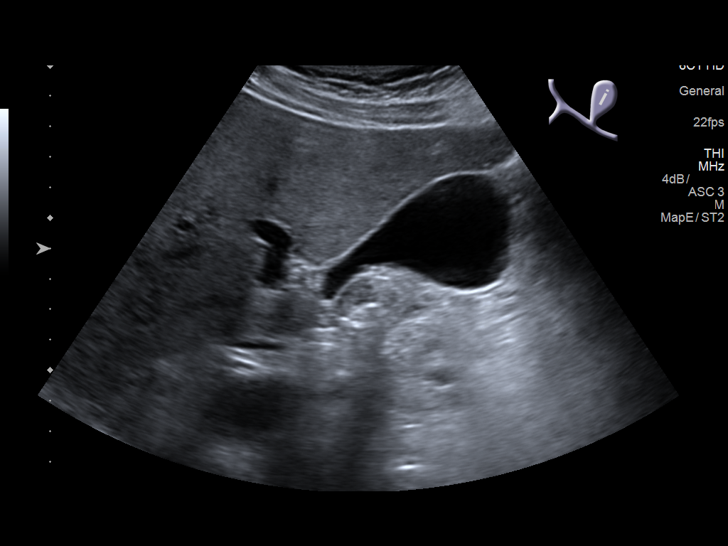
[im 14/55]
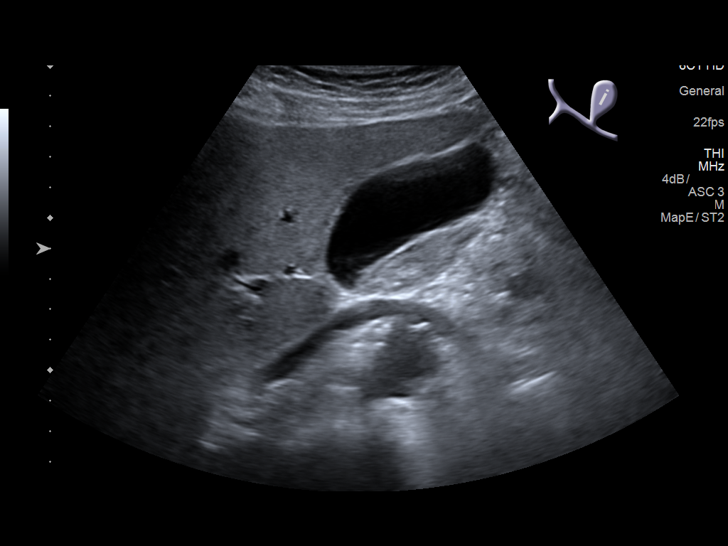
[im 19/55]
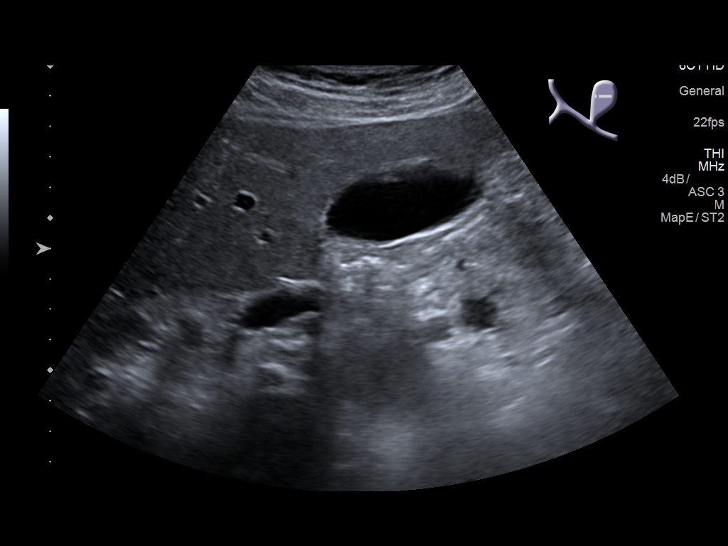
[im 21/55]
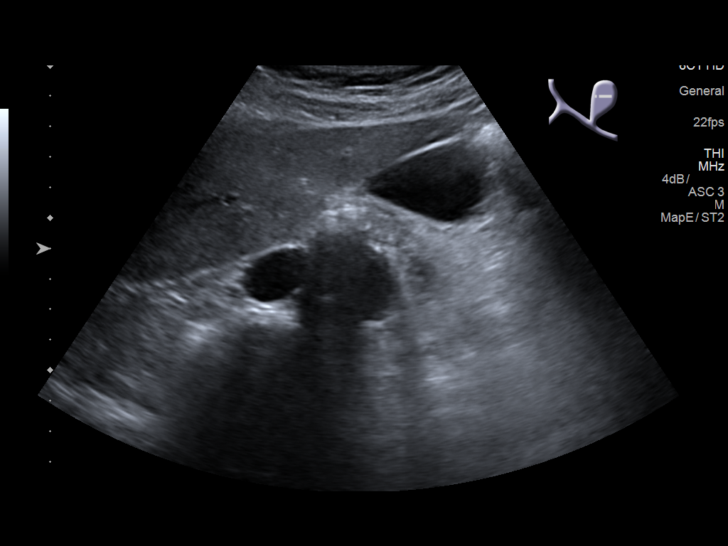
[im 25/55]
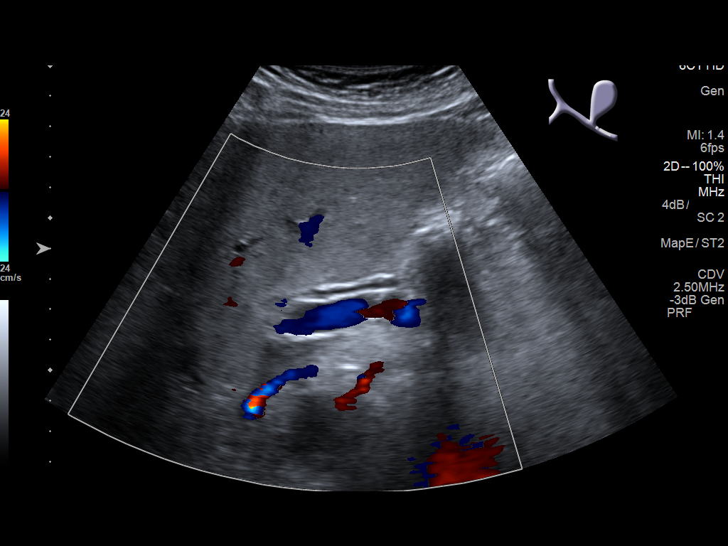
[im 30/55]
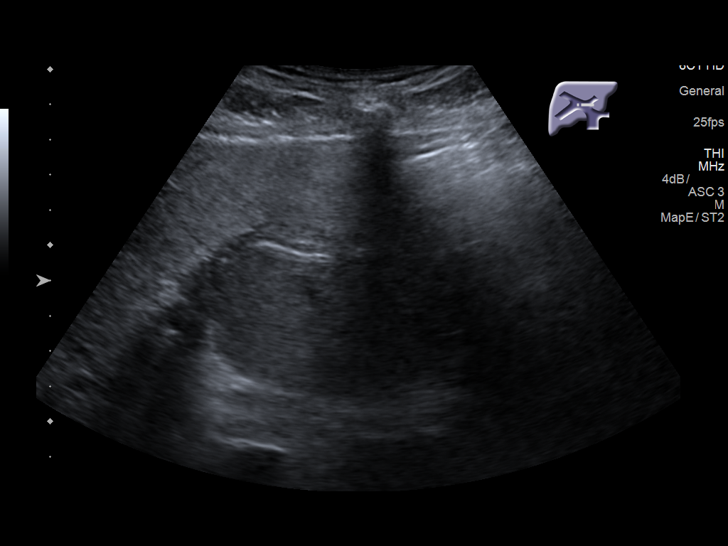
[im 34/55]
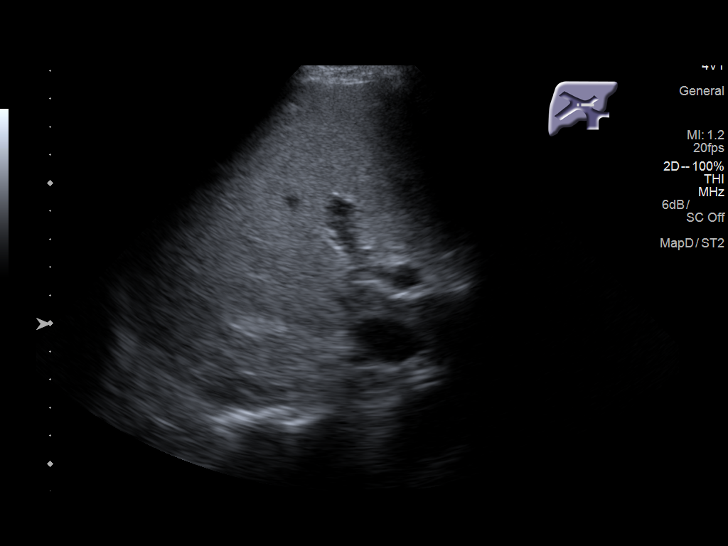
[im 37/55]
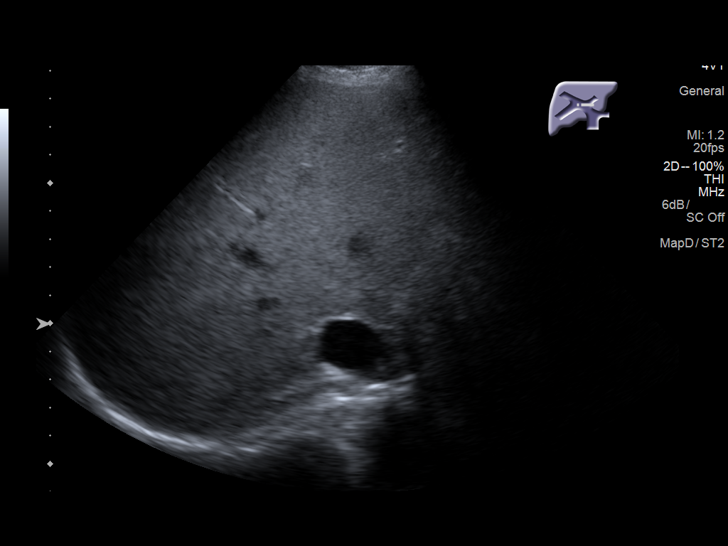
[im 41/55]
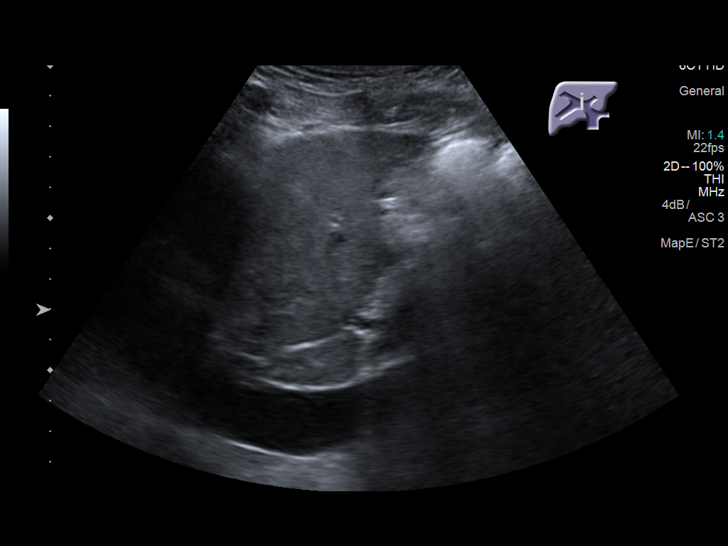
[im 46/55]
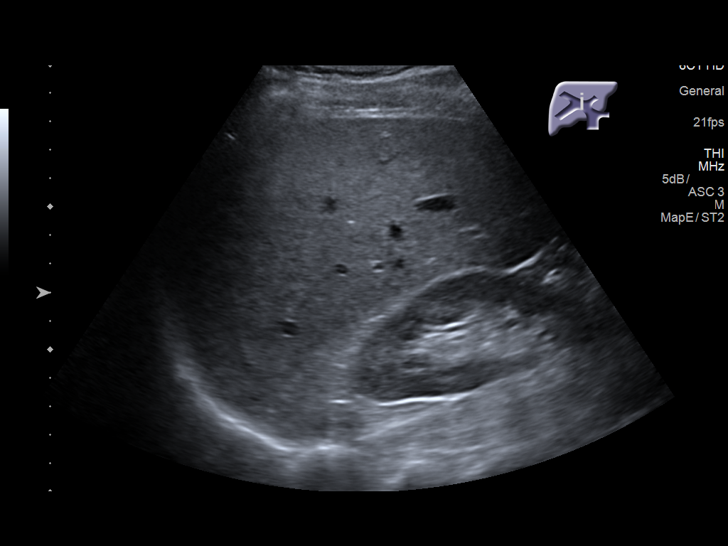
[im 50/55]
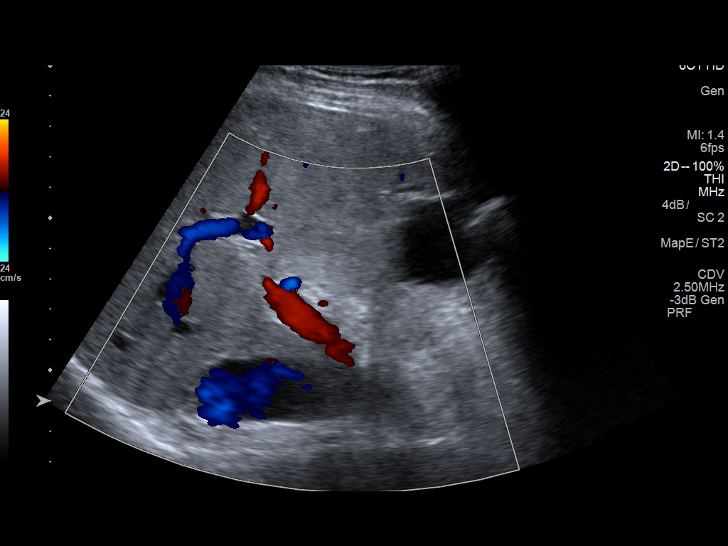
[im 55/55]
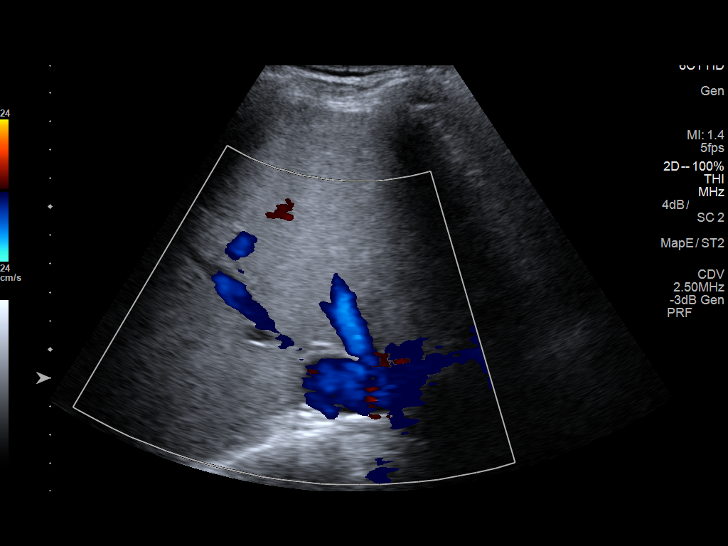

[14 of 25 positions shown; findings below may reference images not displayed]

FINDINGS: Gallbladder:

No gallstones or wall thickening visualized. No sonographic Murphy
sign noted by sonographer.

Common bile duct:

Diameter: 4.1 mm which is within normal limits.

Liver:

No focal lesion identified. Within normal limits in parenchymal
echogenicity.
IMPRESSION: No abnormality seen in the right upper quadrant of the abdomen.

## 2019-06-01 DEATH — deceased
